# Patient Record
Sex: Male | Born: 1955 | Race: White | Hispanic: No | Marital: Married | Smoking: Never smoker
Health system: Southern US, Community
[De-identification: ages and names within clinical notes are randomized; demographics above are authoritative.]

## PROBLEM LIST (undated history)

## (undated) DIAGNOSIS — E119 Type 2 diabetes mellitus without complications: Secondary | ICD-10-CM

## (undated) DIAGNOSIS — Z973 Presence of spectacles and contact lenses: Secondary | ICD-10-CM

## (undated) DIAGNOSIS — T7840XA Allergy, unspecified, initial encounter: Secondary | ICD-10-CM

## (undated) DIAGNOSIS — D649 Anemia, unspecified: Secondary | ICD-10-CM

## (undated) DIAGNOSIS — Z87442 Personal history of urinary calculi: Secondary | ICD-10-CM

## (undated) DIAGNOSIS — M1990 Unspecified osteoarthritis, unspecified site: Secondary | ICD-10-CM

## (undated) DIAGNOSIS — C649 Malignant neoplasm of unspecified kidney, except renal pelvis: Secondary | ICD-10-CM

## (undated) DIAGNOSIS — E785 Hyperlipidemia, unspecified: Secondary | ICD-10-CM

## (undated) DIAGNOSIS — M5136 Other intervertebral disc degeneration, lumbar region: Secondary | ICD-10-CM

## (undated) DIAGNOSIS — K760 Fatty (change of) liver, not elsewhere classified: Secondary | ICD-10-CM

## (undated) HISTORY — DX: Unspecified osteoarthritis, unspecified site: M19.90

## (undated) HISTORY — DX: Hyperlipidemia, unspecified: E78.5

## (undated) HISTORY — DX: Other intervertebral disc degeneration, lumbar region: M51.36

## (undated) HISTORY — DX: Personal history of urinary calculi: Z87.442

## (undated) HISTORY — PX: COLONOSCOPY: SHX174

## (undated) HISTORY — DX: Allergy, unspecified, initial encounter: T78.40XA

## (undated) HISTORY — DX: Anemia, unspecified: D64.9

## (undated) HISTORY — PX: KNEE SURGERY: SHX244

## (undated) HISTORY — DX: Fatty (change of) liver, not elsewhere classified: K76.0

## (undated) HISTORY — DX: Type 2 diabetes mellitus without complications: E11.9

## (undated) HISTORY — DX: Malignant neoplasm of unspecified kidney, except renal pelvis: C64.9

---

## 2007-07-09 DIAGNOSIS — C649 Malignant neoplasm of unspecified kidney, except renal pelvis: Secondary | ICD-10-CM

## 2007-07-09 HISTORY — PX: PARTIAL NEPHRECTOMY: SHX414

## 2007-07-09 HISTORY — DX: Malignant neoplasm of unspecified kidney, except renal pelvis: C64.9

## 2008-07-08 DIAGNOSIS — E119 Type 2 diabetes mellitus without complications: Secondary | ICD-10-CM

## 2008-07-08 HISTORY — DX: Type 2 diabetes mellitus without complications: E11.9

## 2010-07-08 LAB — HM COLONOSCOPY

## 2013-06-02 ENCOUNTER — Telehealth: Payer: Self-pay | Admitting: Family Medicine

## 2013-06-02 NOTE — Telephone Encounter (Signed)
Pt is new to the area and wants to est w/you as his PCP.  He needs an appointment after 5:00 if at all possible.  Can you accommodate him a new patient appointment after 5:00 p.m.? Thank you.

## 2013-06-06 NOTE — Telephone Encounter (Signed)
Ok to place in any open slot Thursday after 5pm.  Thanks.

## 2013-06-07 NOTE — Telephone Encounter (Signed)
Called and left mssg for pt to return my call

## 2013-06-09 NOTE — Telephone Encounter (Signed)
Scheduled for 06/24/2013

## 2013-06-09 NOTE — Telephone Encounter (Signed)
Called and left another mssg for pt to return call

## 2013-06-24 ENCOUNTER — Encounter: Payer: Self-pay | Admitting: Family Medicine

## 2013-06-24 ENCOUNTER — Ambulatory Visit (INDEPENDENT_AMBULATORY_CARE_PROVIDER_SITE_OTHER): Payer: BC Managed Care – PPO | Admitting: Family Medicine

## 2013-06-24 VITALS — BP 136/88 | HR 68 | Temp 98.1°F | Ht 67.0 in | Wt 204.5 lb

## 2013-06-24 DIAGNOSIS — E1140 Type 2 diabetes mellitus with diabetic neuropathy, unspecified: Secondary | ICD-10-CM | POA: Insufficient documentation

## 2013-06-24 DIAGNOSIS — C641 Malignant neoplasm of right kidney, except renal pelvis: Secondary | ICD-10-CM

## 2013-06-24 DIAGNOSIS — E1169 Type 2 diabetes mellitus with other specified complication: Secondary | ICD-10-CM | POA: Insufficient documentation

## 2013-06-24 DIAGNOSIS — C649 Malignant neoplasm of unspecified kidney, except renal pelvis: Secondary | ICD-10-CM

## 2013-06-24 DIAGNOSIS — E785 Hyperlipidemia, unspecified: Secondary | ICD-10-CM

## 2013-06-24 DIAGNOSIS — E119 Type 2 diabetes mellitus without complications: Secondary | ICD-10-CM

## 2013-06-24 DIAGNOSIS — IMO0002 Reserved for concepts with insufficient information to code with codable children: Secondary | ICD-10-CM | POA: Insufficient documentation

## 2013-06-24 DIAGNOSIS — Z87442 Personal history of urinary calculi: Secondary | ICD-10-CM | POA: Insufficient documentation

## 2013-06-24 DIAGNOSIS — Z85528 Personal history of other malignant neoplasm of kidney: Secondary | ICD-10-CM | POA: Insufficient documentation

## 2013-06-24 NOTE — Assessment & Plan Note (Signed)
Chronic, seems stable.  Foot exam today. May be due for eye exam. Pt interested in DMSE - will refer. Reviewed healthy diet choices. Return in 4 mo for f/u.

## 2013-06-24 NOTE — Patient Instructions (Signed)
Blood work today. We will call you to set up diabetes education classes. Good to see you today, call us with questions. Return as needed or in 4 months for follow up diabetes.

## 2013-06-24 NOTE — Assessment & Plan Note (Signed)
Did not tolerate lipitor - will check FLP today and discuss other treatment options.

## 2013-06-24 NOTE — Assessment & Plan Note (Signed)
On allopurinol 

## 2013-06-24 NOTE — Assessment & Plan Note (Signed)
Continue to follow with urology.  Pt has appt 07/06/2013 back in [place].  I have asked him to send me fax of last urology office note.

## 2013-06-24 NOTE — Progress Notes (Signed)
Subjective:    Patient ID: Blake Manning, male    DOB: 13-Jul-1955, 57 y.o.   MRN: [id number]  HPI CC: new pt to establish  Recently relocated to area from [facility]. Prior PCP Dr. Pennie Rushing in [place].  Renal cancer - sees urologist Dr. Luellen Pucker in [place], [place].  Dx 2009 s/p partial right nephrectomy without residual issues.  5 yr appt is later this year.    T2DM - unsure what last A1c was.  Last check was 1 month ago.  Last foot exam several months ago.  Last eye exam 2013.  Compliant with januvmet bid.  Checks sugars intermittently (once a week - last check was fasting 120-130).  + parsethesias in feet.  lyrica caused body soreness.  HLD - lipitor caused bone aches.  H/o kidney stone - last stone 2009.  Has residual kidney stones in right side.  On allopurinol to help decrease risk of recurrence  Preventative: Late 2014. Colonoscopy 2012 Flu shot - today. Tetanus shot - 2011.  Lives with wife, 1 dog Grown children Occupation: Retail banker Edu: HS, going to join local gym Diet: good water, fruits/vegetables daily  Medications and allergies reviewed and updated in chart.  Past histories reviewed and updated if relevant as below. There are no active problems to display for this patient.  Past Medical History  Diagnosis Date  . Renal cancer 2009    part of R kidney removed  . Diabetes type 2, controlled 2010  . Hyperlipidemia   . History of kidney stones    Past Surgical History  Procedure Laterality Date  . Partial nephrectomy Right 2009   History  Substance Use Topics  . Smoking status: Never Smoker   . Smokeless tobacco: Never Used  . Alcohol Use: No   Family History  Problem Relation Age of Onset  . Cancer Mother     lung (smoker)  . Diabetes Mother   . Cancer Maternal Aunt     lung (smoker)  . Cancer Maternal Aunt     lung with met to brain (smoker)  . Valvular heart disease Son 30    extra heart valves s/p recent procedure  .  Stroke Neg Hx   . Hypertension Son   . Hyperlipidemia Son    Allergies  Allergen Reactions  . Lipitor [Atorvastatin] Other (See Comments)    Bone aches  . Codeine Palpitations   No current outpatient prescriptions on file prior to visit.   No current facility-administered medications on file prior to visit.    Review of Systems Per HPI    Objective:   Physical Exam  Nursing note and vitals reviewed. Constitutional: He is oriented to person, place, and time. He appears well-developed and well-nourished. No distress.  HENT:  Head: Normocephalic and atraumatic.  Nose: Nose normal.  Mouth/Throat: Oropharynx is clear and moist. No oropharyngeal exudate.  Eyes: Conjunctivae and EOM are normal. Pupils are equal, round, and reactive to light. No scleral icterus.  Neck: Normal range of motion. Neck supple.  Cardiovascular: Normal rate, regular rhythm, normal heart sounds and intact distal pulses.   No murmur heard. Pulses:      Radial pulses are 2+ on the right side, and 2+ on the left side.  Pulmonary/Chest: Effort normal and breath sounds normal. No respiratory distress. He has no wheezes. He has no rales.  Abdominal: Soft. Bowel sounds are normal. He exhibits no distension and no mass. There is no tenderness. There is no rebound and no guarding.  Musculoskeletal: Normal range of motion. He exhibits no edema.  Diabetic foot exam: Normal inspection No skin breakdown Early calluses on right  Normal DP/PT pulses Normal sensation to monofilament slightly decreased to light touch on right great toe Nails normal  Lymphadenopathy:    He has no cervical adenopathy.  Neurological: He is alert and oriented to person, place, and time.  CN grossly intact, station and gait intact  Skin: Skin is warm and dry. No rash noted.  Psychiatric: He has a normal mood and affect. His behavior is normal. Judgment and thought content normal.        Assessment & Plan:

## 2013-06-24 NOTE — Progress Notes (Signed)
Pre-visit discussion using our clinic review tool. No additional management support is needed unless otherwise documented below in the visit note.  

## 2013-06-25 LAB — BASIC METABOLIC PANEL
BUN: 13 mg/dL (ref 6–23)
CO2: 24 mEq/L (ref 19–32)
Calcium: 9.5 mg/dL (ref 8.4–10.5)
Chloride: 105 mEq/L (ref 96–112)
Creatinine, Ser: 0.8 mg/dL (ref 0.4–1.5)
GFR: 102.16 mL/min (ref >60.00)
Glucose, Bld: 124 mg/dL — ABNORMAL HIGH (ref 70–99)
Potassium: 3.7 mEq/L (ref 3.5–5.1)
Sodium: 138 mEq/L (ref 135–145)

## 2013-06-25 LAB — LIPID PANEL
Cholesterol: 209 mg/dL — ABNORMAL HIGH (ref 0–200)
HDL: 44 mg/dL (ref >39.00)
Total CHOL/HDL Ratio: 5
Triglycerides: 295 mg/dL — ABNORMAL HIGH (ref 0.0–149.0)
VLDL: 59 mg/dL — ABNORMAL HIGH (ref 0.0–40.0)

## 2013-06-25 LAB — LDL CHOLESTEROL, DIRECT: Direct LDL: 131.5 mg/dL

## 2013-06-25 LAB — HEMOGLOBIN A1C: Hgb A1c MFr Bld: 8.1 % — ABNORMAL HIGH (ref 4.6–6.5)

## 2013-06-30 ENCOUNTER — Other Ambulatory Visit: Payer: Self-pay | Admitting: Family Medicine

## 2013-06-30 MED ORDER — PRAVASTATIN SODIUM 40 MG PO TABS: 40.0000 mg | ORAL_TABLET | Freq: Every day | ORAL | Status: DC

## 2013-08-26 ENCOUNTER — Telehealth: Payer: Self-pay | Admitting: *Deleted

## 2013-08-26 NOTE — Telephone Encounter (Signed)
PA for test strips on your IN box for completion.

## 2013-08-27 ENCOUNTER — Telehealth: Payer: Self-pay

## 2013-08-27 MED ORDER — GLUCOSE BLOOD VI STRP: ORAL_STRIP | Status: DC

## 2013-08-27 NOTE — Telephone Encounter (Signed)
signed and placed in my out box 

## 2013-08-27 NOTE — Telephone Encounter (Signed)
Pt request new rx for contour test strips to [facility]. Dr Darnell Level gave verbal order to add to med list and refill. Mrs [facility] notified done.

## 2013-08-28 ENCOUNTER — Encounter: Payer: Self-pay | Admitting: Family Medicine

## 2013-09-03 NOTE — Telephone Encounter (Signed)
I called ins to check the status on PA. Was advised that test strips were approved . Case ID [id number]. PA forms sent to be scanned, and parmacy notified via fax.

## 2013-09-07 ENCOUNTER — Telehealth: Payer: Self-pay

## 2013-09-07 NOTE — Telephone Encounter (Signed)
Ok to send in. Thanks

## 2013-09-07 NOTE — Telephone Encounter (Signed)
Pt left note; insurance will no longer cover contour diabetic supplies; pt request  accu-chek comfort curv a long with test strips to [facility].Please advise.

## 2013-09-08 ENCOUNTER — Other Ambulatory Visit: Payer: Self-pay | Admitting: *Deleted

## 2013-09-08 MED ORDER — GLUCOSE BLOOD VI STRP: ORAL_STRIP | Status: DC

## 2013-09-08 MED ORDER — BLOOD GLUCOSE METER KIT: PACK | Status: DC

## 2013-09-08 NOTE — Telephone Encounter (Signed)
Sent in as directed.

## 2013-09-14 ENCOUNTER — Encounter: Payer: BC Managed Care – PPO | Admitting: Family Medicine

## 2013-09-21 ENCOUNTER — Ambulatory Visit (INDEPENDENT_AMBULATORY_CARE_PROVIDER_SITE_OTHER): Payer: BC Managed Care – PPO | Admitting: Family Medicine

## 2013-09-21 VITALS — BP 142/88 | HR 76 | Temp 97.7°F | Ht 67.0 in | Wt 202.4 lb

## 2013-09-21 DIAGNOSIS — E119 Type 2 diabetes mellitus without complications: Secondary | ICD-10-CM

## 2013-09-21 DIAGNOSIS — C649 Malignant neoplasm of unspecified kidney, except renal pelvis: Secondary | ICD-10-CM

## 2013-09-21 DIAGNOSIS — E785 Hyperlipidemia, unspecified: Secondary | ICD-10-CM

## 2013-09-21 DIAGNOSIS — Z0000 Encounter for general adult medical examination without abnormal findings: Secondary | ICD-10-CM | POA: Insufficient documentation

## 2013-09-21 NOTE — Assessment & Plan Note (Signed)
Preventative protocols reviewed and updated unless pt declined. Discussed healthy diet and lifestyle.  

## 2013-09-21 NOTE — Patient Instructions (Addendum)
Look for red yeast rice over the counter for cholesterol levels.  Start at 600mg  once daily.  If tolerating well may increase to 600mg  twice daily. Return Friday morning fasting for blood work - black coffee ok or water. Good to see you today, call us with questions. We'll give you a call with A1c and diabetes plan.

## 2013-09-21 NOTE — Progress Notes (Signed)
Pre visit review using our clinic review tool, if applicable. No additional management support is needed unless otherwise documented below in the visit note. 

## 2013-09-21 NOTE — Assessment & Plan Note (Signed)
Did not tolerate lipitor or pravastatin. Check FLP again ,recommend trial of RYR.

## 2013-09-21 NOTE — Assessment & Plan Note (Signed)
Chronic, recheck A1c when returns fasting for blood work. Did not keep appt with DSME.

## 2013-09-21 NOTE — Assessment & Plan Note (Signed)
Stable, followed by urology.  

## 2013-09-21 NOTE — Progress Notes (Signed)
BP 142/88  Pulse 76  Temp(Src) 97.7 F (36.5 C) (Oral)  Ht _0  (1.702 m)  Wt 202 lb 6.4 oz (91.808 kg)  BMI 31.69 kg/m2   CC: annual exam  Subjective:    Patient ID: Blake Manning, male    DOB: [birth year], 58 y.o.   MRN: [id number]  HPI: Blake Manning is a 58 y.o. male presenting on 09/21/2013 for Annual Exam   HLD - did not tolerate lipitor or pravastatin - body aches.  DM - A1c again today.  compliant with janumet 50/500 bid.  Checks sugars fasting daily.  Sugars averaging 160-180. Lab Results  Component Value Date   HGBA1C 8.1* 06/24/2013    Seat belt use discussed Sunscreen use discussed, no sunburns in past year.  Preventative: Colonoscopy 2012  Prostate cancer screening - done by urologist who he sees for h/o kidney cancer. Flu shot - did not receive Tetanus shot - 2011.  Pneumovax - today.  Lives with wife, 1 dog  Grown children  Occupation: Armed forces technical officer  Edu: HS Activity: going to join local gym Diet: good water, fruits/vegetables daily   Relevant past medical, surgical, family and social history reviewed and updated as indicated.  Allergies and medications reviewed and updated. Current Outpatient Prescriptions on File Prior to Visit  Medication Sig  . allopurinol (ZYLOPRIM) 300 MG tablet Take 300 mg by mouth daily.  . Blood Glucose Monitoring Suppl (BLOOD GLUCOSE METER) kit Accu-Chek Comfort Curve Dx: 250.00  Use to check sugar once daily  . glucose blood (ACCU-CHEK COMFORT CURVE) test strip Use to check blood sugar once daily. Dx: 250.00  . sitaGLIPtin-metformin (JANUMET) 50-500 MG per tablet Take 1 tablet by mouth 2 (two) times daily with a meal.   No current facility-administered medications on file prior to visit.    Review of Systems  Constitutional: Negative for fever, chills, activity change, appetite change, fatigue and unexpected weight change.  HENT: Negative for hearing loss.   Eyes: Negative for visual disturbance.    Respiratory: Negative for cough, chest tightness, shortness of breath and wheezing.   Cardiovascular: Negative for chest pain, palpitations and leg swelling.  Gastrointestinal: Negative for nausea, vomiting, abdominal pain, diarrhea, constipation, blood in stool and abdominal distention.  Genitourinary: Negative for hematuria and difficulty urinating.  Musculoskeletal: Negative for arthralgias, myalgias and neck pain.  Skin: Negative for rash.  Neurological: Negative for dizziness, seizures, syncope and headaches.  Hematological: Negative for adenopathy. Does not bruise/bleed easily.  Psychiatric/Behavioral: Negative for dysphoric mood. The patient is not nervous/anxious.    Per HPI unless specifically indicated above    Objective:    BP 142/88  Pulse 76  Temp(Src) 97.7 F (36.5 C) (Oral)  Ht _1  (1.702 m)  Wt 202 lb 6.4 oz (91.808 kg)  BMI 31.69 kg/m2  Physical Exam  Nursing note and vitals reviewed. Constitutional: He is oriented to person, place, and time. He appears well-developed and well-nourished. No distress.  HENT:  Head: Normocephalic and atraumatic.  Mouth/Throat: Uvula is midline, oropharynx is clear and moist and mucous membranes are normal. No oropharyngeal exudate, posterior oropharyngeal edema or posterior oropharyngeal erythema.  Eyes: Conjunctivae and EOM are normal. Pupils are equal, round, and reactive to light. No scleral icterus.  Neck: Normal range of motion. Neck supple. No thyromegaly present.  Cardiovascular: Normal rate, regular rhythm, normal heart sounds and intact distal pulses.   No murmur heard. Pulses:      Radial pulses are 2+ on the right side, and  2+ on the left side.  Pulmonary/Chest: Effort normal and breath sounds normal. No respiratory distress. He has no wheezes. He has no rales.  Abdominal: Soft. Bowel sounds are normal. He exhibits no distension and no mass. There is no tenderness. There is no rebound and no guarding.  Genitourinary:   Per urology  Musculoskeletal: Normal range of motion. He exhibits no edema.  Lymphadenopathy:    He has no cervical adenopathy.  Neurological: He is alert and oriented to person, place, and time.  CN grossly intact, station and gait intact  Skin: Skin is warm and dry. No rash noted.  Psychiatric: He has a normal mood and affect. His behavior is normal. Judgment and thought content normal.   Results for orders placed in visit on 06/24/13  LIPID PANEL      Result Value Ref Range   Cholesterol 209 (*) 0 - 200 mg/dL   Triglycerides 295.0 (*) 0.0 - 149.0 mg/dL   HDL 44.00  >39.00 mg/dL   VLDL 59.0 (*) 0.0 - 40.0 mg/dL   Total CHOL/HDL Ratio 5    BASIC METABOLIC PANEL      Result Value Ref Range   Sodium 138  135 - 145 mEq/L   Potassium 3.7  3.5 - 5.1 mEq/L   Chloride 105  96 - 112 mEq/L   CO2 24  19 - 32 mEq/L   Glucose, Bld 124 (*) 70 - 99 mg/dL   BUN 13  6 - 23 mg/dL   Creatinine, Ser 0.8  0.4 - 1.5 mg/dL   Calcium 9.5  8.4 - 10.5 mg/dL   GFR 102.16  >60.00 mL/min  HEMOGLOBIN A1C      Result Value Ref Range   Hemoglobin A1C 8.1 (*) 4.6 - 6.5 %  LDL CHOLESTEROL, DIRECT      Result Value Ref Range   Direct LDL 131.5        Assessment & Plan:   Problem List Items Addressed This Visit   Diabetes type 2, controlled     Chronic, recheck A1c when returns fasting for blood work. Did not keep appt with DSME.    Relevant Orders      Hemoglobin A1c      Basic metabolic panel   Health maintenance examination - Primary     Preventative protocols reviewed and updated unless pt declined. Discussed healthy diet and lifestyle.     Relevant Orders      Nicotine/cotinine metabolites   Hyperlipidemia     Did not tolerate lipitor or pravastatin. Check FLP again ,recommend trial of RYR.    Relevant Orders      Lipid panel   Renal cancer     Stable, followed by urology.        Follow up plan: Return in about 4 months (around 01/21/2014), or as needed, for follow up, follow up  visit.

## 2013-09-23 ENCOUNTER — Encounter: Payer: Self-pay | Admitting: Family Medicine

## 2013-09-24 ENCOUNTER — Other Ambulatory Visit (INDEPENDENT_AMBULATORY_CARE_PROVIDER_SITE_OTHER): Payer: BC Managed Care – PPO

## 2013-09-24 DIAGNOSIS — E785 Hyperlipidemia, unspecified: Secondary | ICD-10-CM

## 2013-09-24 DIAGNOSIS — E119 Type 2 diabetes mellitus without complications: Secondary | ICD-10-CM

## 2013-09-24 DIAGNOSIS — Z0000 Encounter for general adult medical examination without abnormal findings: Secondary | ICD-10-CM

## 2013-09-24 LAB — BASIC METABOLIC PANEL
BUN: 12 mg/dL (ref 6–23)
CO2: 28 mEq/L (ref 19–32)
Calcium: 9.6 mg/dL (ref 8.4–10.5)
Chloride: 102 mEq/L (ref 96–112)
Creatinine, Ser: 0.9 mg/dL (ref 0.4–1.5)
GFR: 92.96 mL/min (ref >60.00)
Glucose, Bld: 170 mg/dL — ABNORMAL HIGH (ref 70–99)
Potassium: 4.1 mEq/L (ref 3.5–5.1)
Sodium: 137 mEq/L (ref 135–145)

## 2013-09-24 LAB — LIPID PANEL
Cholesterol: 169 mg/dL (ref 0–200)
HDL: 47.1 mg/dL (ref >39.00)
LDL Cholesterol: 97 mg/dL (ref 0–99)
Total CHOL/HDL Ratio: 4
Triglycerides: 126 mg/dL (ref 0.0–149.0)
VLDL: 25.2 mg/dL (ref 0.0–40.0)

## 2013-09-24 LAB — HEMOGLOBIN A1C: Hgb A1c MFr Bld: 8.8 % — ABNORMAL HIGH (ref 4.6–6.5)

## 2013-09-27 LAB — NICOTINE/COTININE METABOLITES: Cotinine: <10 ng/mL

## 2013-09-30 ENCOUNTER — Telehealth: Payer: Self-pay

## 2013-09-30 NOTE — Telephone Encounter (Signed)
Relevant patient education assigned to patient using Emmi. ° °

## 2013-10-04 ENCOUNTER — Other Ambulatory Visit: Payer: Self-pay | Admitting: Family Medicine

## 2013-10-04 MED ORDER — GLIMEPIRIDE 1 MG PO TABS: 1.0000 mg | ORAL_TABLET | Freq: Every day | ORAL | Status: DC

## 2013-10-11 ENCOUNTER — Encounter: Payer: Self-pay | Admitting: Family Medicine

## 2013-12-08 ENCOUNTER — Encounter: Payer: Self-pay | Admitting: Family Medicine

## 2014-01-05 DIAGNOSIS — K760 Fatty (change of) liver, not elsewhere classified: Secondary | ICD-10-CM

## 2014-01-05 DIAGNOSIS — M51369 Other intervertebral disc degeneration, lumbar region without mention of lumbar back pain or lower extremity pain: Secondary | ICD-10-CM

## 2014-01-05 DIAGNOSIS — M5136 Other intervertebral disc degeneration, lumbar region: Secondary | ICD-10-CM

## 2014-01-05 HISTORY — DX: Other intervertebral disc degeneration, lumbar region: M51.36

## 2014-01-05 HISTORY — DX: Fatty (change of) liver, not elsewhere classified: K76.0

## 2014-01-05 HISTORY — DX: Other intervertebral disc degeneration, lumbar region without mention of lumbar back pain or lower extremity pain: M51.369

## 2014-01-14 ENCOUNTER — Telehealth: Payer: Self-pay | Admitting: *Deleted

## 2014-01-14 NOTE — Telephone Encounter (Signed)
Lm on pts vm requesting a call back to schedule DIABETIC BUNDLE OV-needing BP check and A1C

## 2014-01-20 ENCOUNTER — Emergency Department: Payer: Self-pay | Admitting: Emergency Medicine

## 2014-01-20 LAB — COMPREHENSIVE METABOLIC PANEL
Albumin: 3.9 g/dL (ref 3.4–5.0)
Alkaline Phosphatase: 66 U/L
Anion Gap: 6 — ABNORMAL LOW (ref 7–16)
BUN: 16 mg/dL (ref 7–18)
Bilirubin,Total: 0.4 mg/dL (ref 0.2–1.0)
Calcium, Total: 9 mg/dL (ref 8.5–10.1)
Chloride: 106 mmol/L (ref 98–107)
Co2: 28 mmol/L (ref 21–32)
Creatinine: 1.1 mg/dL (ref 0.60–1.30)
EGFR (African American): >60
EGFR (Non-African Amer.): >60
Glucose: 139 mg/dL — ABNORMAL HIGH (ref 65–99)
Osmolality: 283 (ref 275–301)
Potassium: 3.5 mmol/L (ref 3.5–5.1)
SGOT(AST): 55 U/L — ABNORMAL HIGH (ref 15–37)
SGPT (ALT): 81 U/L — ABNORMAL HIGH (ref 12–78)
Sodium: 140 mmol/L (ref 136–145)
Total Protein: 8 g/dL (ref 6.4–8.2)

## 2014-01-20 LAB — CBC WITH DIFFERENTIAL/PLATELET
Basophil #: 0.1 10*3/uL (ref 0.0–0.1)
Basophil %: 1 %
Eosinophil #: 0.2 10*3/uL (ref 0.0–0.7)
Eosinophil %: 2.1 %
HCT: 39.6 % — ABNORMAL LOW (ref 40.0–52.0)
HGB: 12.4 g/dL — ABNORMAL LOW (ref 13.0–18.0)
Lymphocyte #: 3.9 10*3/uL — ABNORMAL HIGH (ref 1.0–3.6)
Lymphocyte %: 39.4 %
MCH: 22.5 pg — ABNORMAL LOW (ref 26.0–34.0)
MCHC: 31.4 g/dL — ABNORMAL LOW (ref 32.0–36.0)
MCV: 72 fL — ABNORMAL LOW (ref 80–100)
Monocyte #: 1 x10 3/mm (ref 0.2–1.0)
Monocyte %: 10.4 %
Neutrophil #: 4.7 10*3/uL (ref 1.4–6.5)
Neutrophil %: 47.1 %
Platelet: 210 10*3/uL (ref 150–440)
RBC: 5.53 10*6/uL (ref 4.40–5.90)
RDW: 15.1 % — ABNORMAL HIGH (ref 11.5–14.5)
WBC: 10 10*3/uL (ref 3.8–10.6)

## 2014-01-21 LAB — URINALYSIS, COMPLETE
Bilirubin,UR: NEGATIVE
Glucose,UR: >=500 mg/dL (ref 0–75)
Leukocyte Esterase: NEGATIVE
Nitrite: NEGATIVE
Ph: 5 (ref 4.5–8.0)
Protein: 30
RBC,UR: 479 /HPF (ref 0–5)
Specific Gravity: 1.028 (ref 1.003–1.030)
Squamous Epithelial: <1
WBC UR: 3 /HPF (ref 0–5)

## 2014-01-24 ENCOUNTER — Ambulatory Visit (INDEPENDENT_AMBULATORY_CARE_PROVIDER_SITE_OTHER): Payer: BC Managed Care – PPO | Admitting: Family Medicine

## 2014-01-24 ENCOUNTER — Encounter: Payer: Self-pay | Admitting: Family Medicine

## 2014-01-24 VITALS — BP 112/68 | HR 68 | Temp 98.1°F | Wt 205.8 lb

## 2014-01-24 DIAGNOSIS — N200 Calculus of kidney: Secondary | ICD-10-CM | POA: Insufficient documentation

## 2014-01-24 DIAGNOSIS — K760 Fatty (change of) liver, not elsewhere classified: Secondary | ICD-10-CM

## 2014-01-24 DIAGNOSIS — K7689 Other specified diseases of liver: Secondary | ICD-10-CM

## 2014-01-24 DIAGNOSIS — M5136 Other intervertebral disc degeneration, lumbar region: Secondary | ICD-10-CM

## 2014-01-24 DIAGNOSIS — E119 Type 2 diabetes mellitus without complications: Secondary | ICD-10-CM

## 2014-01-24 DIAGNOSIS — M5137 Other intervertebral disc degeneration, lumbosacral region: Secondary | ICD-10-CM

## 2014-01-24 LAB — POCT URINALYSIS DIPSTICK
Bilirubin, UA: NEGATIVE
Glucose, UA: NEGATIVE
Ketones, UA: NEGATIVE
Leukocytes, UA: NEGATIVE
Nitrite, UA: NEGATIVE
Spec Grav, UA: 1.02
Urobilinogen, UA: 0.2
pH, UA: 6

## 2014-01-24 MED ORDER — TAMSULOSIN HCL 0.4 MG PO CAPS
0.4000 mg | ORAL_CAPSULE | Freq: Every day | ORAL | Status: DC
Start: 2014-01-24 — End: 2014-06-01

## 2014-01-24 MED ORDER — OXYCODONE-ACETAMINOPHEN 10-325 MG PO TABS: 1.0000 | ORAL_TABLET | Freq: Four times a day (QID) | ORAL | Status: DC | PRN

## 2014-01-24 MED ORDER — GLIMEPIRIDE 2 MG PO TABS: 2.0000 mg | ORAL_TABLET | Freq: Every day | ORAL | Status: DC

## 2014-01-24 MED ORDER — NAPROXEN 500 MG PO TABS: ORAL_TABLET | ORAL | Status: DC

## 2014-01-24 NOTE — Assessment & Plan Note (Signed)
Improved but still not controlled based on wife's recall CBGs. Increase amaryl to 2mg  daily. Pt/wife agree with plan. rtc 2-3 mo for DM f/u.

## 2014-01-24 NOTE — Progress Notes (Signed)
Pre visit review using our clinic review tool, if applicable. No additional management support is needed unless otherwise documented below in the visit note. 

## 2014-01-24 NOTE — Assessment & Plan Note (Addendum)
[facility] records requested for review.  Pt reports 66mm L ureteral stone and has h/o uric acid stones. Continue percocets (prescribed 10/325mg  #30), continue flomax 0.4 daily, zofran prn and start naprosyn 500mg  bid x 5 days. Known h/o kidney cancer s/p partial nephrectomy, but I feel short course of NSAID benefits outweighs risks. Strainer provided today. Endorsed multiple kidney stones. Given this and his renal history, will refer to establish with local urologist.

## 2014-01-24 NOTE — Patient Instructions (Addendum)
For sugars - let's increase amaryl to 2 mg daily (new dose is at pharmacy). For kidney stones - let's continue pushing fluids. Let's continue percocets for breakthrough pain (10mg  dose printed today) and flomax 1 tablet daily (i've refilled both of these today). I'd like to start short course of anti inflammatory naprosyn 500mg  twice daily with food - sent to pharmacy. We will refer you to local urologist [facility]) over next few weeks. Watch for fever >101, worsening pain instead of improving each day, or worsening nausea/vomiting. If this happens, let me know. Schedule diabetes follow up appointment in next few months.

## 2014-01-24 NOTE — Progress Notes (Addendum)
BP 112/68  Pulse 68  Temp(Src) 98.1 F (36.7 C) (Oral)  Wt 205 lb 12 oz (93.328 kg)   CC: kidney stone  Subjective:    Patient ID: Blake Manning, male    DOB: [birth year], 58 y.o.   MRN: [id number]  HPI: Blake Manning is a 58 y.o. male presenting on 01/24/2014 for DX with kidney stone   Pleasant 58 yo with h/o uncontrolled T2DM, uric acid kidney stones, renal cancer s/p surgery presents with ~1wk h/o worsening pain. Seen at [facility] ER Thursday night and treated with percocets and zofran. Took 2 percocets at 8am this morning. Woke up Thursday night with chills, L flank pain with radiation to groin. Intermittent stabbing throbbing pain. [place] had CT scan - 48mm stone close to bladder.  Last kidney stone was 2009. H/o uric acid stones.  DM - sugars running 140-150 fasting. No low sugars. Lab Results  Component Value Date   HGBA1C 8.8* 09/24/2013   Past Medical History  Diagnosis Date  . Renal cancer 2009    part of R kidney removed (Dr. 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 Diabetes type 2, controlled 2010    referred for DSME (05/2013) but pt did not return phone call  . Hyperlipidemia   . History of kidney stones     uric acid   Relevant past medical, surgical, family and social history reviewed and updated as indicated.  Allergies and medications reviewed and updated. Current Outpatient Prescriptions on File Prior to Visit  Medication Sig  . allopurinol (ZYLOPRIM) 300 MG tablet Take 300 mg by mouth daily.  . Blood Glucose Monitoring Suppl (BLOOD GLUCOSE METER) kit Accu-Chek Comfort Curve Dx: 250.00  Use to check sugar once daily  . glucose blood (ACCU-CHEK COMFORT CURVE) test strip Use to check blood sugar once daily. Dx: 250.00  . Red Yeast Rice 600 MG CAPS Take 1 capsule by mouth 2 (two) times daily.  . sitaGLIPtin-metformin (JANUMET) 50-500 MG per tablet Take 1 tablet by mouth 2 (two) times daily with a meal.   No current  facility-administered medications on file prior to visit.   Past Medical History  Diagnosis Date  . Renal cancer 2009    part of R kidney removed (Dr. 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 Diabetes type 2, controlled 2010    referred for DSME (05/2013) but pt did not return phone call  . Hyperlipidemia   . History of kidney stones     uric acid   Past Surgical History  Procedure Laterality Date  . Partial nephrectomy Right 2009    RCC, negative margins    Review of Systems Per HPI unless specifically indicated above    Objective:    BP 112/68  Pulse 68  Temp(Src) 98.1 F (36.7 C) (Oral)  Wt 205 lb 12 oz (93.328 kg)  Physical Exam  Nursing note and vitals reviewed. Constitutional: He appears well-developed and well-nourished. No distress.  HENT:  Mouth/Throat: Oropharynx is clear and moist. No oropharyngeal exudate.  Abdominal: Soft. Normal appearance and bowel sounds are normal. He exhibits no distension. There is no hepatosplenomegaly. There is tenderness (mild) in the right lower quadrant and left lower quadrant. There is no rigidity, no rebound, no guarding, no CVA tenderness and negative Murphy's sign.  Psychiatric: He has a normal mood and affect.   Results for orders placed in visit on 01/24/14  POCT URINALYSIS DIPSTICK  Result Value Ref Range   Color, UA yellow     Clarity, UA clear     Glucose, UA negative     Bilirubin, UA negative     Ketones, UA negative     Spec Grav, UA 1.020     Blood, UA trace     pH, UA 6.0     Protein, UA trace     Urobilinogen, UA 0.2     Nitrite, UA negative     Leukocytes, UA Negative        Assessment & Plan:   Problem List Items Addressed This Visit   Kidney stone - Primary     [facility] records requested for review.  Pt reports 17mm L ureteral stone and has h/o uric acid stones. Continue percocets (prescribed 10/325mg  #30), continue flomax 0.4 daily, zofran prn and start naprosyn  500mg  bid x 5 days. Known h/o kidney cancer s/p partial nephrectomy, but I feel short course of NSAID benefits outweighs risks. Strainer provided today. Endorsed multiple kidney stones. Given this and his renal history, will refer to establish with local urologist.    Relevant Medications      OXYCODONE-ACETAMINOPHEN 10-325 MG PO TABS   Other Relevant Orders      Urinalysis Dipstick (Completed)      Ambulatory referral to Urology   Diabetes type 2, controlled     Improved but still not controlled based on wife's recall CBGs. Increase amaryl to 2mg  daily. Pt/wife agree with plan. rtc 2-3 mo for DM f/u.    Relevant Medications      glimepiride (AMARYL) tablet       Follow up plan: Return if symptoms worsen or fail to improve.   ADDENDUM ==> received, reviewed records from [facility] ER: UA TNTC RBC, WBC 10, Hgb 12.4, plt 210, glu 139, Cr 1.1, AST 55 (H), ALT 81 (H) noncontrast CT scan - 3 mm and 92mm L kidney stones, minimal L hydroureteronephrosis at site of 3 mm calculus. S/p partial R nephrectomy. R 16mm kidney stone. Fatty liver with scattered granulomata. Treated with IV Dilaudid and toradol. Lab Results  Component Value Date   CREATININE 0.9 09/24/2013

## 2014-01-25 ENCOUNTER — Telehealth: Payer: Self-pay

## 2014-01-25 ENCOUNTER — Encounter: Payer: Self-pay | Admitting: Family Medicine

## 2014-01-25 NOTE — Telephone Encounter (Signed)
Thank.  Routed to PCP as FYI.

## 2014-01-25 NOTE — Telephone Encounter (Signed)
Could place in 12:30 slot if needed.

## 2014-01-25 NOTE — Telephone Encounter (Signed)
Will also see if we can expedite urology appointment. Rosaria Ferries can we call for next available appt with [facility]? Would also offer recheck in office tomorrow esp if fever present to eval for infection (not present yesterday) Agree with stopping naprosyn

## 2014-01-25 NOTE — Telephone Encounter (Signed)
Spoke with patient's wife. She went and bought thermometer. Temp was 98.2. Extra half of percocet eased pain to 7. Advised to go to ER tonight if pain worsens. Advised to call me in the morning if no better for recheck. Let me know where to add him. Thanks!

## 2014-01-25 NOTE — Telephone Encounter (Signed)
Mrs Blasco said pt was seen 01/24/14 with kidney stone; has not passed stone, pt drinking fluids and urinating OK. Does not have thermometer but pt feels warm on and off and now pt feels clammy. Lt lower abdominal and back pain has worsened since 01/24/14 and pt is nauseated due to pain but no vomiting.Percocet is not improving pain level. Naprosyn caused pt to see spots before his eyes; pt stopped taking Naprosyn.pt taking flomax daily. Dr Damita Dunnings advised should get thermometer if fever > 100.4 pt should go to ED for eval. Pt can increase Percocet 10-325 mg to 1 1/2 tabs by mouth q6h prn for pain. If increase in pain med does not help pain level pt should go to ED for eval. If increasing Percocet to 1 1/2 tabs q6h prn for pain decreases pts level of pain and pts temp does not go > than 100.4 then call Dr Darnell Level on 01/26/14 to update condition. Mrs [facility] voiced understanding.

## 2014-01-26 NOTE — Telephone Encounter (Signed)
Spoke with patient's wife this AM- he has no fever and the pain is tolerable now. He is a little nauseated and hasn't had a BM in a few days. I advised it was probably due to the pain meds and advised to continue plenty of water and try docusate 100 mg once to twice daily PRN and call me if no improvement. She verbalized understanding. She declined appt this afternoon since things are tolerable and will await call from [facility].

## 2014-01-27 ENCOUNTER — Ambulatory Visit: Payer: BC Managed Care – PPO | Admitting: Family Medicine

## 2014-01-27 ENCOUNTER — Telehealth: Payer: Self-pay | Admitting: Family Medicine

## 2014-01-27 NOTE — Telephone Encounter (Signed)
Will see then. When is urology appt? Also, can we get copy of CT report again as it is currently out for scanning

## 2014-01-27 NOTE — Telephone Encounter (Signed)
Pt wife called and made appt for Blake Manning tonight at 79 pm. She wants you to look over the CT scan again and make sure the dx was correct bc the patient is in so much pain and symptoms are all over the place. Please advise.

## 2014-01-27 NOTE — Telephone Encounter (Signed)
Per [place]-seeing Dr. Yves Dill today at 1:30. She cancelled the appt with you. CT in your IN box.

## 2014-02-15 LAB — HM DIABETES EYE EXAM

## 2014-02-16 ENCOUNTER — Encounter: Payer: Self-pay | Admitting: Family Medicine

## 2014-02-23 ENCOUNTER — Other Ambulatory Visit: Payer: Self-pay | Admitting: *Deleted

## 2014-02-23 MED ORDER — SITAGLIPTIN PHOS-METFORMIN HCL 50-500 MG PO TABS: 1.0000 | ORAL_TABLET | Freq: Two times a day (BID) | ORAL | Status: DC

## 2014-04-13 ENCOUNTER — Ambulatory Visit: Payer: BC Managed Care – PPO | Admitting: Family Medicine

## 2014-04-13 DIAGNOSIS — Z0289 Encounter for other administrative examinations: Secondary | ICD-10-CM

## 2014-06-01 ENCOUNTER — Ambulatory Visit (INDEPENDENT_AMBULATORY_CARE_PROVIDER_SITE_OTHER): Payer: BC Managed Care – PPO | Admitting: Family Medicine

## 2014-06-01 ENCOUNTER — Encounter: Payer: Self-pay | Admitting: Family Medicine

## 2014-06-01 VITALS — BP 136/84 | HR 76 | Temp 98.4°F | Wt 211.0 lb

## 2014-06-01 DIAGNOSIS — E1140 Type 2 diabetes mellitus with diabetic neuropathy, unspecified: Secondary | ICD-10-CM

## 2014-06-01 DIAGNOSIS — E1165 Type 2 diabetes mellitus with hyperglycemia: Secondary | ICD-10-CM

## 2014-06-01 DIAGNOSIS — IMO0002 Reserved for concepts with insufficient information to code with codable children: Secondary | ICD-10-CM

## 2014-06-01 DIAGNOSIS — Z23 Encounter for immunization: Secondary | ICD-10-CM

## 2014-06-01 LAB — BASIC METABOLIC PANEL
BUN: 11 mg/dL (ref 6–23)
CO2: 26 mEq/L (ref 19–32)
Calcium: 9.2 mg/dL (ref 8.4–10.5)
Chloride: 101 mEq/L (ref 96–112)
Creatinine, Ser: 0.9 mg/dL (ref 0.4–1.5)
GFR: 88.19 mL/min (ref >60.00)
Glucose, Bld: 158 mg/dL — ABNORMAL HIGH (ref 70–99)
Potassium: 4.1 mEq/L (ref 3.5–5.1)
Sodium: 136 mEq/L (ref 135–145)

## 2014-06-01 LAB — MICROALBUMIN / CREATININE URINE RATIO
Creatinine,U: 184.2 mg/dL
Microalb Creat Ratio: 2 mg/g (ref 0.0–30.0)
Microalb, Ur: 3.7 mg/dL — ABNORMAL HIGH (ref 0.0–1.9)

## 2014-06-01 LAB — HEMOGLOBIN A1C: Hgb A1c MFr Bld: 7.8 % — ABNORMAL HIGH (ref 4.6–6.5)

## 2014-06-01 MED ORDER — GABAPENTIN 100 MG PO CAPS: 100.0000 mg | ORAL_CAPSULE | Freq: Every day | ORAL | Status: DC

## 2014-06-01 NOTE — Progress Notes (Signed)
Pre visit review using our clinic review tool, if applicable. No additional management support is needed unless otherwise documented below in the visit note. 

## 2014-06-01 NOTE — Assessment & Plan Note (Signed)
For neuropathy - trial of gabapentin. Start at 100mg  nightly, may increase to 300mg  nightly and add during day if needed. Discussed common side effects Discussed hypoglycemic episodes and importance of consistent meals and regular schedule for optimal diabetic control.  Discussed need to take amaryl with meals, encouraged regular breakfast intake. Update if this doesn't resolve sxs. Check A1c, microalb and Cr today.

## 2014-06-01 NOTE — Progress Notes (Signed)
BP 136/84 mmHg  Pulse 76  Temp(Src) 98.4 F (36.9 C) (Oral)  Wt 211 lb (95.709 kg)   CC: discuss sugars  Subjective:    Patient ID: Blake Manning, male    DOB: [birth year], 58 y.o.   MRN: [id number]  HPI: Venson Ferencz is a 57 y.o. male presenting on 06/01/2014 for Blood Sugar Problem   DM - regularly does check sugars a few times a week up to 250s. Checks randomly. Compliant with antihyperglycemic regimen which includes: janumet 50/500 bid and amaryl $RemoveBe'2mg'BUFaRYfdW$  daily.  Low sugars to 50-60s and dizzy symptoms. Not consistent with meals. Has skipped breakfast in past. Endorses foot paresthesias with burning pain. Last diabetic eye exam 02/2014. Foot exam 06/2013. Pneumovax: not done yet. Prevnar: not due. Requests gabapentin trial for neuropathy. Lab Results  Component Value Date   HGBA1C 8.8* 09/24/2013   Lab Results  Component Value Date   LDLCALC 97 09/24/2013   Relevant past medical, surgical, family and social history reviewed and updated as indicated.  Allergies and medications reviewed and updated. Current Outpatient Prescriptions on File Prior to Visit  Medication Sig  . allopurinol (ZYLOPRIM) 300 MG tablet Take 300 mg by mouth daily.  . Blood Glucose Monitoring Suppl (BLOOD GLUCOSE METER) kit Accu-Chek Comfort Curve Dx: 250.00  Use to check sugar once daily  . glimepiride (AMARYL) 2 MG tablet Take 1 tablet (2 mg total) by mouth daily with breakfast.  . glucose blood (ACCU-CHEK COMFORT CURVE) test strip Use to check blood sugar once daily. Dx: 250.00  . sitaGLIPtin-metformin (JANUMET) 50-500 MG per tablet Take 1 tablet by mouth 2 (two) times daily with a meal.  . Red Yeast Rice 600 MG CAPS Take 1 capsule by mouth 2 (two) times daily.   No current facility-administered medications on file prior to visit.    Review of Systems Per HPI unless specifically indicated above    Objective:    BP 136/84 mmHg  Pulse 76  Temp(Src) 98.4 F (36.9 C) (Oral)  Wt 211 lb (95.709 kg)    Physical Exam  Constitutional: He appears well-developed and well-nourished. No distress.  HENT:  Head: Normocephalic and atraumatic.  Right Ear: External ear normal.  Left Ear: External ear normal.  Nose: Nose normal.  Mouth/Throat: Oropharynx is clear and moist. No oropharyngeal exudate.  Eyes: Conjunctivae and EOM are normal. Pupils are equal, round, and reactive to light. No scleral icterus.  Neck: Normal range of motion. Neck supple.  Cardiovascular: Normal rate, regular rhythm, normal heart sounds and intact distal pulses.   No murmur heard. Pulmonary/Chest: Effort normal and breath sounds normal. No respiratory distress. He has no wheezes. He has no rales.  Musculoskeletal: He exhibits no edema.  Diabetic foot exam: Normal inspection No skin breakdown Early calluses  Normal DP/PT pulses Normal sensation to light touch and decreased to monofilament Nails normal  Lymphadenopathy:    He has no cervical adenopathy.  Skin: Skin is warm and dry. No rash noted.  Psychiatric: He has a normal mood and affect.  Nursing note and vitals reviewed.  Results for orders placed or performed in visit on 02/16/14  HM DIABETES EYE EXAM  Result Value Ref Range   HM Diabetic Eye Exam No Retinopathy No Retinopathy      Assessment & Plan:   Problem List Items Addressed This Visit    Type 2 diabetes, uncontrolled, with neuropathy - Primary    For neuropathy - trial of gabapentin. Start at $Remove'100mg'BcpNklz$  nightly, may increase to  $'300mg'S$  nightly and add during day if needed. Discussed common side effects Discussed hypoglycemic episodes and importance of consistent meals and regular schedule for optimal diabetic control.  Discussed need to take amaryl with meals, encouraged regular breakfast intake. Update if this doesn't resolve sxs. Check A1c, microalb and Cr today.    Relevant Orders      Hemoglobin A1c      Microalbumin / creatinine urine ratio      Basic metabolic panel       Follow up  plan: Return in about 4 months (around 09/30/2014), or as needed, for follow up visit.

## 2014-06-01 NOTE — Addendum Note (Signed)
Addended by: Royann Shivers A on: 06/01/2014 07:48 AM   Modules accepted: Orders

## 2014-06-01 NOTE — Patient Instructions (Addendum)
Flu and pneumonia (pneumovax) shot today. Try to be consistent with meals and mealtimes. Take amaryl (glimepiride) only with meals as this one tends to drop sugars. Try gabapentin - slowly taper up Return as needed or in 4 months for follow up.

## 2014-06-04 ENCOUNTER — Other Ambulatory Visit: Payer: Self-pay | Admitting: Family Medicine

## 2014-06-04 MED ORDER — SITAGLIPTIN PHOS-METFORMIN HCL 50-1000 MG PO TABS: 1.0000 | ORAL_TABLET | Freq: Two times a day (BID) | ORAL | Status: DC

## 2014-06-04 MED ORDER — GLIMEPIRIDE 1 MG PO TABS: 1.0000 mg | ORAL_TABLET | Freq: Every day | ORAL | Status: DC

## 2014-10-20 ENCOUNTER — Encounter: Payer: Self-pay | Admitting: *Deleted

## 2014-10-20 ENCOUNTER — Encounter: Payer: Self-pay | Admitting: Family Medicine

## 2014-10-20 ENCOUNTER — Ambulatory Visit (INDEPENDENT_AMBULATORY_CARE_PROVIDER_SITE_OTHER): Payer: BLUE CROSS/BLUE SHIELD | Admitting: Family Medicine

## 2014-10-20 VITALS — BP 120/80 | HR 82 | Temp 98.0°F | Wt 199.8 lb

## 2014-10-20 DIAGNOSIS — S83206A Unspecified tear of unspecified meniscus, current injury, right knee, initial encounter: Secondary | ICD-10-CM | POA: Diagnosis not present

## 2014-10-20 DIAGNOSIS — M25561 Pain in right knee: Secondary | ICD-10-CM | POA: Insufficient documentation

## 2014-10-20 MED ORDER — TRAMADOL HCL 50 MG PO TABS: 50.0000 mg | ORAL_TABLET | Freq: Three times a day (TID) | ORAL | Status: DC | PRN

## 2014-10-20 NOTE — Assessment & Plan Note (Signed)
Concern for R medial meniscal tear as well as MCL injury. Will refer to ortho. In interim, treat with tramadol for pain, exercises from [facility] pt advisor, and playmaker knee brace. 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 pt/wife agree with plan.

## 2014-10-20 NOTE — Progress Notes (Signed)
Pre visit review using our clinic review tool, if applicable. No additional management support is needed unless otherwise documented below in the visit note. 

## 2014-10-20 NOTE — Progress Notes (Signed)
BP 120/80 mmHg  Pulse 82  Temp(Src) 98 F (36.7 C) (Oral)  Wt 199 lb 12.8 oz (90.629 kg)   CC: R knee pain  Subjective:    Patient ID: Blake Manning, male    DOB: Aug 18, 1955, 59 y.o.   MRN: [id number]  HPI: Blake Manning is a 59 y.o. male presenting on 10/20/2014 for Knee Pain   Several month h/o R knee pain. Acutely worse over last 2 weeks and again Wednesday. Exacerbated after he was playing neighborhood baseball. Last night twisted knee and had severe pain. No swelling or redness or warmth of knee. Worse with prolonged standing. Worse pain with twisting motion.   So far has tried ibuprofen and elevation of knee and heating pad and salon pas.   + locking and popping and instability.  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  H/o L knee surgery (thinks torn meniscus).   Relevant past medical, surgical, family and social history reviewed and updated as indicated. Interim medical history since our last visit reviewed. Allergies and medications reviewed and updated. Current Outpatient Prescriptions on File Prior to Visit  Medication Sig  . Blood Glucose Monitoring Suppl (BLOOD GLUCOSE METER) kit Accu-Chek Comfort Curve Dx: 250.00  Use to check sugar once daily  . gabapentin (NEURONTIN) 100 MG capsule Take 1-3 capsules (100-300 mg total) by mouth at bedtime.  Marland Kitchen glimepiride (AMARYL) 1 MG tablet Take 1 tablet (1 mg total) by mouth daily with breakfast.  . glucose blood (ACCU-CHEK COMFORT CURVE) test strip Use to check blood sugar once daily. Dx: 250.00  . Red Yeast Rice 600 MG CAPS Take 1 capsule by mouth 2 (two) times daily.  . sitaGLIPtin-metformin (JANUMET) 50-1000 MG per tablet Take 1 tablet by mouth 2 (two) times daily with a meal.  . allopurinol (ZYLOPRIM) 300 MG tablet Take 300 mg by mouth daily.   No current facility-administered medications on file prior to visit.    Review of Systems Per HPI unless specifically indicated above     Objective:     BP 120/80 mmHg  Pulse 82  Temp(Src) 98 F (36.7 C) (Oral)  Wt 199 lb 12.8 oz (90.629 kg)  Wt Readings from Last 3 Encounters:  10/20/14 199 lb 12.8 oz (90.629 kg)  06/01/14 211 lb (95.709 kg)  01/24/14 205 lb 12 oz (93.328 kg)    Physical Exam  Constitutional: He appears well-developed and well-nourished. No distress.  Musculoskeletal: He exhibits no edema.  L knee WNL R knee exam: No deformity on inspection. Painful to palpation of medial and popliteal regions Mild swelling noted at knee.  Limited ROM in flexion/extension 2/2 pain with locking at full extension Neg drawer test. ++ mcmurray test. Pain with valgus stress. No PFgrind. No abnormal patellar mobility.  Skin: Skin is warm and dry. No rash noted.  L anterior knee abrasion  Nursing note and vitals reviewed.  Results for orders placed or performed in visit on 06/01/14  Hemoglobin A1c  Result Value Ref Range   Hgb A1c MFr Bld 7.8 (H) 4.6 - 6.5 %  Microalbumin / creatinine urine ratio  Result Value Ref Range   Microalb, Ur 3.7 (H) 0.0 - 1.9 mg/dL   Creatinine,U 755.9 mg/dL   Microalb Creat Ratio 2.0 0.0 - 30.0 mg/g  Basic metabolic panel  Result Value Ref Range   Sodium 136 135 - 145 mEq/L   Potassium 4.1 3.5 - 5.1 mEq/L   Chloride 101 96 - 112 mEq/L   CO2  26 19 - 32 mEq/L   Glucose, Bld 158 (H) 70 - 99 mg/dL   BUN 11 6 - 23 mg/dL   Creatinine, Ser 0.9 0.4 - 1.5 mg/dL   Calcium 9.2 8.4 - 10.5 mg/dL   GFR 88.19 >60.00 mL/min      Assessment & Plan:   Problem List Items Addressed This Visit    Right knee meniscal tear - Primary    Concern for R medial meniscal tear as well as MCL injury. Will refer to ortho. In interim, treat with tramadol for pain, exercises from [facility] pt advisor, and playmaker knee brace. 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 pt/wife agree with plan.      Relevant Orders   Ambulatory referral to Orthopedic Surgery       Follow up plan: Return  in about 4 weeks (around 11/17/2014) for follow up visit.

## 2014-10-20 NOTE — Patient Instructions (Addendum)
I'm suspicious for meniscal and medial collateral ligament injury. Tramadol for pain. Use knee brace for extra support. Exercises provided today for meniscal injury. I will also refer you to orthopedist.  Schedule follow up visit for diabetes in next 1-2 months.

## 2014-10-21 ENCOUNTER — Encounter: Payer: Self-pay | Admitting: Family Medicine

## 2014-10-21 ENCOUNTER — Ambulatory Visit: Payer: Self-pay | Admitting: Family Medicine

## 2014-10-21 NOTE — Telephone Encounter (Signed)
Please see Mychart message from patient.  

## 2014-10-24 ENCOUNTER — Telehealth: Payer: Self-pay | Admitting: Family Medicine

## 2014-10-24 NOTE — Telephone Encounter (Signed)
See above message. Work note in chart and routed through [employer], plz ensure pt received.

## 2014-10-24 NOTE — Telephone Encounter (Signed)
[facility] Dr. Mack Guise on Friday 10/28/14 @ 830am   Pt aware of appt and notes faxed

## 2014-10-24 NOTE — Telephone Encounter (Signed)
See my chart message

## 2014-10-24 NOTE — Telephone Encounter (Signed)
Pt saw dr g on the 14th for knee pain He needs a letter for work with work restriction  (going up and down stairs and lifting things) Let pt know when ready

## 2014-10-24 NOTE — Telephone Encounter (Signed)
Pt called checking on his ortho referral He would like to go to [place]

## 2014-10-25 NOTE — Telephone Encounter (Signed)
Letter sent to patient through Mychart.

## 2014-11-07 ENCOUNTER — Other Ambulatory Visit: Payer: Self-pay | Admitting: Orthopedic Surgery

## 2014-11-07 DIAGNOSIS — M238X1 Other internal derangements of right knee: Secondary | ICD-10-CM

## 2014-11-15 ENCOUNTER — Ambulatory Visit
Admission: RE | Admit: 2014-11-15 | Discharge: 2014-11-15 | Disposition: A | Discharge status: Home / Self Care | Payer: BLUE CROSS/BLUE SHIELD | Source: Ambulatory Visit | Attending: Orthopedic Surgery | Admitting: Orthopedic Surgery

## 2014-11-15 DIAGNOSIS — M25461 Effusion, right knee: Secondary | ICD-10-CM | POA: Diagnosis not present

## 2014-11-15 DIAGNOSIS — M25561 Pain in right knee: Secondary | ICD-10-CM | POA: Diagnosis not present

## 2014-11-15 DIAGNOSIS — M238X1 Other internal derangements of right knee: Secondary | ICD-10-CM

## 2014-11-15 DIAGNOSIS — M7121 Synovial cyst of popliteal space [Baker], right knee: Secondary | ICD-10-CM | POA: Diagnosis not present

## 2014-12-09 ENCOUNTER — Encounter: Payer: Self-pay | Admitting: Family Medicine

## 2015-06-03 ENCOUNTER — Other Ambulatory Visit: Payer: Self-pay | Admitting: Family Medicine

## 2015-06-03 DIAGNOSIS — IMO0002 Reserved for concepts with insufficient information to code with codable children: Secondary | ICD-10-CM

## 2015-06-03 DIAGNOSIS — E785 Hyperlipidemia, unspecified: Secondary | ICD-10-CM

## 2015-06-03 DIAGNOSIS — K760 Fatty (change of) liver, not elsewhere classified: Secondary | ICD-10-CM

## 2015-06-03 DIAGNOSIS — Z125 Encounter for screening for malignant neoplasm of prostate: Secondary | ICD-10-CM

## 2015-06-03 DIAGNOSIS — Z85528 Personal history of other malignant neoplasm of kidney: Secondary | ICD-10-CM

## 2015-06-03 DIAGNOSIS — E1165 Type 2 diabetes mellitus with hyperglycemia: Principal | ICD-10-CM

## 2015-06-03 DIAGNOSIS — D649 Anemia, unspecified: Secondary | ICD-10-CM

## 2015-06-03 DIAGNOSIS — E1140 Type 2 diabetes mellitus with diabetic neuropathy, unspecified: Secondary | ICD-10-CM

## 2015-06-03 DIAGNOSIS — Z87442 Personal history of urinary calculi: Secondary | ICD-10-CM

## 2015-06-06 ENCOUNTER — Other Ambulatory Visit (INDEPENDENT_AMBULATORY_CARE_PROVIDER_SITE_OTHER): Payer: BLUE CROSS/BLUE SHIELD

## 2015-06-06 DIAGNOSIS — E785 Hyperlipidemia, unspecified: Secondary | ICD-10-CM

## 2015-06-06 DIAGNOSIS — E1140 Type 2 diabetes mellitus with diabetic neuropathy, unspecified: Secondary | ICD-10-CM

## 2015-06-06 DIAGNOSIS — Z125 Encounter for screening for malignant neoplasm of prostate: Secondary | ICD-10-CM | POA: Diagnosis not present

## 2015-06-06 DIAGNOSIS — D649 Anemia, unspecified: Secondary | ICD-10-CM | POA: Diagnosis not present

## 2015-06-06 DIAGNOSIS — IMO0002 Reserved for concepts with insufficient information to code with codable children: Secondary | ICD-10-CM

## 2015-06-06 DIAGNOSIS — Z87442 Personal history of urinary calculi: Secondary | ICD-10-CM | POA: Diagnosis not present

## 2015-06-06 DIAGNOSIS — E1165 Type 2 diabetes mellitus with hyperglycemia: Secondary | ICD-10-CM | POA: Diagnosis not present

## 2015-06-06 DIAGNOSIS — K760 Fatty (change of) liver, not elsewhere classified: Secondary | ICD-10-CM

## 2015-06-06 LAB — COMPREHENSIVE METABOLIC PANEL
ALT: 37 U/L (ref 0–53)
AST: 24 U/L (ref 0–37)
Albumin: 4.4 g/dL (ref 3.5–5.2)
Alkaline Phosphatase: 56 U/L (ref 39–117)
BUN: 12 mg/dL (ref 6–23)
CO2: 28 mEq/L (ref 19–32)
Calcium: 9.4 mg/dL (ref 8.4–10.5)
Chloride: 103 mEq/L (ref 96–112)
Creatinine, Ser: 0.92 mg/dL (ref 0.40–1.50)
GFR: 90.08 mL/min (ref >60.00)
Glucose, Bld: 186 mg/dL — ABNORMAL HIGH (ref 70–99)
Potassium: 4.3 mEq/L (ref 3.5–5.1)
Sodium: 138 mEq/L (ref 135–145)
Total Bilirubin: 0.6 mg/dL (ref 0.2–1.2)
Total Protein: 7 g/dL (ref 6.0–8.3)

## 2015-06-06 LAB — CBC WITH DIFFERENTIAL/PLATELET
Basophils Absolute: 0.1 10*3/uL (ref 0.0–0.1)
Basophils Relative: 1 % (ref 0.0–3.0)
Eosinophils Absolute: 0.1 10*3/uL (ref 0.0–0.7)
Eosinophils Relative: 1.4 % (ref 0.0–5.0)
HCT: 38.8 % — ABNORMAL LOW (ref 39.0–52.0)
Hemoglobin: 12.4 g/dL — ABNORMAL LOW (ref 13.0–17.0)
Lymphocytes Relative: 36.6 % (ref 12.0–46.0)
Lymphs Abs: 2.3 10*3/uL (ref 0.7–4.0)
MCHC: 32.1 g/dL (ref 30.0–36.0)
MCV: 70.8 fl — ABNORMAL LOW (ref 78.0–100.0)
Monocytes Absolute: 0.5 10*3/uL (ref 0.1–1.0)
Monocytes Relative: 8.1 % (ref 3.0–12.0)
Neutro Abs: 3.4 10*3/uL (ref 1.4–7.7)
Neutrophils Relative %: 52.9 % (ref 43.0–77.0)
Platelets: 217 10*3/uL (ref 150.0–400.0)
RBC: 5.48 Mil/uL (ref 4.22–5.81)
RDW: 14.8 % (ref 11.5–15.5)
WBC: 6.4 10*3/uL (ref 4.0–10.5)

## 2015-06-06 LAB — MICROALBUMIN / CREATININE URINE RATIO
Creatinine,U: 111.3 mg/dL
Microalb Creat Ratio: 1.4 mg/g (ref 0.0–30.0)
Microalb, Ur: 1.6 mg/dL (ref 0.0–1.9)

## 2015-06-06 LAB — URIC ACID: Uric Acid, Serum: 4.7 mg/dL (ref 4.0–7.8)

## 2015-06-06 LAB — LIPID PANEL
Cholesterol: 181 mg/dL (ref 0–200)
HDL: 46.9 mg/dL (ref >39.00)
LDL Cholesterol: 110 mg/dL — ABNORMAL HIGH (ref 0–99)
NonHDL: 134.59
Total CHOL/HDL Ratio: 4
Triglycerides: 123 mg/dL (ref 0.0–149.0)
VLDL: 24.6 mg/dL (ref 0.0–40.0)

## 2015-06-06 LAB — PSA: PSA: 0.4 ng/mL (ref 0.10–4.00)

## 2015-06-06 LAB — HEMOGLOBIN A1C: Hgb A1c MFr Bld: 7.7 % — ABNORMAL HIGH (ref 4.6–6.5)

## 2015-06-12 ENCOUNTER — Encounter: Payer: Self-pay | Admitting: Family Medicine

## 2015-06-12 ENCOUNTER — Ambulatory Visit (INDEPENDENT_AMBULATORY_CARE_PROVIDER_SITE_OTHER): Payer: BLUE CROSS/BLUE SHIELD | Admitting: Family Medicine

## 2015-06-12 VITALS — BP 130/80 | HR 76 | Temp 97.9°F | Ht 67.0 in | Wt 199.2 lb

## 2015-06-12 DIAGNOSIS — IMO0002 Reserved for concepts with insufficient information to code with codable children: Secondary | ICD-10-CM

## 2015-06-12 DIAGNOSIS — Z85528 Personal history of other malignant neoplasm of kidney: Secondary | ICD-10-CM

## 2015-06-12 DIAGNOSIS — E1140 Type 2 diabetes mellitus with diabetic neuropathy, unspecified: Secondary | ICD-10-CM

## 2015-06-12 DIAGNOSIS — Z0000 Encounter for general adult medical examination without abnormal findings: Secondary | ICD-10-CM

## 2015-06-12 DIAGNOSIS — E1165 Type 2 diabetes mellitus with hyperglycemia: Secondary | ICD-10-CM

## 2015-06-12 DIAGNOSIS — E669 Obesity, unspecified: Secondary | ICD-10-CM | POA: Diagnosis not present

## 2015-06-12 DIAGNOSIS — E663 Overweight: Secondary | ICD-10-CM | POA: Insufficient documentation

## 2015-06-12 DIAGNOSIS — K760 Fatty (change of) liver, not elsewhere classified: Secondary | ICD-10-CM

## 2015-06-12 DIAGNOSIS — E785 Hyperlipidemia, unspecified: Secondary | ICD-10-CM

## 2015-06-12 DIAGNOSIS — E66811 Obesity, class 1: Secondary | ICD-10-CM

## 2015-06-12 DIAGNOSIS — Z23 Encounter for immunization: Secondary | ICD-10-CM

## 2015-06-12 MED ORDER — GABAPENTIN 100 MG PO CAPS: 200.0000 mg | ORAL_CAPSULE | Freq: Every day | ORAL | Status: DC

## 2015-06-12 MED ORDER — RED YEAST RICE 600 MG PO CAPS: 1.0000 | ORAL_CAPSULE | Freq: Every day | ORAL | Status: DC

## 2015-06-12 NOTE — Patient Instructions (Addendum)
Flu shot today Schedule eye exam as you're due.  Start aspirin enteric coated 81mg  MWF. I've refilled gabapentin - take 2 at night. Restart red yeast rice for cholesterol (over the counter supplement). If any muscle pain, start co enzyme Q10 OTC supplement.  Sign release of information for colonoscopy from 2012 [place] - avoid added sugars and sweetened beverages.  Health Maintenance, Male A healthy lifestyle and preventative care can promote health and wellness.  Maintain regular health, dental, and eye exams.  Eat a healthy diet. Foods like vegetables, fruits, whole grains, low-fat dairy products, and lean protein foods contain the nutrients you need and are low in calories. Decrease your intake of foods high in solid fats, added sugars, and salt. Get information about a proper diet from your health care provider, if necessary.  Regular physical exercise is one of the most important things you can do for your health. Most adults should get at least 150 minutes of moderate-intensity exercise (any activity that increases your heart rate and causes you to sweat) each week. In addition, most adults need muscle-strengthening exercises on 2 or more days a week.   Maintain a healthy weight. The body mass index (BMI) is a screening tool to identify possible weight problems. It provides an estimate of body fat based on height and weight. Your health care provider can find your BMI and can help you achieve or maintain a healthy weight. For males 20 years and older:  A BMI below 18.5 is considered underweight.  A BMI of 18.5 to 24.9 is normal.  A BMI of 25 to 29.9 is considered overweight.  A BMI of 30 and above is considered obese.  Maintain normal blood lipids and cholesterol by exercising and minimizing your intake of saturated fat. Eat a balanced diet with plenty of fruits and vegetables. Blood tests for lipids and cholesterol should begin at age 31 and be repeated every 5 years.  If your lipid or cholesterol levels are high, you are over age 70, or you are at high risk for heart disease, you may need your cholesterol levels checked more frequently.Ongoing high lipid and cholesterol levels should be treated with medicines if diet and exercise are not working.  If you smoke, find out from your health care provider how to quit. If you do not use tobacco, do not start.  Lung cancer screening is recommended for adults aged 67-80 years who are at high risk for developing lung cancer because of a history of smoking. A yearly low-dose CT scan of the lungs is recommended for people who have at least a 30-pack-year history of smoking and are current smokers or have quit within the past 15 years. A pack year of smoking is smoking an average of 1 pack of cigarettes a day for 1 year (for example, a 30-pack-year history of smoking could mean smoking 1 pack a day for 30 years or 2 packs a day for 15 years). Yearly screening should continue until the smoker has stopped smoking for at least 15 years. Yearly screening should be stopped for people who develop a health problem that would prevent them from having lung cancer treatment.  If you choose to drink alcohol, do not have more than 2 drinks per day. One drink is considered to be 12 oz (360 mL) of beer, 5 oz (150 mL) of wine, or 1.5 oz (45 mL) of liquor.  Avoid the use of street drugs. Do not share needles with anyone. Ask for help  if you need support or instructions about stopping the use of drugs.  High blood pressure causes heart disease and increases the risk of stroke. High blood pressure is more likely to develop in:  People who have blood pressure in the end of the normal range (100-139/85-89 mm Hg).  People who are overweight or obese.  People who are African American.  If you are 40-7 years of age, have your blood pressure checked every 3-5 years. If you are 13 years of age or older, have your blood pressure checked every  year. You should have your blood pressure measured twice--once when you are at a hospital or clinic, and once when you are not at a hospital or clinic. Record the average of the two measurements. To check your blood pressure when you are not at a hospital or clinic, you can use:  An automated blood pressure machine at a pharmacy.  A home blood pressure monitor.  If you are 62-40 years old, ask your health care provider if you should take aspirin to prevent heart disease.  Diabetes screening involves taking a blood sample to check your fasting blood sugar level. This should be done once every 3 years after age 85 if you are at a normal weight and without risk factors for diabetes. Testing should be considered at a younger age or be carried out more frequently if you are overweight and have at least 1 risk factor for diabetes.  Colorectal cancer can be detected and often prevented. Most routine colorectal cancer screening begins at the age of 65 and continues through age 72. However, your health care provider may recommend screening at an earlier age if you have risk factors for colon cancer. On a yearly basis, your health care provider may provide home test kits to check for hidden blood in the stool. A small camera at the end of a tube may be used to directly examine the colon (sigmoidoscopy or colonoscopy) to detect the earliest forms of colorectal cancer. Talk to your health care provider about this at age 35 when routine screening begins. A direct exam of the colon should be repeated every 5-10 years through age 80, unless early forms of precancerous polyps or small growths are found.  People who are at an increased risk for hepatitis B should be screened for this virus. You are considered at high risk for hepatitis B if:  You were born in a country where hepatitis B occurs often. Talk with your health care provider about which countries are considered high risk.  Your parents were born in a  high-risk country and you have not received a shot to protect against hepatitis B (hepatitis B vaccine).  You have HIV or AIDS.  You use needles to inject street drugs.  You live with, or have sex with, someone who has hepatitis B.  You are a man who has sex with other men (MSM).  You get hemodialysis treatment.  You take certain medicines for conditions like cancer, organ transplantation, and autoimmune conditions.  Hepatitis C blood testing is recommended for all people born from [birth year] through 1963 and any individual with known risk factors for hepatitis C.  Healthy men should no longer receive prostate-specific antigen (PSA) blood tests as part of routine cancer screening. Talk to your health care provider about prostate cancer screening.  Testicular cancer screening is not recommended for adolescents or adult males who have no symptoms. Screening includes self-exam, a health care provider exam, and other screening tests.  Consult with your health care provider about any symptoms you have or any concerns you have about testicular cancer.  Practice safe sex. Use condoms and avoid high-risk sexual practices to reduce the spread of sexually transmitted infections (STIs).  You should be screened for STIs, including gonorrhea and chlamydia if:  You are sexually active and are younger than 24 years.  You are older than 24 years, and your health care provider tells you that you are at risk for this type of infection.  Your sexual activity has changed since you were last screened, and you are at an increased risk for chlamydia or gonorrhea. Ask your health care provider if you are at risk.  If you are at risk of being infected with HIV, it is recommended that you take a prescription medicine daily to prevent HIV infection. This is called pre-exposure prophylaxis (PrEP). You are considered at risk if:  You are a man who has sex with other men (MSM).  You are a heterosexual man who is  sexually active with multiple partners.  You take drugs by injection.  You are sexually active with a partner who has HIV.  Talk with your health care provider about whether you are at high risk of being infected with HIV. If you choose to begin PrEP, you should first be tested for HIV. You should then be tested every 3 months for as long as you are taking PrEP.  Use sunscreen. Apply sunscreen liberally and repeatedly throughout the day. You should seek shade when your shadow is shorter than you. Protect yourself by wearing long sleeves, pants, a wide-brimmed hat, and sunglasses year round whenever you are outdoors.  Tell your health care provider of new moles or changes in moles, especially if there is a change in shape or color. Also, tell your health care provider if a mole is larger than the size of a pencil eraser.  A one-time screening for abdominal aortic aneurysm (AAA) and surgical repair of large AAAs by ultrasound is recommended for men aged 84-75 years who are current or former smokers.  Stay current with your vaccines (immunizations).   This information is not intended to replace advice given to you by your health care provider. Make sure you discuss any questions you have with your health care provider.   Document Released: 12/21/2007 Document Revised: 07/15/2014 Document Reviewed: 11/19/2010 Elsevier Interactive Patient Education [employer].

## 2015-06-12 NOTE — Assessment & Plan Note (Signed)
Discussed with patient, LFTs normal. rec better glycemic and cholesterol control.

## 2015-06-12 NOTE — Assessment & Plan Note (Signed)
Chronic, uncontrolled. Refilled gabapentin 200mg  nightly per pt working well. Discussed importance of better glycemic control. Continue current regimen, try to adhere more closely to diabetic diet. amaryl caused low sugars so he self stopped.

## 2015-06-12 NOTE — Assessment & Plan Note (Signed)
Followed yearly by Dr Yves Dill urologist.

## 2015-06-12 NOTE — Assessment & Plan Note (Signed)
Discussed healthy diet and lifestyle changes to affect sustainable weight loss  

## 2015-06-12 NOTE — Assessment & Plan Note (Signed)
Preventative protocols reviewed and updated unless pt declined. Discussed healthy diet and lifestyle.  

## 2015-06-12 NOTE — Progress Notes (Signed)
BP 130/80 mmHg  Pulse 76  Temp(Src) 97.9 F (36.6 C) (Oral)  Ht $R'5\' 7"'Nl$  (1.702 m)  Wt 199 lb 4 oz (90.379 kg)  BMI 31.20 kg/m2   CC: CPE  Subjective:    Patient ID: Blake Manning, male    DOB: [birth year], 59 y.o.   MRN: [id number]  HPI: Blake Manning is a 59 y.o. male presenting on 06/12/2015 for Annual Exam   Never needed R knee surgery - baker's cyst ruptured on its own.  HLD - did not tolerate lipitor or pravastatin - body aches.   DM - fasting sugars several times a week 120-200. With janumet 50/500 bid and amaryl $RemoveBe'2mg'[id number]$  daily.hypoglycemia with amaryl. Ran out of gabapentin but requests refilled because it was helpful. + paresthesias. DM eye exam: DUE. 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 Saw 1 wk ago, good report. Sees yearly. Last CT was 3 yrs ago.   Preventative: Colonoscopy 2012@ [place] - records requested today Prostate cancer screening - followed by urologist who he sees for h/o kidney cancer. Flu shot - today Tetanus shot - 2011.  Pneumovax - today. 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 Interim medical history since our last visit reviewed. Allergies and medications reviewed and updated. Current Outpatient Prescriptions on File Prior to Visit  Medication Sig  . allopurinol (ZYLOPRIM) 300 MG tablet Take 300 mg by mouth daily.  . Blood Glucose Monitoring Suppl (BLOOD GLUCOSE METER) kit Accu-Chek Comfort Curve Dx: 250.00  Use to check sugar once daily  . glucose blood (ACCU-CHEK COMFORT CURVE) test strip Use to check blood sugar once daily. Dx: 250.00  . sitaGLIPtin-metformin (JANUMET) 50-1000 MG per tablet Take 1 tablet by mouth 2 (two) times daily with a  meal.  . traMADol (ULTRAM) 50 MG tablet Take 1 tablet (50 mg total) by mouth every 8 (eight) hours as needed for moderate pain (sedation precautions).   No current facility-administered medications on file prior to visit.    Review of Systems  Constitutional: Negative for fever, chills, activity change, appetite change, fatigue and unexpected weight change.  HENT: Negative for hearing loss.   Eyes: Negative for visual disturbance.  Respiratory: Negative for cough, chest tightness, shortness of breath and wheezing.   Cardiovascular: Negative for chest pain, palpitations and leg swelling.  Gastrointestinal: Negative for nausea, vomiting, abdominal pain, diarrhea, constipation, blood in stool and abdominal distention.  Genitourinary: Negative for hematuria and difficulty urinating.  Musculoskeletal: Negative for myalgias, arthralgias and neck pain.  Skin: Negative for rash.  Neurological: Negative for dizziness, seizures, syncope and headaches.  Hematological: Negative for adenopathy. Does not bruise/bleed easily.  Psychiatric/Behavioral: Negative for dysphoric mood. The patient is not nervous/anxious.    Per HPI unless specifically indicated in ROS section     Objective:    BP 130/80 mmHg  Pulse 76  Temp(Src) 97.9 F (36.6 C) (Oral)  Ht $R'5\' 7"'LI$  (1.702 m)  Wt 199 lb 4 oz (90.379 kg)  BMI 31.20 kg/m2  Wt Readings from Last 3 Encounters:  06/12/15 199 lb 4 oz (90.379 kg)  11/15/14 198 lb (89.812 kg)  10/20/14 199 lb 12.8 oz (90.629 kg)    Physical Exam  Constitutional: He is oriented to person,  place, and time. He appears well-developed and well-nourished. No distress.  HENT:  Head: Normocephalic and atraumatic.  Right Ear: Hearing, tympanic membrane, external ear and ear canal normal.  Left Ear: Hearing, tympanic membrane, external ear and ear canal normal.  Nose: Nose normal.  Mouth/Throat: Uvula is midline, oropharynx is clear and moist and mucous membranes are normal. No  oropharyngeal exudate, posterior oropharyngeal edema or posterior oropharyngeal erythema.  Eyes: Conjunctivae and EOM are normal. Pupils are equal, round, and reactive to light. No scleral icterus.  Neck: Normal range of motion. Neck supple. No thyromegaly present.  Cardiovascular: Normal rate, regular rhythm, normal heart sounds and intact distal pulses.   No murmur heard. Pulses:      Radial pulses are 2+ on the right side, and 2+ on the left side.  Pulmonary/Chest: Effort normal and breath sounds normal. No respiratory distress. He has no wheezes. He has no rales.  Abdominal: Soft. Bowel sounds are normal. He exhibits no distension and no mass. There is no tenderness. There is no rebound and no guarding.  Musculoskeletal: Normal range of motion. He exhibits no edema.  Lymphadenopathy:    He has no cervical adenopathy.  Neurological: He is alert and oriented to person, place, and time.  CN grossly intact, station and gait intact  Skin: Skin is warm and dry. No rash noted.  Psychiatric: He has a normal mood and affect. His behavior is normal. Judgment and thought content normal.  Nursing note and vitals reviewed.  Results for orders placed or performed in visit on 06/12/15  HM COLONOSCOPY  Result Value Ref Range   HM Colonoscopy per patient done        Assessment & Plan:  Hep C screen next lab visit. Problem List Items Addressed This Visit    Type 2 diabetes, uncontrolled, with neuropathy (HCC)    Chronic, uncontrolled. Refilled gabapentin 200mg  nightly per pt working well. Discussed importance of better glycemic control. Continue current regimen, try to adhere more closely to diabetic diet. amaryl caused low sugars so he self stopped.      Relevant Medications   aspirin (ASPIRIN EC) 81 MG EC tablet   Obesity, Class I, BMI 30-34.9    Discussed healthy diet and lifestyle changes to affect sustainable weight loss.      Hyperlipidemia    Intolerant of lipitor or pravastatin.  Stopped RYR. rec restart once daily, discussed coQ10 use.      Relevant Medications   aspirin (ASPIRIN EC) 81 MG EC tablet   History of renal cell cancer    Followed yearly by Dr Yves Dill urologist.      Health maintenance examination - Primary    Preventative protocols reviewed and updated unless pt declined. Discussed healthy diet and lifestyle.       Fatty liver    Discussed with patient, LFTs normal. rec better glycemic and cholesterol control.       Other Visit Diagnoses    Need for influenza vaccination        Relevant Orders    Flu Vaccine QUAD 36+ mos PF IM (Fluarix & Fluzone Quad PF)        Follow up plan: Return in about 4 months (around 10/11/2015), or as needed, for follow up visit.

## 2015-06-12 NOTE — Progress Notes (Signed)
Pre visit review using our clinic review tool, if applicable. No additional management support is needed unless otherwise documented below in the visit note. 

## 2015-06-12 NOTE — Assessment & Plan Note (Signed)
Intolerant of lipitor or pravastatin. Stopped RYR. rec restart once daily, discussed coQ10 use.

## 2015-06-27 ENCOUNTER — Other Ambulatory Visit: Payer: Self-pay | Admitting: Family Medicine

## 2015-10-02 ENCOUNTER — Other Ambulatory Visit (INDEPENDENT_AMBULATORY_CARE_PROVIDER_SITE_OTHER): Payer: BLUE CROSS/BLUE SHIELD

## 2015-10-02 ENCOUNTER — Other Ambulatory Visit: Payer: Self-pay | Admitting: Family Medicine

## 2015-10-02 DIAGNOSIS — IMO0002 Reserved for concepts with insufficient information to code with codable children: Secondary | ICD-10-CM

## 2015-10-02 DIAGNOSIS — E1140 Type 2 diabetes mellitus with diabetic neuropathy, unspecified: Secondary | ICD-10-CM

## 2015-10-02 DIAGNOSIS — E1165 Type 2 diabetes mellitus with hyperglycemia: Principal | ICD-10-CM

## 2015-10-02 DIAGNOSIS — Z1159 Encounter for screening for other viral diseases: Secondary | ICD-10-CM

## 2015-10-02 LAB — BASIC METABOLIC PANEL
BUN: 16 mg/dL (ref 6–23)
CO2: 27 mEq/L (ref 19–32)
Calcium: 9.7 mg/dL (ref 8.4–10.5)
Chloride: 103 mEq/L (ref 96–112)
Creatinine, Ser: 0.98 mg/dL (ref 0.40–1.50)
GFR: 83.65 mL/min (ref >60.00)
Glucose, Bld: 163 mg/dL — ABNORMAL HIGH (ref 70–99)
Potassium: 4.4 mEq/L (ref 3.5–5.1)
Sodium: 139 mEq/L (ref 135–145)

## 2015-10-02 LAB — HEMOGLOBIN A1C: Hgb A1c MFr Bld: 8.2 % — ABNORMAL HIGH (ref 4.6–6.5)

## 2015-10-02 LAB — HEPATITIS C ANTIBODY: HCV Ab: NEGATIVE

## 2015-10-09 ENCOUNTER — Ambulatory Visit: Payer: BLUE CROSS/BLUE SHIELD | Admitting: Family Medicine

## 2015-10-16 ENCOUNTER — Encounter: Payer: Self-pay | Admitting: Family Medicine

## 2015-10-16 ENCOUNTER — Ambulatory Visit (INDEPENDENT_AMBULATORY_CARE_PROVIDER_SITE_OTHER): Payer: BLUE CROSS/BLUE SHIELD | Admitting: Family Medicine

## 2015-10-16 VITALS — BP 122/80 | HR 60 | Temp 97.8°F | Ht 67.0 in | Wt 198.5 lb

## 2015-10-16 DIAGNOSIS — E669 Obesity, unspecified: Secondary | ICD-10-CM

## 2015-10-16 DIAGNOSIS — E785 Hyperlipidemia, unspecified: Secondary | ICD-10-CM

## 2015-10-16 DIAGNOSIS — IMO0002 Reserved for concepts with insufficient information to code with codable children: Secondary | ICD-10-CM

## 2015-10-16 DIAGNOSIS — E1165 Type 2 diabetes mellitus with hyperglycemia: Secondary | ICD-10-CM

## 2015-10-16 DIAGNOSIS — E1140 Type 2 diabetes mellitus with diabetic neuropathy, unspecified: Secondary | ICD-10-CM

## 2015-10-16 DIAGNOSIS — Z85528 Personal history of other malignant neoplasm of kidney: Secondary | ICD-10-CM | POA: Diagnosis not present

## 2015-10-16 MED ORDER — GLIPIZIDE 5 MG PO TABS: 5.0000 mg | ORAL_TABLET | Freq: Every day | ORAL | Status: DC

## 2015-10-16 NOTE — Progress Notes (Signed)
Pre visit review using our clinic review tool, if applicable. No additional management support is needed unless otherwise documented below in the visit note. 

## 2015-10-16 NOTE — Assessment & Plan Note (Signed)
Body mass index is 31.08 kg/(m^2).

## 2015-10-16 NOTE — Patient Instructions (Addendum)
Pass by Blake Manning's office to schedule diabetes education at [facility].  Try glipizide 5mg  with breakfast.  Continue to watch sugars in diet, check glucose once daily (alternate fasting with 2 hours after dinner).  Return in 4 months for follow up labs and office visit.

## 2015-10-16 NOTE — Progress Notes (Signed)
BP 122/80 mmHg  Pulse 60  Temp(Src) 97.8 F (36.6 C) (Oral)  Ht '5\' 7"'$  (1.702 m)  Wt 198 lb 8 oz (90.039 kg)  BMI 31.08 kg/m2   CC: f/u visit  Subjective:    Patient ID: Blake Manning, male    DOB: [birth year], 60 y.o.   MRN: [id number]  HPI: Blake Manning is a 60 y.o. male presenting on 10/16/2015 for Follow-up   DM - fasting sugars several times a week 130s. Post meal 150-180. compliant with janumet 50/500 bid. Ran out of gabapentin but requests refilled because it was helpful. + paresthesias controlled with gabapentin. No low sugars. Hypoglycemia with amaryl. DM eye exam: DUE, scheduled. Pneumovax: 05/2014. 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  Lab Results  Component Value Date   HGBA1C 8.2* 10/02/2015    Diabetic Foot Exam - Simple   Simple Foot Form  Diabetic Foot exam was performed with the following findings:  Yes 10/16/2015  8:28 AM  Visual Inspection  No deformities, no ulcerations, no other skin breakdown bilaterally:  Yes  Sensation Testing  See comments:  Yes  Pulse Check  Posterior Tibialis and Dorsalis pulse intact bilaterally:  Yes  Comments  Diminished sensation to light touch and monofilament R>L foot       HLD - tolerating RYR better than other statins. Lab Results  Component Value Date   LDLCALC 110* 06/06/2015    Relevant past medical, surgical, family and social history reviewed and updated as indicated. Interim medical history since our last visit reviewed. Allergies and medications reviewed and updated. Current Outpatient Prescriptions on File Prior to Visit  Medication Sig  . allopurinol (ZYLOPRIM) 300 MG tablet Take 300 mg by mouth daily.  Marland Kitchen aspirin (ASPIRIN EC) 81 MG EC tablet Take 81 mg by mouth every Monday, Wednesday, and Friday. Swallow whole.  . Blood Glucose Monitoring Suppl (BLOOD GLUCOSE METER) kit Accu-Chek Comfort Curve Dx: 250.00  Use to check sugar once daily  . gabapentin (NEURONTIN) 100 MG capsule Take 2 capsules (200  mg total) by mouth at bedtime.  Marland Kitchen glucose blood (ACCU-CHEK COMFORT CURVE) test strip Use to check blood sugar once daily. Dx: 250.00  . JANUMET 50-1000 MG tablet take 1 tablet by mouth twice a day WITH A MEAL  . Red Yeast Rice 600 MG CAPS Take 1 capsule (600 mg total) by mouth daily.  . traMADol (ULTRAM) 50 MG tablet Take 1 tablet (50 mg total) by mouth every 8 (eight) hours as needed for moderate pain (sedation precautions).   No current facility-administered medications on file prior to visit.    Review of Systems Per HPI unless specifically indicated in ROS section     Objective:    BP 122/80 mmHg  Pulse 60  Temp(Src) 97.8 F (36.6 C) (Oral)  Ht '5\' 7"'$  (1.702 m)  Wt 198 lb 8 oz (90.039 kg)  BMI 31.08 kg/m2  Wt Readings from Last 3 Encounters:  10/16/15 198 lb 8 oz (90.039 kg)  06/12/15 199 lb 4 oz (90.379 kg)  11/15/14 198 lb (89.812 kg)    Physical Exam  Constitutional: He appears well-developed and well-nourished. No distress.  HENT:  Head: Normocephalic and atraumatic.  Right Ear: External ear normal.  Left Ear: External ear normal.  Nose: Nose normal.  Mouth/Throat: Oropharynx is clear and moist. No oropharyngeal exudate.  Eyes: Conjunctivae and EOM are normal. Pupils are equal, round, and reactive to light. No scleral icterus.  Neck: Normal range of motion. Neck supple.  Cardiovascular: Normal rate, regular rhythm, normal heart sounds and intact distal pulses.   No murmur heard. Pulmonary/Chest: Effort normal and breath sounds normal. No respiratory distress. He has no wheezes. He has no rales.  Musculoskeletal: He exhibits no edema.  See HPI for foot exam if done  Lymphadenopathy:    He has no cervical adenopathy.  Skin: Skin is warm and dry. No rash noted.  Psychiatric: He has a normal mood and affect.  Nursing note and vitals reviewed.  Results for orders placed or performed in visit on 10/02/15  Hepatitis C antibody  Result Value Ref Range   HCV Ab  NEGATIVE NEGATIVE      Assessment & Plan:   Problem List Items Addressed This Visit    History of renal cell cancer    Sees uro yearly      Type 2 diabetes, uncontrolled, with neuropathy ([place]) - Primary    Chronic, uncontrolled. Add on glipizide '5mg'$  once daily with breakfast. Continue janumet. Refer to DSME. RTC 4 mo f/u visit.      Relevant Medications   glipiZIDE (GLUCOTROL) 5 MG tablet   Other Relevant Orders   Ambulatory referral to diabetic education   Hyperlipidemia    Chronic. Tolerating RYR '600mg'$  BID. Check LDL next lab visit.      Obesity, Class I, BMI 30-34.9    Body mass index is 31.08 kg/(m^2).       Relevant Medications   glipiZIDE (GLUCOTROL) 5 MG tablet       Follow up plan: Return in about 4 months (around 02/15/2016), or as needed, for follow up visit.  Ria Bush, MD

## 2015-10-16 NOTE — Assessment & Plan Note (Addendum)
Chronic, uncontrolled. Add on glipizide 5mg  once daily with breakfast. Continue janumet. Refer to DSME. RTC 4 mo f/u visit.

## 2015-10-16 NOTE — Assessment & Plan Note (Signed)
Chronic. Tolerating RYR 600mg  BID. Check LDL next lab visit.

## 2015-10-16 NOTE — Assessment & Plan Note (Signed)
Sees uro yearly. 

## 2015-10-24 LAB — HM DIABETES EYE EXAM

## 2015-10-25 ENCOUNTER — Encounter: Payer: Self-pay | Admitting: Family Medicine

## 2015-11-09 ENCOUNTER — Other Ambulatory Visit: Payer: Self-pay | Admitting: Family Medicine

## 2015-11-14 ENCOUNTER — Encounter: Payer: BLUE CROSS/BLUE SHIELD | Attending: Family Medicine | Admitting: Dietician

## 2015-11-14 ENCOUNTER — Encounter: Payer: Self-pay | Admitting: Dietician

## 2015-11-14 VITALS — BP 128/74 | Ht 67.0 in | Wt 197.0 lb

## 2015-11-14 DIAGNOSIS — E119 Type 2 diabetes mellitus without complications: Secondary | ICD-10-CM | POA: Insufficient documentation

## 2015-11-14 NOTE — Patient Instructions (Signed)
  Check blood sugars 2 x day before breakfast and varying other times (ac and  2 hr pp) every day  Exercise:  Try walking 10-15 min. 3x/wk.   Avoid sugar sweetened drinks (soda, tea, coffee, sports drinks, juices)  Eat 3 meals day,   1 snack a day at bedtime  Space meals 4-6 hours apart  Get a sharps container  Make  dentist doctor appointment  Bring blood sugar records to the next appointment  Return for appointment/classes on:  12-11-15

## 2015-11-14 NOTE — Progress Notes (Signed)
Diabetes Self-Management Education  Visit Type: First/Initial  Appt. Start Time: 1530 Appt. End Time:1630  11/14/2015  Blake Manning [facility], identified by name and date of birth, is a 60 y.o. male with a diagnosis of Diabetes: Type 2.   ASSESSMENT  Blood pressure 128/74, height 5\' 7"  (1.702 m), weight 197 lb (89.359 kg). Body mass index is 30.85 kg/(m^2).  Lacks knowledge of diabetes care Pt reports having frequent low BG's with Glipizide and  stopped Glipizide 1 wk. ago-no low BG's since then      Diabetes Self-Management Education - 11/14/15 1635    Visit Information   Visit Type First/Initial   Initial Visit   Diabetes Type Type 2   Health Coping   How would you rate your overall health? Good   Psychosocial Assessment   Patient Belief/Attitude about Diabetes Motivated to manage diabetes   Self-care barriers None   Patient Concerns Glycemic Control;Healthy Lifestyle;Medication  become more fit   Special Needs None   Preferred Learning Style Auditory;Visual;Hands on   Learning Readiness Ready   What is the last grade level you completed in school? 12   Pre-Education Assessment   Patient understands the diabetes disease and treatment process. Needs Review   Patient understands incorporating nutritional management into lifestyle. Needs Review   Patient undertands incorporating physical activity into lifestyle. Needs Instruction   Patient understands using medications safely. Needs Review   Patient understands monitoring blood glucose, interpreting and using results Needs Review   Patient understands prevention, detection, and treatment of acute complications. Needs Review   Patient understands prevention, detection, and treatment of chronic complications. Needs Review   Patient understands how to develop strategies to address psychosocial issues. Needs Instruction   Patient understands how to develop strategies to promote health/change behavior. Needs Review   Complications   Last HgB A1C per patient/outside source 8.1 %  10-2015   How often do you check your blood sugar? --  1x/day at varying times- recent results 120-140's since stopped Glipizide   Have you had a dilated eye exam in the past 12 months? Yes   Have you had a dental exam in the past 12 months? No  2014   Are you checking your feet? Yes   How many days per week are you checking your feet? 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   Medications Reviewed patients medication for diabetes, action, purpose, timing of dose and side effects.   Monitoring Purpose and frequency of SMBG.;Identified appropriate SMBG and/or A1C goals.;Yearly dilated eye exam;Taught/discussed recording of test results and interpretation of SMBG.   Chronic complications Relationship  between chronic complications and blood glucose control;Retinopathy and reason for yearly dilated eye exams;Dental care   Personal strategies to promote health Lifestyle issues that need to be addressed for better diabetes care      Individualized Plan for Diabetes  Self-Management Training:   Learning Objective:  Patient will have a greater understanding of diabetes self-management. Patient education plan is to attend individual and/or group sessions per assessed needs and concerns.   Plan:   Patient Instructions   Check blood sugars 2 x day before breakfast and varying other times (ac or  2 hr pp) every day  Exercise:  Try walking 10-15 min. 3x/wk.   Avoid sugar sweetened drinks (soda, tea, coffee, sports drinks, juices)  Eat 3 meals day,   1 snack a day at bedtime  Space meals 4-6 hours apart  Get a sharps container  Make  dentist doctor appointment  Bring blood sugar records to the next appointment  Return for appointment/classes on:  12-11-15   Expected Outcomes:   positive  Education material provided: General Meal Planning Guidelines  If problems or questions, patient to contact team via: [phone]  Future DSME appointment:  12-11-15

## 2015-12-11 ENCOUNTER — Ambulatory Visit: Payer: BLUE CROSS/BLUE SHIELD

## 2015-12-12 ENCOUNTER — Telehealth: Payer: Self-pay | Admitting: Dietician

## 2015-12-12 NOTE — Telephone Encounter (Signed)
Pt did not come to class 1 on 12-11-15-called pt-no answer-left message for pt to call us to reschedule classes

## 2015-12-18 ENCOUNTER — Ambulatory Visit: Payer: BLUE CROSS/BLUE SHIELD

## 2015-12-25 ENCOUNTER — Telehealth: Payer: Self-pay | Admitting: Dietician

## 2015-12-25 ENCOUNTER — Ambulatory Visit: Payer: BLUE CROSS/BLUE SHIELD

## 2015-12-25 NOTE — Telephone Encounter (Signed)
Have not heard from pt-called pt-no answer-left message on voice mail for pt to call us to reschedule diabetes classes

## 2016-01-05 ENCOUNTER — Ambulatory Visit (INDEPENDENT_AMBULATORY_CARE_PROVIDER_SITE_OTHER)
Admission: RE | Admit: 2016-01-05 | Discharge: 2016-01-05 | Disposition: A | Discharge status: Home / Self Care | Payer: BLUE CROSS/BLUE SHIELD | Source: Ambulatory Visit | Attending: Family Medicine | Admitting: Family Medicine

## 2016-01-05 ENCOUNTER — Ambulatory Visit (INDEPENDENT_AMBULATORY_CARE_PROVIDER_SITE_OTHER): Payer: BLUE CROSS/BLUE SHIELD | Admitting: Family Medicine

## 2016-01-05 ENCOUNTER — Encounter: Payer: Self-pay | Admitting: Family Medicine

## 2016-01-05 VITALS — BP 122/84 | HR 60 | Temp 97.8°F | Wt 196.0 lb

## 2016-01-05 DIAGNOSIS — M5090 Cervical disc disorder, unspecified, unspecified cervical region: Secondary | ICD-10-CM

## 2016-01-05 MED ORDER — CYCLOBENZAPRINE HCL 10 MG PO TABS: 5.0000 mg | ORAL_TABLET | Freq: Two times a day (BID) | ORAL | Status: DC | PRN

## 2016-01-05 NOTE — Progress Notes (Signed)
BP 122/84 mmHg  Pulse 60  Temp(Src) 97.8 F (36.6 C) (Oral)  Wt 196 lb (88.905 kg)  SpO2 98%   CC: right neck pain with headache Subjective:    Patient ID: Blake Manning, male    DOB: [birth year], 60 y.o.   MRN: [id number]  HPI: Blake Manning is a 60 y.o. male presenting on 01/05/2016 for Neck Pain; Headache; and Nausea   2 mo h/o mid cervical neck pain with radiation up occipital skull and down spine. Also noticing L 4th/5th digit numbness. Worse at night time. 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 No weakness noted. Treating with aleve.   Some nausea and dizziness from pain.   Known DDD (degenerative disc disease), lumbar Date: 01/2014 severe by CT scan at L4/5 and mod at L3/4 and L5/S1, severe R L4/5 and L L5/S1 neural foraminal narrowing.   H/o colonoscopy 2012 in [place] per patient, no records received yet.  DM - unable to tolerate glipizide due to hypoglycemia despite taking with meal. Took glucose tablets for this.  Relevant past medical, surgical, family and social history reviewed and updated as indicated. Interim medical history since our last visit reviewed. Allergies and medications reviewed and updated. Current Outpatient Prescriptions on File Prior to Visit  Medication Sig  . allopurinol (ZYLOPRIM) 300 MG tablet Take 300 mg by mouth daily.  Marland Kitchen aspirin (ASPIRIN EC) 81 MG EC tablet Take 81 mg by mouth every Monday, Wednesday, and Friday. Swallow whole.  . Blood Glucose Monitoring Suppl (BLOOD GLUCOSE METER) kit Accu-Chek Comfort Curve Dx: 250.00  Use to check sugar once daily  . gabapentin (NEURONTIN) 100 MG capsule Take 2 capsules (200 mg total) by mouth at bedtime.  Marland Kitchen glucose blood (ACCU-CHEK AVIVA PLUS) test strip Use to check sugar once daily. Dx: E11.40; E11.65  . JANUMET 50-1000 MG tablet take 1 tablet by mouth twice a day WITH A MEAL  . Red Yeast Rice 600 MG CAPS Take 1 capsule (600 mg total) by mouth  daily.   No current facility-administered medications on file prior to visit.    Review of Systems Per HPI unless specifically indicated in ROS section     Objective:    BP 122/84 mmHg  Pulse 60  Temp(Src) 97.8 F (36.6 C) (Oral)  Wt 196 lb (88.905 kg)  SpO2 98%  Wt Readings from Last 3 Encounters:  01/05/16 196 lb (88.905 kg)  11/14/15 197 lb (89.359 kg)  10/16/15 198 lb 8 oz (90.039 kg)    Physical Exam  Constitutional: He is oriented to person, place, and time. He appears well-developed and well-nourished. No distress.  Musculoskeletal: He exhibits no edema.  FROM at neck and shoulders No midline cervical spine or paracervical pain to palpation  Neurological: He is alert and oriented to person, place, and time. He has normal strength. A sensory deficit (mild to light touch 4th/5th L digits) is present. Coordination and gait normal.  Reflex Scores:      Bicep reflexes are 1+ on the right side and 1+ on the left side. 5/5 strength BUE Neg spurling  Skin: Skin is warm and dry. No rash noted.  Psychiatric: He has a normal mood and affect.  Nursing note and vitals reviewed.  Results for orders placed or performed in visit on 10/25/15  HM DIABETES EYE EXAM  Result Value Ref Range   HM Diabetic Eye Exam No Retinopathy No Retinopathy      Assessment & Plan:  Problem List Items Addressed This Visit    Cervical neck pain with evidence of disc disease - Primary    Known lumbar DDD. ?cervical DDD vs discopathic pain.  Given duration and risk factors (h/o renal cancer, diabetic), will start with xrays of cervical spine. Treat conservatively with stretching exercises, tylenol/flexeril. Update after 1-2 wks with effect - if no better, low threshold to obtain cervical MRI. Pt agrees with plan.      Relevant Medications   cyclobenzaprine (FLEXERIL) 10 MG tablet   Other Relevant Orders   DG Cervical Spine Complete       Follow up plan: Return if symptoms worsen or fail  to improve.  Ria Bush, MD

## 2016-01-05 NOTE — Patient Instructions (Addendum)
Xray of cervical spine today. Treat with tylenol and flexeril (muscle relaxant - caution it can make you sleepy). Stretching exercises provided today. If not improving over next 1-2 wks, let me know for cervical MRI.  Good to see you today, call us with questions.

## 2016-01-05 NOTE — Progress Notes (Signed)
Pre visit review using our clinic review tool, if applicable. No additional management support is needed unless otherwise documented below in the visit note. 

## 2016-01-05 NOTE — Assessment & Plan Note (Signed)
Known lumbar DDD. ?cervical DDD vs discopathic pain.  Given duration and risk factors (h/o renal cancer, diabetic), will start with xrays of cervical spine. Treat conservatively with stretching exercises, tylenol/flexeril. Update after 1-2 wks with effect - if no better, low threshold to obtain cervical MRI. Pt agrees with plan.

## 2016-01-08 ENCOUNTER — Encounter: Payer: Self-pay | Admitting: Dietician

## 2016-01-17 ENCOUNTER — Telehealth: Payer: Self-pay | Admitting: Family Medicine

## 2016-01-17 NOTE — Telephone Encounter (Signed)
LMOM that cd was ready for pick up

## 2016-01-17 NOTE — Telephone Encounter (Signed)
Pt called - he is going to a chiropractor and needs his xrays neck area on a cd. His wife will be here tomorrow to pick them up  cb number is [phone] Thank you

## 2016-02-16 ENCOUNTER — Other Ambulatory Visit: Payer: BLUE CROSS/BLUE SHIELD

## 2016-02-16 ENCOUNTER — Other Ambulatory Visit: Payer: Self-pay | Admitting: Family Medicine

## 2016-02-16 DIAGNOSIS — IMO0002 Reserved for concepts with insufficient information to code with codable children: Secondary | ICD-10-CM

## 2016-02-16 DIAGNOSIS — E1165 Type 2 diabetes mellitus with hyperglycemia: Principal | ICD-10-CM

## 2016-02-16 DIAGNOSIS — D649 Anemia, unspecified: Secondary | ICD-10-CM

## 2016-02-16 DIAGNOSIS — E1140 Type 2 diabetes mellitus with diabetic neuropathy, unspecified: Secondary | ICD-10-CM

## 2016-02-16 DIAGNOSIS — E785 Hyperlipidemia, unspecified: Secondary | ICD-10-CM

## 2016-02-21 ENCOUNTER — Ambulatory Visit: Payer: BLUE CROSS/BLUE SHIELD | Admitting: Family Medicine

## 2016-06-03 DIAGNOSIS — N500 Atrophy of testis: Secondary | ICD-10-CM | POA: Diagnosis not present

## 2016-06-03 DIAGNOSIS — N401 Enlarged prostate with lower urinary tract symptoms: Secondary | ICD-10-CM | POA: Diagnosis not present

## 2016-06-03 DIAGNOSIS — N400 Enlarged prostate without lower urinary tract symptoms: Secondary | ICD-10-CM | POA: Diagnosis not present

## 2016-06-03 DIAGNOSIS — N5201 Erectile dysfunction due to arterial insufficiency: Secondary | ICD-10-CM | POA: Diagnosis not present

## 2016-06-03 DIAGNOSIS — Z85520 Personal history of malignant carcinoid tumor of kidney: Secondary | ICD-10-CM | POA: Diagnosis not present

## 2016-06-03 DIAGNOSIS — R351 Nocturia: Secondary | ICD-10-CM | POA: Diagnosis not present

## 2016-06-03 DIAGNOSIS — N200 Calculus of kidney: Secondary | ICD-10-CM | POA: Diagnosis not present

## 2016-06-12 ENCOUNTER — Other Ambulatory Visit: Payer: Self-pay | Admitting: Family Medicine

## 2016-06-13 ENCOUNTER — Encounter: Payer: Self-pay | Admitting: Family Medicine

## 2016-06-13 ENCOUNTER — Ambulatory Visit (INDEPENDENT_AMBULATORY_CARE_PROVIDER_SITE_OTHER): Payer: BLUE CROSS/BLUE SHIELD | Admitting: Family Medicine

## 2016-06-13 DIAGNOSIS — Z23 Encounter for immunization: Secondary | ICD-10-CM | POA: Diagnosis not present

## 2016-06-13 DIAGNOSIS — E1140 Type 2 diabetes mellitus with diabetic neuropathy, unspecified: Secondary | ICD-10-CM

## 2016-06-13 DIAGNOSIS — E1165 Type 2 diabetes mellitus with hyperglycemia: Secondary | ICD-10-CM

## 2016-06-13 DIAGNOSIS — Z0000 Encounter for general adult medical examination without abnormal findings: Secondary | ICD-10-CM

## 2016-06-13 DIAGNOSIS — E785 Hyperlipidemia, unspecified: Secondary | ICD-10-CM | POA: Diagnosis not present

## 2016-06-13 DIAGNOSIS — IMO0002 Reserved for concepts with insufficient information to code with codable children: Secondary | ICD-10-CM

## 2016-06-13 LAB — COMPREHENSIVE METABOLIC PANEL
ALT: 38 U/L (ref 0–53)
AST: 25 U/L (ref 0–37)
Albumin: 4.5 g/dL (ref 3.5–5.2)
Alkaline Phosphatase: 52 U/L (ref 39–117)
BUN: 17 mg/dL (ref 6–23)
CO2: 28 mEq/L (ref 19–32)
Calcium: 9.4 mg/dL (ref 8.4–10.5)
Chloride: 101 mEq/L (ref 96–112)
Creatinine, Ser: 0.97 mg/dL (ref 0.40–1.50)
GFR: 84.44 mL/min (ref >60.00)
Glucose, Bld: 179 mg/dL — ABNORMAL HIGH (ref 70–99)
Potassium: 4.1 mEq/L (ref 3.5–5.1)
Sodium: 137 mEq/L (ref 135–145)
Total Bilirubin: 0.4 mg/dL (ref 0.2–1.2)
Total Protein: 7.6 g/dL (ref 6.0–8.3)

## 2016-06-13 LAB — CBC WITH DIFFERENTIAL/PLATELET
Basophils Absolute: 0 10*3/uL (ref 0.0–0.1)
Basophils Relative: 0.6 % (ref 0.0–3.0)
Eosinophils Absolute: 0.1 10*3/uL (ref 0.0–0.7)
Eosinophils Relative: 1.8 % (ref 0.0–5.0)
HCT: 36.6 % — ABNORMAL LOW (ref 39.0–52.0)
Hemoglobin: 11.8 g/dL — ABNORMAL LOW (ref 13.0–17.0)
Lymphocytes Relative: 35.8 % (ref 12.0–46.0)
Lymphs Abs: 2.8 10*3/uL (ref 0.7–4.0)
MCHC: 32.2 g/dL (ref 30.0–36.0)
MCV: 70.3 fl — ABNORMAL LOW (ref 78.0–100.0)
Monocytes Absolute: 0.6 10*3/uL (ref 0.1–1.0)
Monocytes Relative: 7.9 % (ref 3.0–12.0)
Neutro Abs: 4.1 10*3/uL (ref 1.4–7.7)
Neutrophils Relative %: 53.9 % (ref 43.0–77.0)
Platelets: 228 10*3/uL (ref 150.0–400.0)
RBC: 5.21 Mil/uL (ref 4.22–5.81)
RDW: 14.4 % (ref 11.5–15.5)
WBC: 7.7 10*3/uL (ref 4.0–10.5)

## 2016-06-13 LAB — LIPID PANEL
Cholesterol: 188 mg/dL (ref 0–200)
HDL: 50.4 mg/dL (ref >39.00)
NonHDL: 137.87
Total CHOL/HDL Ratio: 4
Triglycerides: 205 mg/dL — ABNORMAL HIGH (ref 0.0–149.0)
VLDL: 41 mg/dL — ABNORMAL HIGH (ref 0.0–40.0)

## 2016-06-13 LAB — HEMOGLOBIN A1C: Hgb A1c MFr Bld: 8.1 % — ABNORMAL HIGH (ref 4.6–6.5)

## 2016-06-13 NOTE — Progress Notes (Signed)
Subjective:    Patient ID: Blake Manning, male    DOB: [birth year], 60 y.o.   MRN: [id number]  HPI This is a 60 yo male who presents today for CPE.  Blood sugars- fasting 130, can go up to 200s if dietary indiscretion 1-2 times a week. Did not finish diabetes education. [employer] a lot for his job. Eats fast food when traveling. Doesn't drink soda/sweet tea or juice, avoids concentrated sweets.   Last CPE- 06/12/15 PSA- 06/06/15 Colonoscopy- 2012 Tdap- 2011 Flu- today Dental- not regular, last 2014 Eye- with in last year, goes annually Exercise- has yard work/horses that keep him busy  Past Medical History:  Diagnosis Date  . DDD (degenerative disc disease), lumbar 01/2014   severe by CT scan at L4/5 and mod at L3/4 and L5/S1, severe R L4/5 and L L5/S1 neural foraminal narrowing  . Diabetes type 2, controlled ([place]) 2010   referred for DSME (05/2013) but pt did not return phone call  . Fatty liver 01/2014   by CT scan  . History of kidney stones    uric acid  . Hyperlipidemia   . Renal cancer ([place]) 2009   part of R kidney removed (Dr. 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 KNEE SURGERY Left 1980s   ?meniscus  . PARTIAL NEPHRECTOMY Right 2009   RCC, negative margins   Social History  Substance Use Topics  . Smoking status: Never Smoker  . Smokeless tobacco: Never Used  . Alcohol use No   Family History  Problem Relation Age of Onset  . Cancer Mother     lung (smoker)  . Diabetes Mother   . Cancer Maternal Aunt     lung (smoker)  . Cancer Maternal Aunt     lung with met to brain (smoker)  . Valvular heart disease Son 59    extra heart valves s/p recent procedure  . Stroke Neg Hx   . Hypertension Son   . Hyperlipidemia Son      Review of Systems  Constitutional: Negative for activity change, appetite change,  fatigue and fever.  HENT: Negative for congestion and dental problem.   Eyes: Negative.   Respiratory: Negative.   Cardiovascular: Negative.   Gastrointestinal: Negative.   Endocrine: Negative.   Genitourinary: Negative.   Musculoskeletal: Positive for arthralgias (daily, takes occasional ibuprofen).  Neurological: Negative for light-headedness and headaches.       Objective:   Physical Exam Physical Exam  Constitutional: He is oriented to person, place, and time. He appears well-developed and well-nourished.  HENT:  Head: Normocephalic and atraumatic.  Right Ear: External ear normal.  Left Ear: External ear normal.  Nose: Nose normal.  Mouth/Throat: Oropharynx is clear and moist.  Eyes: Conjunctivae are normal. Pupils are equal, round, and reactive to light.  Neck: Normal range of motion. Neck supple.  Cardiovascular: Normal rate, regular rhythm, normal heart sounds and intact distal pulses.   Pulmonary/Chest: Effort normal and breath sounds normal.  Abdominal: Soft. Bowel sounds are normal. Hernia confirmed negative in the right inguinal area and confirmed negative in the left inguinal area.  Musculoskeletal: Normal range of motion. He exhibits no edema or tenderness.       Cervical back: Normal.       Thoracic back: Normal.       Lumbar back: Normal.  Lymphadenopathy:  He has no cervical adenopathy.       Right: No inguinal adenopathy present.       Left: No inguinal adenopathy present.  Neurological: He is alert and oriented to person, place, and time. He has normal reflexes.  Skin: Skin is warm and dry.  Psychiatric: He has a normal mood and affect. His behavior is normal. Judgment normal.  Vitals reviewed.     BP 120/72 (BP Location: Left Arm, Patient Position: Sitting, Cuff Size: Normal)   Pulse 70   Temp 98.2 F (36.8 C) (Oral)   Ht '5\' 7"'$  (1.702 m)   Wt 196 lb (88.9 kg)   SpO2 98%   BMI 30.70 kg/m  Wt Readings from Last 3 Encounters:  06/13/16 196 lb  (88.9 kg)  01/05/16 196 lb (88.9 kg)  11/14/15 197 lb (89.4 kg)       Assessment & Plan:  1. Annual physical exam - Discussed and encouraged healthy lifestyle choices- adequate sleep, regular exercise, stress management and healthy food choices.    2. Need for influenza vaccination - Flu Vaccine QUAD 36+ mos PF IM (Fluarix & Fluzone Quad PF)  3. Type 2 diabetes, uncontrolled, with neuropathy ([place]) - encouraged him to increase vegetables and fruits, avoid processed food, fast food - CBC with Differential/Platelet - Lipid panel - Hemoglobin A1c - Comprehensive metabolic panel  4. Hyperlipidemia, unspecified hyperlipidemia type - Lipid panel - Comprehensive metabolic panel  - follow up with Dr. Danise Mina in 6 months Clarene Reamer, FNP-BC  [facility] [place]  06/13/2016 1:51 PM

## 2016-06-13 NOTE — Patient Instructions (Signed)
Please schedule a visit with your dentist  Please find your colonoscopy records and see when you are due, we will be happy to place a referral  Keeping you healthy  Get these tests  Blood pressure- Have your blood pressure checked once a year by your healthcare provider.  Normal blood pressure is 120/80  Weight- Have your body mass index (BMI) calculated to screen for obesity.  BMI is a measure of body fat based on height and weight. You can also calculate your own BMI at ViewBanking.si.  Cholesterol- Have your cholesterol checked every year.  Diabetes- Have your blood sugar checked regularly if you have high blood pressure, high cholesterol, have a family history of diabetes or if you are overweight.  Screening for Colon Cancer- Colonoscopy starting at age 60.  Screening may begin sooner depending on your family history and other health conditions. Follow up colonoscopy as directed by your Gastroenterologist.  Screening for Prostate Cancer- Both blood work (PSA) and a rectal exam help screen for Prostate Cancer.  Screening begins at age 43 with African-American men and at age 90 with Caucasian men.  Screening may begin sooner depending on your family history.  Take these medicines  Aspirin- One aspirin daily can help prevent Heart disease and Stroke.  Flu shot- Every fall.  Tetanus- Every 10 years.  Zostavax- Once after the age of 97 to prevent Shingles.  Pneumonia shot- Once after the age of 23; if you are younger than 61, ask your healthcare provider if you need a Pneumonia shot.  Take these steps  Don't smoke- If you do smoke, talk to your doctor about quitting.  For tips on how to quit, go to www.smokefree.gov or call 1-800-QUIT-NOW.  Be physically active- Exercise 5 days a week for at least 30 minutes.  If you are not already physically active start slow and gradually work up to 30 minutes of moderate physical activity.  Examples of moderate activity include  walking briskly, mowing the yard, dancing, swimming, bicycling, etc.  Eat a healthy diet- Eat a variety of healthy food such as fruits, vegetables, low fat milk, low fat cheese, yogurt, lean meant, poultry, fish, beans, tofu, etc. For more information go to www.thenutritionsource.org  Drink alcohol in moderation- Limit alcohol intake to less than two drinks a day. Never drink and drive.  Dentist- Brush and floss twice daily; visit your dentist twice a year.  Depression- Your emotional health is as important as your physical health. If you're feeling down, or losing interest in things you would normally enjoy please talk to your healthcare provider.  Eye exam- Visit your eye doctor every year.  Safe sex- If you may be exposed to a sexually transmitted infection, use a condom.  Seat belts- Seat belts can save your life; always wear one.  Smoke/Carbon Monoxide detectors- These detectors need to be installed on the appropriate level of your home.  Replace batteries at least once a year.  Skin cancer- When out in the sun, cover up and use sunscreen 15 SPF or higher.  Violence- If anyone is threatening you, please tell your healthcare provider.  Living Will/ Health care power of attorney- Speak with your healthcare provider and family.

## 2016-06-13 NOTE — Progress Notes (Signed)
Pre visit review using our clinic review tool, if applicable. No additional management support is needed unless otherwise documented below in the visit note. 

## 2016-06-14 LAB — LDL CHOLESTEROL, DIRECT: Direct LDL: 113 mg/dL

## 2016-07-17 ENCOUNTER — Other Ambulatory Visit: Payer: Self-pay | Admitting: Family Medicine

## 2016-10-30 ENCOUNTER — Emergency Department
Admission: EM | Admit: 2016-10-30 | Discharge: 2016-10-30 | Disposition: A | Discharge status: Home / Self Care | Payer: BC Managed Care – PPO | Attending: Internal Medicine | Admitting: Internal Medicine

## 2016-10-30 ENCOUNTER — Emergency Department (HOSPITAL_COMMUNITY): Payer: BC Managed Care – PPO

## 2016-10-30 ENCOUNTER — Encounter (HOSPITAL_COMMUNITY): Payer: Self-pay

## 2016-10-30 DIAGNOSIS — E119 Type 2 diabetes mellitus without complications: Secondary | ICD-10-CM | POA: Insufficient documentation

## 2016-10-30 DIAGNOSIS — R55 Syncope and collapse: Principal | ICD-10-CM | POA: Insufficient documentation

## 2016-10-30 DIAGNOSIS — J8410 Pulmonary fibrosis, unspecified: Secondary | ICD-10-CM | POA: Insufficient documentation

## 2016-10-30 DIAGNOSIS — R42 Dizziness and giddiness: Secondary | ICD-10-CM

## 2016-10-30 DIAGNOSIS — Z87891 Personal history of nicotine dependence: Secondary | ICD-10-CM | POA: Insufficient documentation

## 2016-10-30 HISTORY — DX: Type 2 diabetes mellitus without complications (CMS HCC): E11.9

## 2016-10-30 HISTORY — DX: Presence of spectacles and contact lenses: Z97.3

## 2016-10-30 LAB — PTT (PARTIAL THROMBOPLASTIN TIME): APTT: 29 s (ref 26.0–39.0)

## 2016-10-30 LAB — CBC WITH DIFF
BASOPHIL #: 0.1 x10ˆ3/uL (ref 0.00–1.00)
BASOPHIL %: 1 % (ref 0–1)
EOSINOPHIL #: 0.1 x10ˆ3/uL (ref 0.00–0.40)
EOSINOPHIL %: 1 % (ref 1–5)
HCT: 38.6 % — ABNORMAL LOW (ref 42.0–52.0)
HGB: 12.6 g/dL — ABNORMAL LOW (ref 14.0–18.0)
LYMPHOCYTE #: 2 x10ˆ3/uL (ref 1.00–4.00)
LYMPHOCYTE %: 18 % — ABNORMAL LOW (ref 25–40)
MCH: 22.7 pg — ABNORMAL LOW (ref 27.0–31.0)
MCHC: 32.6 g/dL — ABNORMAL LOW (ref 33.0–37.0)
MCV: 69.5 fL — ABNORMAL LOW (ref 80.0–94.0)
MONOCYTE #: 0.7 x10ˆ3/uL (ref 0.10–0.80)
MONOCYTE %: 6 % (ref 4–10)
NEUTROPHIL #: 8.2 x10ˆ3/uL — ABNORMAL HIGH (ref 1.80–7.00)
NEUTROPHIL %: 74 % — ABNORMAL HIGH (ref 50–73)
PLATELETS: 220 x10ˆ3/uL (ref 130–400)
RBC: 5.55 x10ˆ6/uL (ref 4.70–6.10)
RDW: 14.8 % — ABNORMAL HIGH (ref 11.5–14.5)
WBC: 11.1 x10ˆ3/uL — ABNORMAL HIGH (ref 4.8–10.8)

## 2016-10-30 LAB — ECG 12 LEAD
Atrial Rate: 65 {beats}/min
Atrial Rate: 69 {beats}/min
Calculated P Axis: 25 degrees
Calculated P Axis: 24 degrees
Calculated R Axis: 10 degrees
Calculated R Axis: -5 degrees
Calculated T Axis: 39 degrees
Calculated T Axis: 32 degrees
PR Interval: 158 ms
PR Interval: 154 ms
QRS Duration: 96 ms
QRS Duration: 96 ms
QRS Duration: 92 ms
QT Interval: 426 ms
QT Interval: 384 ms
QTC Calculation: 443 ms
QTC Calculation: 411 ms
Ventricular rate: 65 {beats}/min
Ventricular rate: 69 {beats}/min

## 2016-10-30 LAB — COMPREHENSIVE METABOLIC PANEL, NON-FASTING
ALBUMIN: 5 g/dL — ABNORMAL HIGH (ref 3.6–4.8)
ALKALINE PHOSPHATASE: 70 U/L (ref 38–126)
ALT (SGPT): 60 U/L — ABNORMAL HIGH (ref 3–45)
ANION GAP: 16 mmol/L
AST (SGOT): 39 U/L (ref 7–56)
BILIRUBIN TOTAL: 0.4 mg/dL (ref 0.2–1.3)
BUN/CREA RATIO: 15
BUN: 12 mg/dL (ref 9–21)
CALCIUM: 10.2 mg/dL (ref 8.5–10.3)
CALCIUM: 10.2 mg/dL (ref 8.5–10.3)
CHLORIDE: 102 mmol/L (ref 101–111)
CO2 TOTAL: 27 mmol/L (ref 22–31)
CREATININE: 0.8 mg/dL (ref 0.70–1.40)
ESTIMATED GFR: 99 mL/min/1.73mˆ2 (ref >=60)
GLUCOSE: 200 mg/dL — ABNORMAL HIGH (ref 68–99)
POTASSIUM: 4.1 mmol/L (ref 3.6–5.0)
PROTEIN TOTAL: 8.3 g/dL — ABNORMAL HIGH (ref 6.2–8.0)
SODIUM: 145 mmol/L (ref 137–145)

## 2016-10-30 LAB — CK-MB
CK-MB: 0.4 ng/mL (ref 0.0–2.4)
CREATINE KINASE TOTAL: 64 IU/L (ref 0–200)

## 2016-10-30 LAB — POC BLOOD GLUCOSE (RESULTS): GLUCOSE, POC: 166 mg/dL (ref 80–130)

## 2016-10-30 LAB — PT/INR
INR: 0.98 (ref <=4.00)
PROTHROMBIN TIME: 11.3 s (ref 9.5–12.9)

## 2016-10-30 LAB — LDL CHOLESTEROL, DIRECT
LDL DIRECT: 117 mg/dL (ref 60–129)
LDL DIRECT: 117 mg/dL (ref 60–129)

## 2016-10-30 LAB — TROPONIN-I
TROPONIN I: <0.01 ng/mL (ref 0.00–0.03)
TROPONIN I: <0.01 ng/mL (ref 0.00–0.03)

## 2016-10-30 MED ORDER — SODIUM CHLORIDE 0.9 % (FLUSH) INJECTION SYRINGE
3.0000 mL | INJECTION | INTRAMUSCULAR | Status: DC | PRN
Start: 2016-10-30 — End: 2016-10-30

## 2016-10-30 MED ORDER — ASPIRIN 81 MG CHEWABLE TABLET
CHEWABLE_TABLET | ORAL | Status: AC
Start: 2016-10-30 — End: 2016-10-30
  Filled 2016-10-30: qty 4

## 2016-10-30 MED ORDER — ONDANSETRON HCL (PF) 4 MG/2 ML INJECTION SOLUTION
4.00 mg | INTRAMUSCULAR | Status: AC
Start: 2016-10-30 — End: 2016-10-30
  Administered 2016-10-30: 4 mg via INTRAVENOUS

## 2016-10-30 MED ORDER — ONDANSETRON HCL (PF) 4 MG/2 ML INJECTION SOLUTION
INTRAMUSCULAR | Status: AC
Start: 2016-10-30 — End: 2016-10-30
  Filled 2016-10-30: qty 2

## 2016-10-30 MED ORDER — ASPIRIN 81 MG CHEWABLE TABLET
324.0000 mg | CHEWABLE_TABLET | ORAL | Status: AC
Start: 2016-10-30 — End: 2016-10-30
  Administered 2016-10-30 (×2): 324 mg via ORAL

## 2016-10-30 MED ORDER — SODIUM CHLORIDE 0.9 % (FLUSH) INJECTION SYRINGE
3.0000 mL | INJECTION | Freq: Three times a day (TID) | INTRAMUSCULAR | Status: DC
Start: 2016-10-30 — End: 2016-10-30

## 2016-10-30 MED ORDER — SODIUM CHLORIDE 0.9 % IV BOLUS
1000.00 mL | INJECTION | Status: AC
Start: 2016-10-30 — End: 2016-10-30
  Administered 2016-10-30: 1000 mL via INTRAVENOUS

## 2016-10-30 NOTE — ED Triage Notes (Signed)
Today 12:30 PM  ( 4) HOURS AGO

## 2016-10-30 NOTE — ED Provider Notes (Signed)
Emergency Department  Provider Note  HPI - 10/30/2016      Name: George Blair  Age and Gender: 61 y.o. male  Attending: Dr. Yong Channel  APP: Leo Grosser PA-C      History provided by: Patient    HPI:  George Blair is a 61 y.o. male  who presents to the ED today for a dizziness that began just after lunch at approximately 1230.Marland Kitchen Pt reports that he was feeling fine before lunch. He ate taco salad for lunch, and afterwards, he began to feel disoriented, generally weak, and dizzy. He also reports feeling nauseous and diaphoretic. He states that he felt like he had a fluttering in his chest with mild chest pressure, and he c/o headache that he rates as 2/10. He also reports that he was mildly sensitive to light for a while, but that has resolved. Nothing specifically exacerbates or relieves his symptoms. Pt denies personal hx of heart problems other than having a heart murmur as a child. Denies family hx of heart problems as well. Denies taking daily blood thinner. Reports hx of DM II that is medication controlled. Allergy to Codeine. Smokes 1/2 ppd.      PCP: In [place] - Dr. Sharen Hones    Location: Neuro  Quality: Dizzy  Onset: 1230  Severity: 2/10  Timing: Still present  Context: See HPI  Modifying factors: None noted  Associated symptoms: +Disoriented. +Generally weak. + Nausea. +Diaphoresis. +Palpitations and chest pressure. +Headache. +Sensitivity to light - resolved.        Review of Systems:  Review of Systems   Constitutional: Positive for diaphoresis. Negative for chills and fever.   HENT: Negative for ear pain and sore throat.         No head injury.    Eyes: Negative for blurred vision, double vision, discharge and redness.        Mild sensitivity to light has resolved.    Respiratory: Negative for cough, shortness of breath and wheezing.    Cardiovascular: Positive for chest pain and palpitations.   Gastrointestinal: Positive for nausea. Negative for abdominal pain, constipation, diarrhea and  vomiting.   Genitourinary: Negative for dysuria, frequency and hematuria.   Musculoskeletal: Negative for back pain, joint pain and neck pain.   Skin: Negative for rash.   Neurological: Positive for dizziness, weakness and headaches. Negative for sensory change, speech change, focal weakness and loss of consciousness.        +Feels disoriented.    All other systems reviewed and are negative.        Below information reviewed with patient:   No current outpatient prescriptions on file.       Allergies   Allergen Reactions   . Codeine  Other Adverse Reaction (Add comment)     INCREASE HEART RATE, NAUSEA          Past Medical History:  Past Medical History:   Diagnosis Date   . Diabetes mellitus ([facility])    . Wears glasses        Past Surgical History:  History reviewed. No pertinent surgical history.    Social History:  Social History   Substance Use Topics   . Smoking status: Former Smoker     Packs/day: 0.50     Years: 1.00   . Smokeless tobacco: Never Used   . Alcohol use No     History   Drug Use No       Family History:  No family history on file.  Old records reviewed.      Objective:  Nursing notes reviewed    Filed Vitals:    10/30/16 1642   BP: (!) 150/80   Pulse: 65   Resp: 18   Temp: 36.5 C (97.7 F)   SpO2: 98%         Physical Exam  Physical Exam   Constitutional: He is oriented to person, place, and time and well-developed, well-nourished, and in no distress.   HENT:   Head: Normocephalic and atraumatic.   Eyes: Conjunctivae and EOM are normal. Pupils are equal, round, and reactive to light.   Neck: Normal range of motion. Neck supple.   Cardiovascular: Normal rate, regular rhythm and intact distal pulses.  Exam reveals no gallop and no friction rub.    No murmur heard.  Pulmonary/Chest: Effort normal and breath sounds normal. No respiratory distress. He has no wheezes.   Abdominal: Soft. He exhibits no distension and no mass. There is no tenderness. There is no rebound and no guarding.    Musculoskeletal: Normal range of motion. He exhibits no edema, tenderness or deformity.   Neurological: He is alert and oriented to person, place, and time. No cranial nerve deficit.   Skin: Skin is warm and dry. No rash noted.   Psychiatric: Mood and affect normal.   Nursing note and vitals reviewed.      Work-up:  Orders Placed This Encounter   . XR AP MOBILE CHEST   . CT BRAIN WO IV CONTRAST   . TROPONIN-I (SERIAL TROPONINS)   . CBC/DIFF   . COMPREHENSIVE METABOLIC PANEL, NON-FASTING   . LDL CHOLESTEROL, DIRECT   . PT/INR   . PTT (PARTIAL THROMBOPLASTIN TIME)   . CK-MB   . CBC WITH DIFF   . OXYGEN PROTOCOL   . ECG 12 LEAD- TIME WITH TROPONINS   . PERFORM POC WHOLE BLOOD GLUCOSE   . INSERT & MAINTAIN PERIPHERAL IV ACCESS   . NS flush syringe   . NS flush syringe   . aspirin chewable tablet 324 mg   . NS bolus infusion 1,000 mL   . ondansetron (ZOFRAN) 2 mg/mL injection        Labs:  Results for orders placed or performed during the hospital encounter of 10/30/16 (from the past 24 hour(s))   TROPONIN-I (SERIAL TROPONINS)   Result Value Ref Range    TROPONIN I <0.01 0.00 - 0.03 ng/mL   TROPONIN-I (SERIAL TROPONINS)   Result Value Ref Range    TROPONIN I <0.01 0.00 - 0.03 ng/mL   CBC/DIFF    Narrative    The following orders were created for panel order CBC/DIFF.  Procedure                               Abnormality         Status                     ---------                               -----------         ------                     CBC WITH XLKG[[id number]]                Abnormal  Final result                 Please view results for these tests on the individual orders.   COMPREHENSIVE METABOLIC PANEL, NON-FASTING   Result Value Ref Range    SODIUM 145 137 - 145 mmol/L    POTASSIUM 4.1 3.6 - 5.0 mmol/L    CHLORIDE 102 101 - 111 mmol/L    CO2 TOTAL 27 22 - 31 mmol/L    ANION GAP 16 mmol/L    BUN 12 9 - 21 mg/dL    CREATININE 1.61 0.96 - 1.40 mg/dL    BUN/CREA RATIO 15     ESTIMATED GFR 99 >=60  mL/min/1.16m^2    ALBUMIN 5.0 (H) 3.6 - 4.8 g/dL    CALCIUM 04.5 8.5 - 40.9 mg/dL    GLUCOSE 811 (H) 68 - 99 mg/dL    ALKALINE PHOSPHATASE 70 38 - 126 U/L    ALT (SGPT) 60 (H) 3 - 45 U/L    AST (SGOT) 39 7 - 56 U/L    BILIRUBIN TOTAL 0.4 0.2 - 1.3 mg/dL    PROTEIN TOTAL 8.3 (H) 6.2 - 8.0 g/dL   LDL CHOLESTEROL, DIRECT   Result Value Ref Range    LDL DIRECT 117 60 - 129 mg/dL   PT/INR   Result Value Ref Range    PROTHROMBIN TIME 11.3 9.5 - 12.9 seconds    INR 0.98 <=4.00   PTT (PARTIAL THROMBOPLASTIN TIME)   Result Value Ref Range    APTT 29.0 26.0 - 39.0 seconds   CK-MB   Result Value Ref Range    CREATINE KINASE TOTAL 64 0 - 200 IU/L      CK-MB 0.4 0.0 - 2.4 ng/mL   CBC WITH DIFF   Result Value Ref Range    WBC 11.1 (H) 4.8 - 10.8 x10^3/uL    RBC 5.55 4.70 - 6.10 x10^6/uL    HGB 12.6 (L) 14.0 - 18.0 g/dL    HCT 91.4 (L) 78.2 - 52.0 %    MCV 69.5 (L) 80.0 - 94.0 fL    MCH 22.7 (L) 27.0 - 31.0 pg    MCHC 32.6 (L) 33.0 - 37.0 g/dL    RDW 95.6 (H) 21.3 - 14.5 %    PLATELETS 220 130 - 400 x10^3/uL    NEUTROPHIL % 74 (H) 50 - 73 %    LYMPHOCYTE % 18 (L) 25 - 40 %    MONOCYTE % 6 4 - 10 %    EOSINOPHIL % 1 1 - 5 %    BASOPHIL % 1 0 - 1 %    NEUTROPHIL # 8.20 (H) 1.80 - 7.00 x10^3/uL    LYMPHOCYTE # 2.00 1.00 - 4.00 x10^3/uL    MONOCYTE # 0.70 0.10 - 0.80 x10^3/uL    EOSINOPHIL # 0.10 0.00 - 0.40 x10^3/uL    BASOPHIL # 0.10 0.00 - 1.00 x10^3/uL   POC BLOOD GLUCOSE (RESULTS)   Result Value Ref Range    GLUCOSE, POC 166 Fasting: 80-130 mg/dL; 2 HR PC: <086 mg/dL mg/dL       Abnormal Lab results:  Labs Reviewed   COMPREHENSIVE METABOLIC PANEL, NON-FASTING - Abnormal; Notable for the following:        Result Value    ALBUMIN 5.0 (*)     GLUCOSE 200 (*)     ALT (SGPT) 60 (*)     PROTEIN TOTAL 8.3 (*)     All other components within normal limits  CBC WITH DIFF - Abnormal; Notable for the following:     WBC 11.1 (*)     HGB 12.6 (*)     HCT 38.6 (*)     MCV 69.5 (*)     MCH 22.7 (*)     MCHC 32.6 (*)     RDW 14.8 (*)      NEUTROPHIL % 74 (*)     LYMPHOCYTE % 18 (*)     NEUTROPHIL # 8.20 (*)     All other components within normal limits   TROPONIN-I - Normal   TROPONIN-I - Normal   LDL CHOLESTEROL, DIRECT - Normal   PT/INR - Normal   PTT (PARTIAL THROMBOPLASTIN TIME) - Normal   CK-MB - Normal   POC BLOOD GLUCOSE (RESULTS) - Normal   CBC/DIFF    Narrative:     The following orders were created for panel order CBC/DIFF.  Procedure                               Abnormality         Status                     ---------                               -----------         ------                     CBC WITH WUXL[[id number]]                Abnormal            Final result                 Please view results for these tests on the individual orders.   TROPONIN-I   PERFORM POC WHOLE BLOOD GLUCOSE       Imaging:   Results for orders placed or performed during the hospital encounter of 10/30/16 (from the past 72 hour(s))   XR AP MOBILE CHEST     Status: None    Narrative    Portable chest x-ray:    CLINICAL HISTORY: Dizziness with nausea and lightheadedness.    A single frontal portable view of the chest was obtained. The heart size is  normal. The lungs are grossly clear. There is a calcified granuloma at the  left lung base. There is no definite acute infiltrate, pneumothorax, or  pleural effusion.      Impression    1. Evidence of prior granulomatous disease but no acute cardiopulmonary  disease.     CT BRAIN WO IV CONTRAST     Status: None    Narrative    CT head without IV contrast.  HISTORY: New onset this afternoon of blurred vision, dizziness and  disorientation. Slight headache.  This CT scanner is equipped with dose reducing technology, including  automated exposure control.   CT dose 1126 DLP     The ventricles and sulci are normal size for age. No acute intra or  extra-axial hemorrhage, edema or abnormal extra-axial fluid collection is  seen.    The gray-white matter contrast, basal ganglia, thalami, brainstem  and posterior fossa appear  normal.  No gross mass is apparent.    The paranasal sinuses and mastoid air cells are clear.  Impression    1. No hemorrhage, infarct or gross mass is currently apparent. If you  suspect occult infarct or tumor, further evaluation with MRI is  recommended.           ECG:  ECG performed on 10/30/2016 at 14:26:02.   ECG was reviewed at 1628 by Dr. Paulla Dolly, and the interpretation is as follows - Rate: 65 NSR, Normal ECG, NO STEMI.     Repeat ECG:  ECG performed on 10/30/2016 at 20:01:19.   ECG was reviewed at Dr. Clement Sayres, and the interpretation is as follows - Rate: 69 NSR, Normal ECG, NO STEMI.     Plan: Appropriate labs and/or imaging ordered. Medical Records reviewed.    MDM:    During the patient's stay in the emergency department, the above listed imaging and/or labs were performed to assist with medical decision making and were reviewed by myself as available for review.    1638: Blood glucose finger stick is 166.    1755: Offered pt admission, but he refuses admission.  He reports he would rather go home and f/u with his provider in [place] next week. Because of this, offered pt delta troponin's. If these are negative, pt can be d/c as long as he has close f/u with his provider. While waiting on repeat troponin's, he will consider possibility of admission. If pt changes his mind, he will let someone know.    1913: Rechecked pt. He reports that he is feeling better at this time.    2013: Rechecked pt. He reports continuing to feel better, and he thinks he could eat now. He would still like to be d/c.    Pt rechecked and remained stable throughout the emergency department course.     Advised that pt return to ED for any new or worsening symptoms; otherwise, f/u with PCP ASAP.    ED Course     HEART Score:    History: Moderately suspicious  EKG: Normal  Age: 14 to 51  Risk Factors: 3 or more risk factors or history of atherosclerotic disease  Troponin: Normal limit  Total Score: 4    Impression:    Encounter Diagnosis   Name Primary?   . Near syncope Yes     Disposition:  Discharged     Discussed with patient all lab and/or imaging results, diagnosis, treatment, and need for follow up.    It was advised that the patient return to the ED with any new, concerning, or worsening symptoms and follow up as directed.    The patient verbalized understanding of all instructions and had no further questions or concerns.     Follow up:   Eustaquio Boyden, MD  [address] [place] [address]  [phone]    Schedule an appointment as soon as possible for a visit        I am scribing for, and in the presence of, Leo Grosser PA-C for services provided on 10/30/2016.  Britney Lipps, SCRIBE     [place], SCRIBE  10/30/2016, 16:30    I personally performed the services described in this documentation, as scribed  in my presence, and it is both accurate  and complete.    Leo Grosser, PA-C    I personally performed the services described in this documentation, as scribed in my presence, and it is both accurate and complete.   Leo Grosser, PA-C  The co-signing faculty was physically present in the emergency department and available  for consultation and did not particpate in the care of this patient.  Leo Grosser, PA-C  10/30/2016, 20:41

## 2016-10-30 NOTE — ED Nurses Note (Addendum)
Pt reports feeling "much better." d/c instructions given to pt. Pt encouraged to follow up with pcp as soon as he gets back home. Also encouraged to come back to ED if same symptoms occur again while he is in town. Pt verbalized understanding.

## 2016-10-30 NOTE — Discharge Instructions (Signed)
RETURN TO EMERGENCY DEPT IMMEDIATELY FOR ANY RECURRENT OR WORSENING SYMPTOMS.  FOLLOW UP WITH YOUR FAMILY DOCTOR ASAP WHEN YOU GET BACK TO [place] TO BE RECHECKED.

## 2016-11-01 ENCOUNTER — Ambulatory Visit (INDEPENDENT_AMBULATORY_CARE_PROVIDER_SITE_OTHER): Payer: BLUE CROSS/BLUE SHIELD | Admitting: Family Medicine

## 2016-11-01 ENCOUNTER — Encounter: Payer: Self-pay | Admitting: Family Medicine

## 2016-11-01 VITALS — BP 130/82 | HR 88 | Temp 98.0°F | Wt 193.2 lb

## 2016-11-01 DIAGNOSIS — IMO0002 Reserved for concepts with insufficient information to code with codable children: Secondary | ICD-10-CM

## 2016-11-01 DIAGNOSIS — E1165 Type 2 diabetes mellitus with hyperglycemia: Secondary | ICD-10-CM | POA: Diagnosis not present

## 2016-11-01 DIAGNOSIS — E1140 Type 2 diabetes mellitus with diabetic neuropathy, unspecified: Secondary | ICD-10-CM | POA: Diagnosis not present

## 2016-11-01 DIAGNOSIS — E785 Hyperlipidemia, unspecified: Secondary | ICD-10-CM | POA: Diagnosis not present

## 2016-11-01 DIAGNOSIS — Z85528 Personal history of other malignant neoplasm of kidney: Secondary | ICD-10-CM

## 2016-11-01 DIAGNOSIS — R42 Dizziness and giddiness: Secondary | ICD-10-CM | POA: Diagnosis not present

## 2016-11-01 DIAGNOSIS — R0789 Other chest pain: Secondary | ICD-10-CM

## 2016-11-01 LAB — LDL CHOLESTEROL, DIRECT: Direct LDL: 109 mg/dL (ref <130)

## 2016-11-01 NOTE — Progress Notes (Signed)
BP 130/82   Pulse 88   Temp 98 F (36.7 C) (Oral)   Wt 193 lb 4 oz (87.7 kg)   BMI 30.27 kg/m    CC: ER f/u visit Subjective:    Patient ID: Blake Manning, male    DOB: [birth year], 61 y.o.   MRN: [id number]  HPI: Blake Manning is a 61 y.o. male presenting on 11/01/2016 for Follow-up (ER; still not feeling 100%)   Recent ER evaluation Wednesday of this week at [facility] ER department for dizziness and malaise. Diagnosed with near syncope. Records reviewed. Labs showed Cr 0.8, lytes normal, glu 200, ALP 70, ALT 60, AST 39, TBili 0.4. LDL 117, CK WNL, WBC 11.1, Hgb 12.6, plt 220. CT brain w/o contrast without acute process.   Describes episode of dizziness, clamminess, nauseated, chest "flutter" sensation, started after lunch. Presyncope with blurry vision. Doesn't remember getting to the hospital - coworker drove him. Symptoms worse with neck extension. Felt better after he slept all night. Did drive back from [facility] last night.   Today feeling substernal pulsatile ache and headache at vertex of head. Some intermittent palpitations.   No fevers/chills, dyspnea, coughing, vertigo.   Had similar episode 2 wks ago.   Diabetic controlled on janumet - fasting sugars 130s, postprandial 180-200. He does feel weak when sugars drop below 130.  h/o renal cancer.   Relevant past medical, surgical, family and social history reviewed and updated as indicated. Interim medical history since our last visit reviewed. Allergies and medications reviewed and updated. Outpatient Medications Prior to Visit  Medication Sig Dispense Refill  . Blood Glucose Monitoring Suppl (BLOOD GLUCOSE METER) kit Accu-Chek Comfort Curve Dx: 250.00  Use to check sugar once daily 1 each 0  . gabapentin (NEURONTIN) 100 MG capsule take 2 capsules by mouth every evening at bedtime 180 capsule 1  . glucose blood (ACCU-CHEK AVIVA PLUS) test strip Use to check sugar once daily. Dx: E11.40; E11.65 50 each 3  . JANUMET  50-1000 MG tablet take 1 tablet by mouth twice a day with meals 60 tablet 11  . Potassium Citrate 15 MEQ (1620 MG) TBCR   1  . Red Yeast Rice 600 MG CAPS Take 1 capsule (600 mg total) by mouth daily.    Marland Kitchen aspirin (ASPIRIN EC) 81 MG EC tablet Take 81 mg by mouth every Monday, Wednesday, and Friday. Swallow whole.    . allopurinol (ZYLOPRIM) 300 MG tablet Take 300 mg by mouth daily.    . cyclobenzaprine (FLEXERIL) 10 MG tablet Take 0.5-1 tablets (5-10 mg total) by mouth 2 (two) times daily as needed for muscle spasms (sedation precautions). (Patient not taking: Reported on 06/13/2016) 30 tablet 0   No facility-administered medications prior to visit.      Per HPI unless specifically indicated in ROS section below Review of Systems     Objective:    BP 130/82   Pulse 88   Temp 98 F (36.7 C) (Oral)   Wt 193 lb 4 oz (87.7 kg)   BMI 30.27 kg/m   Wt Readings from Last 3 Encounters:  11/01/16 193 lb 4 oz (87.7 kg)  06/13/16 196 lb (88.9 kg)  01/05/16 196 lb (88.9 kg)    Physical Exam  Constitutional: He is oriented to person, place, and time. He appears well-developed and well-nourished. No distress.  HENT:  Head: Normocephalic and atraumatic.  Mouth/Throat: Oropharynx is clear and moist. No oropharyngeal exudate.  Eyes: Conjunctivae and EOM are normal. Pupils  are equal, round, and reactive to light. No scleral icterus.  Neck: Normal range of motion. Neck supple. Carotid bruit is not present. No thyromegaly present.  Cardiovascular: Normal rate, regular rhythm, normal heart sounds and intact distal pulses.   No murmur heard. Pulmonary/Chest: Effort normal and breath sounds normal. No respiratory distress. He has no wheezes. He has no rales.  Musculoskeletal: He exhibits no edema.  Lymphadenopathy:    He has no cervical adenopathy.  Neurological: He is alert and oriented to person, place, and time. He has normal strength. No cranial nerve deficit or sensory deficit. He displays a  negative Romberg sign. Coordination and gait normal.  CN 2-12 intact Station and gait intact FTN intact EOMI No pronator drift  Skin: Skin is warm and dry. No rash noted.  Psychiatric: He has a normal mood and affect.  Nursing note and vitals reviewed.  Results for orders placed or performed in visit on 06/13/16  CBC with Differential/Platelet  Result Value Ref Range   WBC 7.7 4.0 - 10.5 K/uL   RBC 5.21 4.22 - 5.81 Mil/uL   Hemoglobin 11.8 (L) 13.0 - 17.0 g/dL   HCT 36.6 (L) 39.0 - 52.0 %   MCV 70.3 (L) 78.0 - 100.0 fl   MCHC 32.2 30.0 - 36.0 g/dL   RDW 14.4 11.5 - 15.5 %   Platelets 228.0 150.0 - 400.0 K/uL   Neutrophils Relative % 53.9 43.0 - 77.0 %   Lymphocytes Relative 35.8 12.0 - 46.0 %   Monocytes Relative 7.9 3.0 - 12.0 %   Eosinophils Relative 1.8 0.0 - 5.0 %   Basophils Relative 0.6 0.0 - 3.0 %   Neutro Abs 4.1 1.4 - 7.7 K/uL   Lymphs Abs 2.8 0.7 - 4.0 K/uL   Monocytes Absolute 0.6 0.1 - 1.0 K/uL   Eosinophils Absolute 0.1 0.0 - 0.7 K/uL   Basophils Absolute 0.0 0.0 - 0.1 K/uL  Lipid panel  Result Value Ref Range   Cholesterol 188 0 - 200 mg/dL   Triglycerides 205.0 (H) 0.0 - 149.0 mg/dL   HDL 50.40 >39.00 mg/dL   VLDL 41.0 (H) 0.0 - 40.0 mg/dL   Total CHOL/HDL Ratio 4    NonHDL 137.87   Hemoglobin A1c  Result Value Ref Range   Hgb A1c MFr Bld 8.1 (H) 4.6 - 6.5 %  Comprehensive metabolic panel  Result Value Ref Range   Sodium 137 135 - 145 mEq/L   Potassium 4.1 3.5 - 5.1 mEq/L   Chloride 101 96 - 112 mEq/L   CO2 28 19 - 32 mEq/L   Glucose, Bld 179 (H) 70 - 99 mg/dL   BUN 17 6 - 23 mg/dL   Creatinine, Ser 0.97 0.40 - 1.50 mg/dL   Total Bilirubin 0.4 0.2 - 1.2 mg/dL   Alkaline Phosphatase 52 39 - 117 U/L   AST 25 0 - 37 U/L   ALT 38 0 - 53 U/L   Total Protein 7.6 6.0 - 8.3 g/dL   Albumin 4.5 3.5 - 5.2 g/dL   Calcium 9.4 8.4 - 10.5 mg/dL   GFR 84.44 >60.00 mL/min  LDL cholesterol, direct  Result Value Ref Range   Direct LDL 113.0 mg/dL        Assessment & Plan:   Problem List Items Addressed This Visit    Dizziness - Primary    2 isolated episodes of dizziness, nausea, diaphoresis, and chest "flutter" and discomfort overall atypical for angina. No syncope, not consistent with vertigo.  Given  risk factors (diabetic, HLD), I did recommend further eval by cardiology. Referral placed. I also will order MRI to eval for signs of CVA, bleed, new mass (in h/o kidney cancer), and r/o cerebrovascular ischemia as cause (vertebrobasilar insufficiency).  Check labs today including TnI. rec start aspirin '81mg'$  daily.  Pt/wife agree with plan.       Relevant Orders   Troponin I   TSH   Ambulatory referral to Cardiology   MR Brain W Wo Contrast   MR Angiogram Neck W Wo Contrast   History of renal cell cancer   Relevant Orders   MR Brain W Wo Contrast   Hyperlipidemia    Update dLDL. Pt intolerant to atorvastatin and pravastatin in the past. Continues RYR 1 cap daily. May need to start zetia.       Relevant Medications   aspirin (ASPIRIN EC) 81 MG EC tablet   Other Relevant Orders   LDL Cholesterol, Direct   Ambulatory referral to Cardiology   MR Angiogram Neck W Wo Contrast   Type 2 diabetes, uncontrolled, with neuropathy (HCC)    Update A1c. Pt compliant with janumet      Relevant Medications   aspirin (ASPIRIN EC) 81 MG EC tablet   Other Relevant Orders   Hemoglobin A1c   Ambulatory referral to Cardiology   MR Angiogram Neck W Wo Contrast    Other Visit Diagnoses    Chest discomfort       Relevant Orders   Ambulatory referral to Cardiology       Follow up plan: Return if symptoms worsen or fail to improve.  Ria Bush, MD

## 2016-11-01 NOTE — Assessment & Plan Note (Signed)
Update A1c. Pt compliant with janumet

## 2016-11-01 NOTE — Progress Notes (Signed)
Pre visit review using our clinic review tool, if applicable. No additional management support is needed unless otherwise documented below in the visit note. 

## 2016-11-01 NOTE — Patient Instructions (Signed)
Labs today. We will set up MRI for next week We will refer you to cardiology for further evaluation. Increase aspirin to 81mg  daily.  Continue other medicines as up to now.

## 2016-11-01 NOTE — Assessment & Plan Note (Signed)
Update dLDL. Pt intolerant to atorvastatin and pravastatin in the past. Continues RYR 1 cap daily. May need to start zetia.

## 2016-11-01 NOTE — Assessment & Plan Note (Addendum)
2 isolated episodes of dizziness, nausea, diaphoresis, and chest "flutter" and discomfort overall atypical for angina. No syncope, not consistent with vertigo.  Given risk factors (diabetic, HLD), I did recommend further eval by cardiology. Referral placed. I also will order MRI to eval for signs of CVA, bleed, new mass (in h/o kidney cancer), and r/o cerebrovascular ischemia as cause (vertebrobasilar insufficiency).  Check labs today including TnI. rec start aspirin 81mg  daily.  Pt/wife agree with plan.

## 2016-11-02 ENCOUNTER — Other Ambulatory Visit: Payer: Self-pay | Admitting: Family Medicine

## 2016-11-02 LAB — HEMOGLOBIN A1C
Hgb A1c MFr Bld: 7.6 % — ABNORMAL HIGH (ref <5.7)
Mean Plasma Glucose: 171 mg/dL

## 2016-11-02 LAB — TSH: TSH: 1.13 mIU/L (ref 0.40–4.50)

## 2016-11-02 LAB — TROPONIN I: Troponin I: <0.01 ng/mL (ref <=0.05)

## 2016-11-02 MED ORDER — RED YEAST RICE 600 MG PO CAPS: 1.0000 | ORAL_CAPSULE | Freq: Two times a day (BID) | ORAL | Status: DC

## 2016-11-04 ENCOUNTER — Telehealth: Payer: Self-pay | Admitting: Family Medicine

## 2016-11-04 ENCOUNTER — Encounter: Payer: Self-pay | Admitting: Family Medicine

## 2016-11-04 MED ORDER — DIAZEPAM 5 MG PO TABS: 5.0000 mg | ORAL_TABLET | Freq: Once | ORAL | 0 refills | Status: DC | PRN

## 2016-11-04 NOTE — Telephone Encounter (Deleted)
Did we have a chance to contact pt to schedule appts? thanks

## 2016-11-04 NOTE — Telephone Encounter (Signed)
Rx called in to requested pharmacy. Lm on pts vm and advised per Dr Darnell Level

## 2016-11-04 NOTE — Telephone Encounter (Signed)
Patient is claustrophobic and is requesting some medicine be called in to his pharmacy for the day of his MRI. Please call in meds for him .

## 2016-11-04 NOTE — Telephone Encounter (Signed)
plz phone in valium 5mg  - someone should drive him there and back.

## 2016-11-05 ENCOUNTER — Ambulatory Visit (INDEPENDENT_AMBULATORY_CARE_PROVIDER_SITE_OTHER): Payer: BLUE CROSS/BLUE SHIELD | Admitting: Internal Medicine

## 2016-11-05 ENCOUNTER — Ambulatory Visit: Payer: BLUE CROSS/BLUE SHIELD | Admitting: Family Medicine

## 2016-11-05 ENCOUNTER — Encounter: Payer: Self-pay | Admitting: Internal Medicine

## 2016-11-05 ENCOUNTER — Ambulatory Visit (INDEPENDENT_AMBULATORY_CARE_PROVIDER_SITE_OTHER): Payer: BLUE CROSS/BLUE SHIELD

## 2016-11-05 VITALS — BP 126/72 | HR 76 | Ht 67.0 in | Wt 193.5 lb

## 2016-11-05 DIAGNOSIS — R0789 Other chest pain: Secondary | ICD-10-CM

## 2016-11-05 DIAGNOSIS — R42 Dizziness and giddiness: Secondary | ICD-10-CM

## 2016-11-05 DIAGNOSIS — R002 Palpitations: Secondary | ICD-10-CM | POA: Diagnosis not present

## 2016-11-05 MED ORDER — FAMOTIDINE 20 MG PO TABS: 20.0000 mg | ORAL_TABLET | Freq: Two times a day (BID) | ORAL | Status: DC

## 2016-11-05 NOTE — Patient Instructions (Addendum)
Medication Instructions:  Your physician has recommended you make the following change in your medication:  1- START Famotidine 20 mg by mouth two times a day. You can get this over-the-counter.   Labwork: none  Testing/Procedures: Your physician has recommended that you wear an 14 DAY ZIO event monitor. Event monitors are medical devices that record the heart's electrical activity. Doctors most often [place] these monitors to diagnose arrhythmias. Arrhythmias are problems with the speed or rhythm of the heartbeat. The monitor is a small, portable device. You can wear one while you do your normal daily activities. This is usually used to diagnose what is causing palpitations/syncope (passing out). A Zio Patch Event Heart monitor will be applied to your chest today.  You will wear the patch for 14 days. After 24 hours, you may shower with the heart monitor on. If you feel any SYMPTOMS, you may press and release the button in the middle of the monitor. - REMOVE ON 11/19/16.    Your physician has requested that you have en exercise stress myoview. For further information please visit [email or web address]. Please follow instruction sheet, as given.    Blake Manning  Your caregiver has ordered a Stress Test with nuclear imaging. The purpose of this test is to evaluate the blood supply to your heart muscle. This procedure is referred to as a "Non-Invasive Stress Test." This is because other than having an IV started in your vein, nothing is inserted or "invades" your body. Cardiac stress tests are done to find areas of poor blood flow to the heart by determining the extent of coronary artery disease (CAD). Some patients exercise on a treadmill, which naturally increases the blood flow to your heart, while others who are  unable to walk on a treadmill due to physical limitations have a pharmacologic/chemical stress agent called Lexiscan . This medicine will mimic walking on a treadmill by  temporarily increasing your coronary blood flow.   Please note: these test may take anywhere between 2-4 hours to complete  PLEASE REPORT TO [facility] AT THE FIRST DESK WILL DIRECT YOU WHERE TO GO  Date of Procedure:_____05/16/18_________  Arrival Time for Procedure:______07:15 AM____________  Instructions regarding medication:   _X_ : Hold diabetes medication (JANUMET ) THE morning of procedure    PLEASE NOTIFY THE OFFICE AT LEAST 24 HOURS IN ADVANCE IF YOU ARE UNABLE TO KEEP YOUR APPOINTMENT.  [phone] AND  PLEASE NOTIFY NUCLEAR MEDICINE AT [facility] AT LEAST 24 HOURS IN ADVANCE IF YOU ARE UNABLE TO KEEP YOUR APPOINTMENT. [phone]  How to prepare for your Myoview test:  1. Do not eat or drink after midnight 2. No caffeine for 24 hours prior to test 3. No smoking 24 hours prior to test. 4. Your medication may be taken with water.  If your doctor stopped a medication because of this test, do not take that medication. 5. Ladies, please do not wear dresses.  Skirts or pants are appropriate. Please wear a short sleeve shirt. 6. No perfume, cologne or lotion. 7. Wear comfortable walking shoes. No heels!    Follow-Up: Your physician recommends that you schedule a follow-up appointment in: [place].   Cardiac Nuclear Scan A cardiac nuclear scan is a test that measures blood flow to the heart when a person is resting and when he or she is exercising. The test looks for problems such as:  Not enough blood reaching a portion of the heart.  The heart  muscle not working normally. You may need this test if:  You have heart disease.  You have had abnormal lab results.  You have had heart surgery or angioplasty.  You have chest pain.  You have shortness of breath. In this test, a radioactive dye (tracer) is injected into your bloodstream. After the tracer has traveled to your heart, an imaging device is used to measure how much of  the tracer is absorbed by or distributed to various areas of your heart. This procedure is usually done at a hospital and takes 2-4 hours. Tell a health care provider about:  Any allergies you have.  All medicines you are taking, including vitamins, herbs, eye drops, creams, and over-the-counter medicines.  Any problems you or family members have had with the use of anesthetic medicines.  Any blood disorders you have.  Any surgeries you have had.  Any medical conditions you have.  Whether you are pregnant or may be pregnant. What are the risks? Generally, this is a safe procedure. However, problems may occur, including:  Serious chest pain and heart attack. This is only a risk if the stress portion of the test is done.  Rapid heartbeat.  Sensation of warmth in your chest. This usually passes quickly. What happens before the procedure?  Ask your health care provider about changing or stopping your regular medicines. This is especially important if you are taking diabetes medicines or blood thinners.  Remove your jewelry on the day of the procedure. What happens during the procedure?  An IV tube will be inserted into one of your veins.  Your health care provider will inject a small amount of radioactive tracer through the tube.  You will wait for 20-40 minutes while the tracer travels through your bloodstream.  Your heart activity will be monitored with an electrocardiogram (ECG).  You will lie down on an exam table.  Images of your heart will be taken for about 15-20 minutes.  You may be asked to exercise on a treadmill or stationary bike. While you exercise, your heart's activity will be monitored with an ECG, and your blood pressure will be checked. If you are unable to exercise, you may be given a medicine to increase blood flow to parts of your heart.  When blood flow to your heart has peaked, a tracer will again be injected through the IV tube.  After 20-40 minutes,  you will get back on the exam table and have more images taken of your heart.  When the procedure is over, your IV tube will be removed. The procedure may vary among health care providers and hospitals. Depending on the type of tracer used, scans may need to be repeated 3-4 hours later. What happens after the procedure?  Unless your health care provider tells you otherwise, you may return to your normal schedule, including diet, activities, and medicines.  Unless your health care provider tells you otherwise, you may increase your fluid intake. This will help flush the contrast dye from your body. Drink enough fluid to keep your urine clear or pale yellow.  It is up to you to get your test results. Ask your health care provider, or the department that is doing the test, when your results will be ready. Summary  A cardiac nuclear scan measures the blood flow to the heart when a person is resting and when he or she is exercising.  You may need this test if you are at risk for heart disease.  Tell your  health care provider if you are pregnant.  Unless your health care provider tells you otherwise, increase your fluid intake. This will help flush the contrast dye from your body. Drink enough fluid to keep your urine clear or pale yellow. This information is not intended to replace advice given to you by your health care provider. Make sure you discuss any questions you have with your health care provider. Document Released: 07/19/2004 Document Revised: 06/26/2016 Document Reviewed: 06/02/2013 Elsevier Interactive Patient Education  2017 [employer].

## 2016-11-05 NOTE — Progress Notes (Signed)
New Outpatient Visit Date: 11/05/2016  Referring Provider: Ria Bush, MD [place], [place] [address]  Chief Complaint: Palpitations and dizziness  HPI:  Blake Manning is a 61 y.o. male who is being seen today for the evaluation of palpitations and dizziness at the request of Dr. Danise Mina. He has a history of hyperlipidemia, type 2 diabetes mellitus, renal cancer status post right partial nephrectomy, nephrolithiasis, hepatic steatosis, and degenerative disk disease. 6 days ago, he had an episode of nausea with accompanying diaphoresis, lightheadedness, and headache radiating down to the neck that occurred while in [place] for work. He placed his head on the desk in front of him and remained there for several hours before ultimately being able to get up. He also felt intermittent palpitations, with sensation that his heart was racing, during the episode. He was ultimately convinced by a coworker to seek medical attention at [facility] Evaluation there was unrevealing. He notes a similar but less severe episode that had occurred a week prior. He did not check his blood sugar during either episode, though he notes that his glucose was normal in the emergency department. He has continued to experience intermittent palpitations albeit without the other accompanying symptoms. He denies chest pain other then the uncomfortable feeling associated with the palpitations. He also reports occasional vague discomfort radiating from the upper abdomen to the base of his neck. He has not had any shortness of breath, edema, orthopnea, PND, or claudication. The patient's wife reports that Blake Manning snores at night, though she has never witnessed any apneic episodes. Blake Manning has never passed out.  Blake Manning denies a history of previous cardiovascular disease. He has never undergone cardiac evaluation, including echocardiography and stress testing. He drinks 3-4 cups of  caffeinated coffee per day. He is quite active, exercising regularly without any exertional symptoms.  --------------------------------------------------------------------------------------------------  Cardiovascular History & Procedures: Cardiovascular Problems:  Palpitations and lightheadedness  Atypical chest pain  Risk Factors:  Hyperlipidemia, diabetes mellitus, and male gender  Cath/PCI:  None  CV Surgery:  None  EP Procedures and Devices:  None  Non-Invasive Evaluation(s):  None  Recent CV Pertinent Labs: Lab Results  Component Value Date   CHOL 188 06/13/2016   HDL 50.40 06/13/2016   LDLCALC 110 (H) 06/06/2015   LDLDIRECT 109 11/01/2016   TRIG 205.0 (H) 06/13/2016   CHOLHDL 4 06/13/2016   K 4.1 06/13/2016   K 3.5 01/20/2014   BUN 17 06/13/2016   BUN 16 01/20/2014   CREATININE 0.97 06/13/2016   CREATININE 1.10 01/20/2014    --------------------------------------------------------------------------------------------------  Past Medical History:  Diagnosis Date  . DDD (degenerative disc disease), lumbar 01/2014   severe by CT scan at L4/5 and mod at L3/4 and L5/S1, severe R L4/5 and L L5/S1 neural foraminal narrowing  . Diabetes type 2, controlled ([place]) 2010   referred for DSME (05/2013) but pt did not return phone call  . Fatty liver 01/2014   by CT scan  . History of kidney stones    uric acid  . Hyperlipidemia   . Renal cancer ([place]) 2009   part of R kidney removed (Dr. 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 KNEE SURGERY Left 1980s   ?meniscus  . PARTIAL NEPHRECTOMY Right 2009   RCC, negative margins    Outpatient Encounter Prescriptions as  of 11/05/2016  Medication Sig  . aspirin (ASPIRIN EC) 81 MG EC tablet Take 1 tablet (81 mg total) by mouth daily. Swallow whole.  . Blood Glucose  Monitoring Suppl (BLOOD GLUCOSE METER) kit Accu-Chek Comfort Curve Dx: 250.00  Use to check sugar once daily  . diazepam (VALIUM) 5 MG tablet Take 1-2 tablets (5-10 mg total) by mouth once as needed for anxiety.  . gabapentin (NEURONTIN) 100 MG capsule take 2 capsules by mouth every evening at bedtime  . glucose blood (ACCU-CHEK AVIVA PLUS) test strip Use to check sugar once daily. Dx: E11.40; E11.65  . JANUMET 50-1000 MG tablet take 1 tablet by mouth twice a day with meals  . Potassium Citrate 15 MEQ (1620 MG) TBCR   . Red Yeast Rice 600 MG CAPS Take 1 capsule (600 mg total) by mouth 2 (two) times daily.   No facility-administered encounter medications on file as of 11/05/2016.     Allergies: Glipizide; Lipitor [atorvastatin]; Lyrica [pregabalin]; Naprosyn [naproxen]; Pravastatin; and Codeine  Social History   Social History  . Marital status: Married    Spouse name: N/A  . Number of children: N/A  . Years of education: N/A   Occupational History  . Not on file.   Social History Main Topics  . Smoking status: Never Smoker  . Smokeless tobacco: Never Used  . Alcohol use No  . Drug use: No  . Sexual activity: Not on file   Other Topics Concern  . Not on file   Social History Narrative   Lives with wife, 1 dog   Grown children   Occupation: Armed forces technical officer   Edu: HS, going to join local gym   Diet: good water, fruits/vegetables daily    Family History  Problem Relation Age of Onset  . Cancer Mother     lung (smoker)  . Diabetes Mother   . Cancer Maternal Aunt     lung (smoker)  . Cancer Maternal Aunt     lung with met to brain (smoker)  . Valvular heart disease Son 11    extra heart valves s/p recent procedure  . Stroke Neg Hx   . Hypertension Son   . Hyperlipidemia Son     Review of Systems: A 12-system review of systems was performed and was negative except as noted in the  HPI.  --------------------------------------------------------------------------------------------------  Physical Exam: BP 126/72 (BP Location: Right Arm, Patient Position: Sitting, Cuff Size: Normal)   Pulse 76   Ht '5\' 7"'$  (1.702 m)   Wt 193 lb 8 oz (87.8 kg)   BMI 30.31 kg/m  Position Blood pressure (mmHg) Heart rate (bpm)  Lying 128/72  73   Sitting 130/72  73   Standing 126/72  79   Standing (3 minutes) 124/72  80    General:  Overweight man, seated comfortably in the exam room. He is accompanied by his wife. HEENT: No conjunctival pallor or scleral icterus.  Moist mucous membranes.  OP clear. Neck: Supple without lymphadenopathy, thyromegaly, JVD, or HJR.  No carotid bruit. Lungs: Normal work of breathing.  Clear to auscultation bilaterally without wheezes or crackles. Heart: Regular rate and rhythm without murmurs, rubs, or gallops.  Non-displaced PMI. Abd: Bowel sounds present.  Soft, NT/ND without hepatosplenomegaly Ext: No lower extremity edema.  Radial, PT, and DP pulses are 2+ bilaterally Skin: warm and dry without rash Neuro: CNIII-XII intact.  Strength and fine-touch sensation intact in upper and lower extremities bilaterally. Psych: Normal mood and affect.  EKG:  Normal sinus rhythm without abnormalities.  Lab Results  Component Value Date   WBC 7.7 06/13/2016   HGB 11.8 (L) 06/13/2016   HCT 36.6 (L) 06/13/2016   MCV 70.3 (L) 06/13/2016   PLT 228.0 06/13/2016    Lab Results  Component Value Date   NA 137 06/13/2016   K 4.1 06/13/2016   CL 101 06/13/2016   CO2 28 06/13/2016   BUN 17 06/13/2016   CREATININE 0.97 06/13/2016   GLUCOSE 179 (H) 06/13/2016   ALT 38 06/13/2016    Lab Results  Component Value Date   CHOL 188 06/13/2016   HDL 50.40 06/13/2016   LDLCALC 110 (H) 06/06/2015   LDLDIRECT 109 11/01/2016   TRIG 205.0 (H) 06/13/2016   CHOLHDL 4 06/13/2016     --------------------------------------------------------------------------------------------------  ASSESSMENT AND PLAN: Palpitations and lightheadedness Blake Manning has experienced 2 episodes of significant nausea, diaphoresis, and lightheadedness in the setting of palpitations over the last 2 weeks. There are no clear precipitant. Primary GI pathology is a consideration, given the nausea was the initial symptom. Palpitations may reflect a stress response to significant GI distress. Nonetheless, given that he has had isolated palpitations at other times over the last week, we have agreed to obtain a 14-day event monitor for further characterization potential arrhythmias. His exam is unremarkable today, without murmurs or other findings to suggest heart failure or significant valvular disease. We will therefore defer transthoracic echo, pending myocardial perfusion stress test as detailed below  Atypical chest pain The patient describes an uncomfortable sensation in the chest associated with his heart flutters as well as occasional discomfort radiating up into the neck. Given his recent nausea, it is possible he may have underlying GERD. I recommended a trial of famotidine 20 mg twice a day. He has several cardiovascular risk factors, including diabetes mellitus, male gender, hyperlipidemia, and age greater than 31. We have agreed to obtain an exercise myocardial perfusion stress test for further evaluation. We will defer transthoracic echocardiogram, as the MPI will allow [place] to assess his LV function.  Follow-up: Return to clinic in 1 month.  Nelva Bush, MD 11/05/2016 8:22 PM

## 2016-11-08 ENCOUNTER — Encounter: Payer: Self-pay | Admitting: Internal Medicine

## 2016-11-14 DIAGNOSIS — R42 Dizziness and giddiness: Secondary | ICD-10-CM | POA: Diagnosis not present

## 2016-11-14 DIAGNOSIS — R0789 Other chest pain: Secondary | ICD-10-CM

## 2016-11-14 DIAGNOSIS — R002 Palpitations: Secondary | ICD-10-CM

## 2016-11-18 ENCOUNTER — Ambulatory Visit
Admission: RE | Admit: 2016-11-18 | Discharge: 2016-11-18 | Disposition: A | Discharge status: Home / Self Care | Payer: BLUE CROSS/BLUE SHIELD | Source: Ambulatory Visit | Attending: Family Medicine | Admitting: Family Medicine

## 2016-11-18 ENCOUNTER — Other Ambulatory Visit: Payer: Self-pay | Admitting: Family Medicine

## 2016-11-18 DIAGNOSIS — Z85528 Personal history of other malignant neoplasm of kidney: Secondary | ICD-10-CM

## 2016-11-18 DIAGNOSIS — E1165 Type 2 diabetes mellitus with hyperglycemia: Principal | ICD-10-CM

## 2016-11-18 DIAGNOSIS — IMO0002 Reserved for concepts with insufficient information to code with codable children: Secondary | ICD-10-CM

## 2016-11-18 DIAGNOSIS — R42 Dizziness and giddiness: Secondary | ICD-10-CM

## 2016-11-18 DIAGNOSIS — E1140 Type 2 diabetes mellitus with diabetic neuropathy, unspecified: Secondary | ICD-10-CM

## 2016-11-18 DIAGNOSIS — E785 Hyperlipidemia, unspecified: Secondary | ICD-10-CM

## 2016-11-19 ENCOUNTER — Telehealth: Payer: Self-pay | Admitting: *Deleted

## 2016-11-19 NOTE — Telephone Encounter (Signed)
Received incoming call from patient. He confirmed that he will be at [facility] tomorrow morning and understanding of instructions.

## 2016-11-19 NOTE — Telephone Encounter (Signed)
No answer.  Ok to leave detailed message. Message with Exercise Myoview date and arrival time and pre-procedural instructions left and to call back if he has any questions or cannot make appt.

## 2016-11-20 ENCOUNTER — Encounter
Admission: RE | Admit: 2016-11-20 | Discharge: 2016-11-20 | Disposition: A | Discharge status: Home / Self Care | Payer: BLUE CROSS/BLUE SHIELD | Source: Ambulatory Visit | Attending: Internal Medicine | Admitting: Internal Medicine

## 2016-11-20 DIAGNOSIS — F40240 Claustrophobia: Secondary | ICD-10-CM | POA: Insufficient documentation

## 2016-11-20 DIAGNOSIS — R002 Palpitations: Secondary | ICD-10-CM | POA: Diagnosis not present

## 2016-11-20 DIAGNOSIS — R42 Dizziness and giddiness: Secondary | ICD-10-CM | POA: Diagnosis not present

## 2016-11-20 DIAGNOSIS — R0789 Other chest pain: Secondary | ICD-10-CM | POA: Diagnosis not present

## 2016-11-20 MED ORDER — TECHNETIUM TC 99M TETROFOSMIN IV KIT
13.5000 | PACK | Freq: Once | INTRAVENOUS | Status: AC | PRN
  Administered 2016-11-20: 13.5 via INTRAVENOUS

## 2016-11-21 ENCOUNTER — Other Ambulatory Visit: Payer: Self-pay | Admitting: Internal Medicine

## 2016-11-22 ENCOUNTER — Telehealth: Payer: Self-pay | Admitting: *Deleted

## 2016-11-22 ENCOUNTER — Telehealth: Payer: Self-pay | Admitting: Internal Medicine

## 2016-11-22 DIAGNOSIS — F40240 Claustrophobia: Secondary | ICD-10-CM

## 2016-11-22 NOTE — Telephone Encounter (Signed)
Pt unable to complete the ordered 5/16 myoview due to claustrophobia issues.  Returned zio patch after wearing it only 11 of the 14 days. Results not yet available. He has a June 6 f/u appt with Dr. Saunders Revel. Forward to MD to determine if other testing should be ordered in place of the myoview.

## 2016-11-22 NOTE — Telephone Encounter (Signed)
Patient was unable to complete stress test due to his anxiety regarding his claustrophobia. Also, the heart monitor that he was supposed to wear came off while he was working in the yard and sweating. The monitor was supposed to stay on until the 15th, but it came off on the 11th and the patient mailed it back in. Patient and spouse are unsure of what the provider wants them to do now or which direction they want them to go. Please advise.

## 2016-11-22 NOTE — Telephone Encounter (Signed)
Dr Danise Mina will you please cancel your MRI and MRA orders.

## 2016-11-22 NOTE — Telephone Encounter (Signed)
Spoke to pts wife who states pt is refusing to have MRI. He is claustrophobic and is unable to have it completed. If possible, she is requesting his refusal be added to his chart so that other providers are made aware, to not order them in the future

## 2016-11-23 DIAGNOSIS — F40240 Claustrophobia: Secondary | ICD-10-CM | POA: Insufficient documentation

## 2016-11-23 NOTE — Telephone Encounter (Signed)
Noted.  Would offer neurology referral for further evaluation. Would offer CT scan in place of MRI - as CT could possibly be done in open machine, and would be shorter amt time than MRI - less claustrophobia symptoms with this. Less ideal study, but would also give [place] information. Did he take valium - how did he fell with this medicine? (? Anxiety component to symptoms)

## 2016-11-25 ENCOUNTER — Other Ambulatory Visit: Payer: Self-pay | Admitting: *Deleted

## 2016-11-25 DIAGNOSIS — R002 Palpitations: Secondary | ICD-10-CM

## 2016-11-25 DIAGNOSIS — R42 Dizziness and giddiness: Secondary | ICD-10-CM

## 2016-11-25 NOTE — Telephone Encounter (Signed)
Spoke to pt. He said he did not take the valium. He said he was ok until they placed a mask on his face. He said he is having a stress test ans wants to wait and see what happens from that.

## 2016-11-28 DIAGNOSIS — J029 Acute pharyngitis, unspecified: Secondary | ICD-10-CM | POA: Diagnosis not present

## 2016-12-05 ENCOUNTER — Telehealth: Payer: Self-pay | Admitting: *Deleted

## 2016-12-05 NOTE — Telephone Encounter (Signed)
Patient cancelled his upcoming appt's and further testing. Noted in appt note that patient said, " Cancel Rsn: Patient (pt calling stating he is not "going this way, we can cancel all orders" )   Left message asking patient to call back if he had any further questions concerning testing or plan of care.

## 2016-12-11 ENCOUNTER — Ambulatory Visit: Payer: BLUE CROSS/BLUE SHIELD | Admitting: Internal Medicine

## 2016-12-18 ENCOUNTER — Other Ambulatory Visit: Payer: Self-pay | Admitting: Family Medicine

## 2016-12-18 NOTE — Telephone Encounter (Signed)
Received refill electronically Last refill 06/12/16 #180/1 Last office visit 11/01/16

## 2017-05-08 ENCOUNTER — Other Ambulatory Visit: Payer: Self-pay | Admitting: Family Medicine

## 2017-06-09 DIAGNOSIS — Z85528 Personal history of other malignant neoplasm of kidney: Secondary | ICD-10-CM | POA: Diagnosis not present

## 2017-06-09 DIAGNOSIS — N401 Enlarged prostate with lower urinary tract symptoms: Secondary | ICD-10-CM | POA: Diagnosis not present

## 2017-06-09 DIAGNOSIS — R351 Nocturia: Secondary | ICD-10-CM | POA: Diagnosis not present

## 2017-06-09 DIAGNOSIS — E290 Testicular hyperfunction: Secondary | ICD-10-CM | POA: Diagnosis not present

## 2017-06-09 DIAGNOSIS — N200 Calculus of kidney: Secondary | ICD-10-CM | POA: Diagnosis not present

## 2017-06-09 DIAGNOSIS — E291 Testicular hypofunction: Secondary | ICD-10-CM | POA: Diagnosis not present

## 2017-06-09 DIAGNOSIS — N5201 Erectile dysfunction due to arterial insufficiency: Secondary | ICD-10-CM | POA: Diagnosis not present

## 2017-06-16 ENCOUNTER — Encounter: Payer: BLUE CROSS/BLUE SHIELD | Admitting: Family Medicine

## 2017-06-19 ENCOUNTER — Encounter: Payer: Self-pay | Admitting: Family Medicine

## 2017-06-19 ENCOUNTER — Ambulatory Visit (INDEPENDENT_AMBULATORY_CARE_PROVIDER_SITE_OTHER): Payer: BLUE CROSS/BLUE SHIELD | Admitting: Family Medicine

## 2017-06-19 VITALS — BP 134/82 | HR 62 | Temp 98.2°F | Ht 65.5 in | Wt 194.2 lb

## 2017-06-19 DIAGNOSIS — Z87442 Personal history of urinary calculi: Secondary | ICD-10-CM

## 2017-06-19 DIAGNOSIS — E1165 Type 2 diabetes mellitus with hyperglycemia: Secondary | ICD-10-CM

## 2017-06-19 DIAGNOSIS — E669 Obesity, unspecified: Secondary | ICD-10-CM

## 2017-06-19 DIAGNOSIS — E785 Hyperlipidemia, unspecified: Secondary | ICD-10-CM

## 2017-06-19 DIAGNOSIS — E66811 Obesity, class 1: Secondary | ICD-10-CM

## 2017-06-19 DIAGNOSIS — E1140 Type 2 diabetes mellitus with diabetic neuropathy, unspecified: Secondary | ICD-10-CM | POA: Diagnosis not present

## 2017-06-19 DIAGNOSIS — Z0000 Encounter for general adult medical examination without abnormal findings: Secondary | ICD-10-CM | POA: Diagnosis not present

## 2017-06-19 DIAGNOSIS — IMO0002 Reserved for concepts with insufficient information to code with codable children: Secondary | ICD-10-CM

## 2017-06-19 DIAGNOSIS — R202 Paresthesia of skin: Secondary | ICD-10-CM

## 2017-06-19 LAB — MICROALBUMIN / CREATININE URINE RATIO
Creatinine,U: 70.7 mg/dL
Microalb Creat Ratio: 3.4 mg/g (ref 0.0–30.0)
Microalb, Ur: 2.4 mg/dL — ABNORMAL HIGH (ref 0.0–1.9)

## 2017-06-19 LAB — CBC WITH DIFFERENTIAL/PLATELET
Basophils Absolute: 0.1 10*3/uL (ref 0.0–0.1)
Basophils Relative: 0.8 % (ref 0.0–3.0)
Eosinophils Absolute: 0.1 10*3/uL (ref 0.0–0.7)
Eosinophils Relative: 1.8 % (ref 0.0–5.0)
HCT: 40.5 % (ref 39.0–52.0)
Hemoglobin: 12.7 g/dL — ABNORMAL LOW (ref 13.0–17.0)
Lymphocytes Relative: 34.7 % (ref 12.0–46.0)
Lymphs Abs: 2.5 10*3/uL (ref 0.7–4.0)
MCHC: 31.4 g/dL (ref 30.0–36.0)
MCV: 71.8 fl — ABNORMAL LOW (ref 78.0–100.0)
Monocytes Absolute: 0.6 10*3/uL (ref 0.1–1.0)
Monocytes Relative: 7.8 % (ref 3.0–12.0)
Neutro Abs: 4 10*3/uL (ref 1.4–7.7)
Neutrophils Relative %: 54.9 % (ref 43.0–77.0)
Platelets: 273 10*3/uL (ref 150.0–400.0)
RBC: 5.64 Mil/uL (ref 4.22–5.81)
RDW: 14.9 % (ref 11.5–15.5)
WBC: 7.3 10*3/uL (ref 4.0–10.5)

## 2017-06-19 LAB — LIPID PANEL
Cholesterol: 173 mg/dL (ref 0–200)
HDL: 56.4 mg/dL (ref >39.00)
LDL Cholesterol: 93 mg/dL (ref 0–99)
NonHDL: 116.13
Total CHOL/HDL Ratio: 3
Triglycerides: 117 mg/dL (ref 0.0–149.0)
VLDL: 23.4 mg/dL (ref 0.0–40.0)

## 2017-06-19 LAB — BASIC METABOLIC PANEL
BUN: 10 mg/dL (ref 6–23)
CO2: 29 mEq/L (ref 19–32)
Calcium: 10.1 mg/dL (ref 8.4–10.5)
Chloride: 99 mEq/L (ref 96–112)
Creatinine, Ser: 0.89 mg/dL (ref 0.40–1.50)
GFR: 92.93 mL/min (ref >60.00)
Glucose, Bld: 143 mg/dL — ABNORMAL HIGH (ref 70–99)
Potassium: 4.1 mEq/L (ref 3.5–5.1)
Sodium: 135 mEq/L (ref 135–145)

## 2017-06-19 LAB — VITAMIN B12: Vitamin B-12: 367 pg/mL (ref 211–911)

## 2017-06-19 LAB — HEMOGLOBIN A1C: Hgb A1c MFr Bld: 8.2 % — ABNORMAL HIGH (ref 4.6–6.5)

## 2017-06-19 NOTE — Assessment & Plan Note (Signed)
Preventative protocols reviewed and updated unless pt declined. Discussed healthy diet and lifestyle.  

## 2017-06-19 NOTE — Assessment & Plan Note (Signed)
Intolerant to statins. Continue RYR 1 cap BID. Update FLP.

## 2017-06-19 NOTE — Assessment & Plan Note (Signed)
Continue potassium citrate preventatively

## 2017-06-19 NOTE — Assessment & Plan Note (Addendum)
Chronic, reports compliance with janumet bid - continue and update labs today. Foot exam today. I asked him to return in 6 mo for DM f/u visit. He states he does not need medication refills at this time.

## 2017-06-19 NOTE — Assessment & Plan Note (Addendum)
Continue potassium citrate and allopurinol preventatively

## 2017-06-19 NOTE — Assessment & Plan Note (Signed)
Weight remains stable.

## 2017-06-19 NOTE — Patient Instructions (Addendum)
Sign release again for records of latest colonoscopy in [place].  Labs today.  Don't walk barefoot indoors or outdoors.  You are doing well today. Continue current medicines.  Return as needed or in 6 months for diabetes follow up visit.  Health Maintenance, Male A healthy lifestyle and preventive care is important for your health and wellness. Ask your health care provider about what schedule of regular examinations is right for you. What should I know about weight and diet? Eat a Healthy Diet  Eat plenty of vegetables, fruits, whole grains, low-fat dairy products, and lean protein.  Do not eat a lot of foods high in solid fats, added sugars, or salt.  Maintain a Healthy Weight Regular exercise can help you achieve or maintain a healthy weight. You should:  Do at least 150 minutes of exercise each week. The exercise should increase your heart rate and make you sweat (moderate-intensity exercise).  Do strength-training exercises at least twice a week.  Watch Your Levels of Cholesterol and Blood Lipids  Have your blood tested for lipids and cholesterol every 5 years starting at 59 years of age. If you are at high risk for heart disease, you should start having your blood tested when you are 59 years old. You may need to have your cholesterol levels checked more often if: ? Your lipid or cholesterol levels are high. ? You are older than 59 years of age. ? You are at high risk for heart disease.  What should I know about cancer screening? Many types of cancers can be detected early and may often be prevented. Lung Cancer  You should be screened every year for lung cancer if: ? You are a current smoker who has smoked for at least 30 years. ? You are a former smoker who has quit within the past 15 years.  Talk to your health care provider about your screening options, when you should start screening, and how often you should be screened.  Colorectal Cancer  Routine  colorectal cancer screening usually begins at 59 years of age and should be repeated every 5-10 years until you are 59 years old. You may need to be screened more often if early forms of precancerous polyps or small growths are found. Your health care provider may recommend screening at an earlier age if you have risk factors for colon cancer.  Your health care provider may recommend using home test kits to check for hidden blood in the stool.  A small camera at the end of a tube can be used to examine your colon (sigmoidoscopy or colonoscopy). This checks for the earliest forms of colorectal cancer.  Prostate and Testicular Cancer  Depending on your age and overall health, your health care provider may do certain tests to screen for prostate and testicular cancer.  Talk to your health care provider about any symptoms or concerns you have about testicular or prostate cancer.  Skin Cancer  Check your skin from head to toe regularly.  Tell your health care provider about any new moles or changes in moles, especially if: ? There is a change in a mole's size, shape, or color. ? You have a mole that is larger than a pencil eraser.  Always use sunscreen. Apply sunscreen liberally and repeat throughout the day.  Protect yourself by wearing long sleeves, pants, a wide-brimmed hat, and sunglasses when outside.  What should I know about heart disease, diabetes, and high blood pressure?  If you are 18-39 years of  age, have your blood pressure checked every 3-5 years. If you are 29 years of age or older, have your blood pressure checked every year. You should have your blood pressure measured twice-once when you are at a hospital or clinic, and once when you are not at a hospital or clinic. Record the average of the two measurements. To check your blood pressure when you are not at a hospital or clinic, you can use: ? An automated blood pressure machine at a pharmacy. ? A home blood pressure  monitor.  Talk to your health care provider about your target blood pressure.  If you are between 7-67 years old, ask your health care provider if you should take aspirin to prevent heart disease.  Have regular diabetes screenings by checking your fasting blood sugar level. ? If you are at a normal weight and have a low risk for diabetes, have this test once every three years after the age of 40. ? If you are overweight and have a high risk for diabetes, consider being tested at a younger age or more often.  A one-time screening for abdominal aortic aneurysm (AAA) by ultrasound is recommended for men aged 86-75 years who are current or former smokers. What should I know about preventing infection? Hepatitis B If you have a higher risk for hepatitis B, you should be screened for this virus. Talk with your health care provider to find out if you are at risk for hepatitis B infection. Hepatitis C Blood testing is recommended for:  Everyone born from [birth year] through 1963.  Anyone with known risk factors for hepatitis C.  Sexually Transmitted Diseases (STDs)  You should be screened each year for STDs including gonorrhea and chlamydia if: ? You are sexually active and are younger than 59 years of age. ? You are older than 59 years of age and your health care provider tells you that you are at risk for this type of infection. ? Your sexual activity has changed since you were last screened and you are at an increased risk for chlamydia or gonorrhea. Ask your health care provider if you are at risk.  Talk with your health care provider about whether you are at high risk of being infected with HIV. Your health care provider may recommend a prescription medicine to help prevent HIV infection.  What else can I do?  Schedule regular health, dental, and eye exams.  Stay current with your vaccines (immunizations).  Do not use any tobacco products, such as cigarettes, chewing tobacco, and  e-cigarettes. If you need help quitting, ask your health care provider.  Limit alcohol intake to no more than 2 drinks per day. One drink equals 12 ounces of beer, 5 ounces of wine, or 1 ounces of hard liquor.  Do not use street drugs.  Do not share needles.  Ask your health care provider for help if you need support or information about quitting drugs.  Tell your health care provider if you often feel depressed.  Tell your health care provider if you have ever been abused or do not feel safe at home. This information is not intended to replace advice given to you by your health care provider. Make sure you discuss any questions you have with your health care provider. Document Released: 12/21/2007 Document Revised: 02/21/2016 Document Reviewed: 03/28/2015 Elsevier Interactive Patient Education  [employer].

## 2017-06-19 NOTE — Progress Notes (Signed)
BP 134/82 (BP Location: Left Arm, Patient Position: Sitting, Cuff Size: Normal)   Pulse 62   Temp 98.2 F (36.8 C) (Oral)   Ht 5' 5.5" (1.664 m)   Wt 194 lb 4 oz (88.1 kg)   SpO2 96%   BMI 31.83 kg/m    CC: CPE Subjective:    Patient ID: Blake Manning, male    DOB: [birth year], 61 y.o.   MRN: [id number]  HPI: Blake Manning is a 61 y.o. male presenting on 06/19/2017 for Annual Exam   H/o clear cell RCC followed by urology Dr Eliberto Ivory.   Known T2DM with neuropathy. Stopped glimepiride but reports ongoing compliance janumet. Hasn't checked sugars recently at home - doesn't know what glucometer brand he uses. Eye appt scheduled later this month.   Episode of dizziness with palpitations while at [facility] 10/2016 - unable to complete syncope workup with myoview and MRI due to severe claustrophobia. Denies ongoing concerns.   Preventative: Colonoscopy 2012@ [place] - never received records - requested again today.  Prostate cancer screening - followed by urologist Eliberto Ivory) who he sees yearly for h/o kidney cancer. Mild enlargement.  Flu shot - today Tetanus shot - 2011.  Pneumovax - 2015.  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 Interim medical history since our last visit reviewed. Allergies and medications reviewed and updated. Outpatient Medications Prior to Visit  Medication Sig Dispense Refill  . aspirin (ASPIRIN EC) 81 MG EC tablet Take 1 tablet (81 mg total) by mouth daily. Swallow whole.    . Blood Glucose Monitoring Suppl (BLOOD GLUCOSE METER) kit Accu-Chek Comfort Curve Dx: 250.00  Use to check sugar once daily 1 each 0  . gabapentin (NEURONTIN) 100 MG capsule  TAKE 2 CAPSULES BY MOUTH AT BEDTIME 180 capsule 0  . glucose blood (ACCU-CHEK AVIVA PLUS) test strip Use to check sugar once daily. Dx: E11.40; E11.65 50 each 3  . JANUMET 50-1000 MG tablet Take 1 tablet by mouth 2 (two) times daily.    . Potassium Citrate 15 MEQ (1620 MG) TBCR 2 (two) times daily.   1  . Red Yeast Rice 600 MG CAPS Take 1 capsule (600 mg total) by mouth 2 (two) times daily.    . famotidine (PEPCID) 20 MG tablet Take 1 tablet (20 mg total) by mouth 2 (two) times daily.    Marland Kitchen JANUMET 50-1000 MG tablet take 1 tablet by mouth twice a day with meals 60 tablet 11   No facility-administered medications prior to visit.      Per HPI unless specifically indicated in ROS section below Review of Systems  Constitutional: Negative for activity change, appetite change, chills, fatigue, fever and unexpected weight change.  HENT: Negative for hearing loss.   Eyes: Negative for visual disturbance.  Respiratory: Negative for cough, chest tightness, shortness of breath and wheezing.   Cardiovascular: Negative for chest pain, palpitations and leg swelling.  Gastrointestinal: Negative for abdominal distention, abdominal pain, blood in stool, constipation, diarrhea, nausea and vomiting.  Genitourinary: Negative for difficulty urinating and hematuria.  Musculoskeletal: Negative for arthralgias, myalgias and neck pain.  Skin: Negative for rash.  Neurological: Negative for dizziness, seizures, syncope and headaches.  Hematological: Negative for adenopathy.  Does not bruise/bleed easily.  Psychiatric/Behavioral: Negative for dysphoric mood. The patient is not nervous/anxious.        Objective:    BP 134/82 (BP Location: Left Arm, Patient Position: Sitting, Cuff Size: Normal)   Pulse 62   Temp 98.2 F (36.8 C) (Oral)   Ht 5' 5.5" (1.664 m)   Wt 194 lb 4 oz (88.1 kg)   SpO2 96%   BMI 31.83 kg/m   Wt Readings from Last 3 Encounters:  06/19/17 194 lb 4 oz (88.1 kg)  11/05/16 193 lb 8 oz  (87.8 kg)  11/01/16 193 lb 4 oz (87.7 kg)    Physical Exam  Constitutional: He is oriented to person, place, and time. He appears well-developed and well-nourished. No distress.  HENT:  Head: Normocephalic and atraumatic.  Right Ear: Hearing, tympanic membrane, external ear and ear canal normal.  Left Ear: Hearing, tympanic membrane, external ear and ear canal normal.  Nose: Nose normal.  Mouth/Throat: Uvula is midline, oropharynx is clear and moist and mucous membranes are normal. No oropharyngeal exudate, posterior oropharyngeal edema or posterior oropharyngeal erythema.  Eyes: Conjunctivae and EOM are normal. Pupils are equal, round, and reactive to light. No scleral icterus.  Neck: Normal range of motion. Neck supple. No thyromegaly present.  Cardiovascular: Normal rate, regular rhythm, normal heart sounds and intact distal pulses.  No murmur heard. Pulses:      Radial pulses are 2+ on the right side, and 2+ on the left side.  Pulmonary/Chest: Effort normal and breath sounds normal. No respiratory distress. He has no wheezes. He has no rales.  Abdominal: Soft. Bowel sounds are normal. He exhibits no distension and no mass. There is no tenderness. There is no rebound and no guarding.  Musculoskeletal: Normal range of motion. He exhibits no edema.  Lymphadenopathy:    He has no cervical adenopathy.  Neurological: He is alert and oriented to person, place, and time.  CN grossly intact, station and gait intact  Skin: Skin is warm and dry. No rash noted.  Psychiatric: He has a normal mood and affect. His behavior is normal. Judgment and thought content normal.  Nursing note and vitals reviewed.  Diabetic Foot Exam - Simple   Simple Foot Form Diabetic Foot exam was performed with the following findings:  Yes 06/19/2017 10:04 AM  Visual Inspection No deformities, no ulcerations, no other skin breakdown bilaterally:  Yes Sensation Testing See comments:  Yes Pulse Check Posterior  Tibialis and Dorsalis pulse intact bilaterally:  Yes Comments Elongated nails Decreased sensation to monofilament testing.     Results for orders placed or performed in visit on 11/01/16  Troponin I  Result Value Ref Range   Troponin I <0.01 <=0.05 ng/mL  Hemoglobin A1c  Result Value Ref Range   Hgb A1c MFr Bld 7.6 (H) <5.7 %   Mean Plasma Glucose 171 mg/dL  LDL Cholesterol, Direct  Result Value Ref Range   Direct LDL 109 <130 mg/dL  TSH  Result Value Ref Range   TSH 1.13 0.40 - 4.50 mIU/L      Assessment & Plan:   Problem List Items Addressed This Visit    Health maintenance examination - Primary    Preventative protocols reviewed and updated unless pt declined. Discussed healthy diet and lifestyle.       History of kidney stones    Continue potassium citrate and allopurinol preventatively      Hyperlipidemia    Intolerant to statins. Continue RYR 1 cap BID.  Update FLP.       Relevant Orders   Lipid panel   Obesity, Class I, BMI 30-34.9    Weight remains stable.       Type 2 diabetes, uncontrolled, with neuropathy (HCC)    Chronic, reports compliance with janumet bid - continue and update labs today. Foot exam today. I asked him to return in 6 mo for DM f/u visit. He states he does not need medication refills at this time.       Relevant Medications   JANUMET 50-1000 MG tablet   Other Relevant Orders   Lipid panel   Hemoglobin Y5W   Basic metabolic panel   Microalbumin / creatinine urine ratio    Other Visit Diagnoses    Paresthesias       Relevant Orders   CBC with Differential/Platelet   Vitamin B12       Follow up plan: Return in about 6 months (around 12/18/2017) for follow up visit.  Ria Bush, MD

## 2017-06-21 ENCOUNTER — Other Ambulatory Visit: Payer: Self-pay | Admitting: Family Medicine

## 2017-06-21 DIAGNOSIS — D509 Iron deficiency anemia, unspecified: Secondary | ICD-10-CM

## 2017-06-21 DIAGNOSIS — D563 Thalassemia minor: Secondary | ICD-10-CM | POA: Insufficient documentation

## 2017-06-21 DIAGNOSIS — E538 Deficiency of other specified B group vitamins: Secondary | ICD-10-CM

## 2017-06-21 MED ORDER — CYANOCOBALAMIN 500 MCG PO TABS: 500.0000 ug | ORAL_TABLET | Freq: Every day | ORAL | Status: DC

## 2017-07-02 ENCOUNTER — Telehealth: Payer: Self-pay | Admitting: Family Medicine

## 2017-07-02 MED ORDER — GABAPENTIN 300 MG PO CAPS: 300.0000 mg | ORAL_CAPSULE | Freq: Every day | ORAL | 1 refills | Status: DC

## 2017-07-02 NOTE — Telephone Encounter (Signed)
plz notify I've sent in 300mg  dose to pharmacy.

## 2017-07-02 NOTE — Telephone Encounter (Signed)
Copied from [place] [id number]. Topic: Quick Communication - Rx Refill/Question >> Jul 02, 2017  4:21 PM Oliver Pila B wrote: Pt called b/c pt did increase the gabapentin to 3 a day and now is out of medicine and the pharmacy will not refill the Rx, pt is wondering what to do now to have another refill, contact pt to advise or contact the pharmacy

## 2017-07-02 NOTE — Telephone Encounter (Signed)
Pt was seen for annual exam on 06/19/17.Please advise.

## 2017-07-03 NOTE — Telephone Encounter (Signed)
Left message on vm per dpr notifying pt rx for gabapentin 300 mg was sent to pharmacy.

## 2017-08-06 ENCOUNTER — Telehealth: Payer: Self-pay | Admitting: Family Medicine

## 2017-08-06 DIAGNOSIS — Z0000 Encounter for general adult medical examination without abnormal findings: Secondary | ICD-10-CM

## 2017-08-06 NOTE — Telephone Encounter (Signed)
Left message asking pt to call office  Need name of provider  (last colonoscopy) in [place]

## 2017-08-06 NOTE — Telephone Encounter (Signed)
Noted! Thank you
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
## 2017-08-25 ENCOUNTER — Other Ambulatory Visit: Payer: Self-pay | Admitting: Family Medicine

## 2017-09-03 ENCOUNTER — Telehealth: Payer: Self-pay | Admitting: Family Medicine

## 2017-09-03 NOTE — Telephone Encounter (Signed)
Copied from [place] [id number]. Topic: Quick Communication - See Telephone Encounter >> Sep 03, 2017  3:20 PM Conception Chancy, NT wrote: CRM for notification. See Telephone encounter for:  09/03/17.  Patient is needing a refill on Potassium Citrate 15 MEQ please advise.   [facility] [place], [place]

## 2017-09-04 NOTE — Telephone Encounter (Signed)
Called [facility] and ordering physician is  Dr. Maryan Puls. Spoke with pt's wife who stated that is his kidney doctor. Advised her to call that office to refill and to call back for any problems

## 2017-11-05 LAB — HM DIABETES EYE EXAM

## 2017-11-25 DIAGNOSIS — L03012 Cellulitis of left finger: Secondary | ICD-10-CM | POA: Diagnosis not present

## 2017-11-25 DIAGNOSIS — S6010XA Contusion of unspecified finger with damage to nail, initial encounter: Secondary | ICD-10-CM | POA: Diagnosis not present

## 2017-12-19 ENCOUNTER — Encounter: Payer: Self-pay | Admitting: Family Medicine

## 2017-12-19 ENCOUNTER — Ambulatory Visit: Payer: BLUE CROSS/BLUE SHIELD | Admitting: Family Medicine

## 2017-12-19 VITALS — BP 132/82 | HR 67 | Temp 97.8°F | Ht 66.0 in | Wt 191.5 lb

## 2017-12-19 DIAGNOSIS — E1140 Type 2 diabetes mellitus with diabetic neuropathy, unspecified: Secondary | ICD-10-CM

## 2017-12-19 DIAGNOSIS — E1165 Type 2 diabetes mellitus with hyperglycemia: Secondary | ICD-10-CM | POA: Diagnosis not present

## 2017-12-19 DIAGNOSIS — IMO0002 Reserved for concepts with insufficient information to code with codable children: Secondary | ICD-10-CM

## 2017-12-19 DIAGNOSIS — E785 Hyperlipidemia, unspecified: Secondary | ICD-10-CM

## 2017-12-19 DIAGNOSIS — D509 Iron deficiency anemia, unspecified: Secondary | ICD-10-CM | POA: Diagnosis not present

## 2017-12-19 DIAGNOSIS — Z1211 Encounter for screening for malignant neoplasm of colon: Secondary | ICD-10-CM

## 2017-12-19 LAB — BASIC METABOLIC PANEL
BUN: 16 mg/dL (ref 6–23)
CO2: 26 mEq/L (ref 19–32)
Calcium: 10.2 mg/dL (ref 8.4–10.5)
Chloride: 102 mEq/L (ref 96–112)
Creatinine, Ser: 1.02 mg/dL (ref 0.40–1.50)
GFR: 79.26 mL/min (ref >60.00)
Glucose, Bld: 210 mg/dL — ABNORMAL HIGH (ref 70–99)
Potassium: 5 mEq/L (ref 3.5–5.1)
Sodium: 140 mEq/L (ref 135–145)

## 2017-12-19 LAB — CBC WITH DIFFERENTIAL/PLATELET
Basophils Absolute: 0.1 10*3/uL (ref 0.0–0.1)
Basophils Relative: 0.8 % (ref 0.0–3.0)
Eosinophils Absolute: 0.1 10*3/uL (ref 0.0–0.7)
Eosinophils Relative: 1.2 % (ref 0.0–5.0)
HCT: 40.7 % (ref 39.0–52.0)
Hemoglobin: 13.2 g/dL (ref 13.0–17.0)
Lymphocytes Relative: 37.3 % (ref 12.0–46.0)
Lymphs Abs: 2.7 10*3/uL (ref 0.7–4.0)
MCHC: 32.4 g/dL (ref 30.0–36.0)
MCV: 71 fl — ABNORMAL LOW (ref 78.0–100.0)
Monocytes Absolute: 0.7 10*3/uL (ref 0.1–1.0)
Monocytes Relative: 9.6 % (ref 3.0–12.0)
Neutro Abs: 3.7 10*3/uL (ref 1.4–7.7)
Neutrophils Relative %: 51.1 % (ref 43.0–77.0)
Platelets: 215 10*3/uL (ref 150.0–400.0)
RBC: 5.73 Mil/uL (ref 4.22–5.81)
RDW: 15.1 % (ref 11.5–15.5)
WBC: 7.3 10*3/uL (ref 4.0–10.5)

## 2017-12-19 LAB — LDL CHOLESTEROL, DIRECT: Direct LDL: 104 mg/dL

## 2017-12-19 LAB — IBC PANEL
Iron: 59 ug/dL (ref 42–165)
Saturation Ratios: 14.4 % — ABNORMAL LOW (ref 20.0–50.0)
Transferrin: 292 mg/dL (ref 212.0–360.0)

## 2017-12-19 LAB — FERRITIN: Ferritin: 111.1 ng/mL (ref 22.0–322.0)

## 2017-12-19 LAB — HEMOGLOBIN A1C: Hgb A1c MFr Bld: 9 % — ABNORMAL HIGH (ref 4.6–6.5)
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
## 2017-12-19 NOTE — Patient Instructions (Addendum)
Pass by lab for blood work and stool kit.  We will check for type of anemia ?thalassemia.  We will refer you to diabetes education. Work on diabetic diet. Return in 6 months for physical.   Diabetes Mellitus and Nutrition When you have diabetes (diabetes mellitus), it is very important to have healthy eating habits because your blood sugar (glucose) levels are greatly affected by what you eat and drink. Eating healthy foods in the appropriate amounts, at about the same times every day, can help you:  Control your blood glucose.  Lower your risk of heart disease.  Improve your blood pressure.  Reach or maintain a healthy weight.  Every person with diabetes is different, and each person has different needs for a meal plan. Your health care provider may recommend that you work with a diet and nutrition specialist (dietitian) to make a meal plan that is best for you. Your meal plan may vary depending on factors such as:  The calories you need.  The medicines you take.  Your weight.  Your blood glucose, blood pressure, and cholesterol levels.  Your activity level.  Other health conditions you have, such as heart or kidney disease.  How do carbohydrates affect me? Carbohydrates affect your blood glucose level more than any other type of food. Eating carbohydrates naturally increases the amount of glucose in your blood. Carbohydrate counting is a method for keeping track of how many carbohydrates you eat. Counting carbohydrates is important to keep your blood glucose at a healthy level, especially if you use insulin or take certain oral diabetes medicines. It is important to know how many carbohydrates you can safely have in each meal. This is different for every person. Your dietitian can help you calculate how many carbohydrates you should have at each meal and for snack. Foods that contain carbohydrates include:  Bread, cereal, rice, pasta, and crackers.  Potatoes and corn.  Peas,  beans, and lentils.  Milk and yogurt.  Fruit and juice.  Desserts, such as cakes, cookies, ice cream, and candy.  How does alcohol affect me? Alcohol can cause a sudden decrease in blood glucose (hypoglycemia), especially if you use insulin or take certain oral diabetes medicines. Hypoglycemia can be a life-threatening condition. Symptoms of hypoglycemia (sleepiness, dizziness, and confusion) are similar to symptoms of having too much alcohol. If your health care provider says that alcohol is safe for you, follow these guidelines:  Limit alcohol intake to no more than 1 drink per day for nonpregnant women and 2 drinks per day for men. One drink equals 12 oz of beer, 5 oz of wine, or 1 oz of hard liquor.  Do not drink on an empty stomach.  Keep yourself hydrated with water, diet soda, or unsweetened iced tea.  Keep in mind that regular soda, juice, and other mixers may contain a lot of sugar and must be counted as carbohydrates.  What are tips for following this plan? Reading food labels  Start by checking the serving size on the label. The amount of calories, carbohydrates, fats, and other nutrients listed on the label are based on one serving of the food. Many foods contain more than one serving per package.  Check the total grams (g) of carbohydrates in one serving. You can calculate the number of servings of carbohydrates in one serving by dividing the total carbohydrates by 15. For example, if a food has 30 g of total carbohydrates, it would be equal to 2 servings of carbohydrates.  Check  the number of grams (g) of saturated and trans fats in one serving. Choose foods that have low or no amount of these fats.  Check the number of milligrams (mg) of sodium in one serving. Most people should limit total sodium intake to less than 2,300 mg per day.  Always check the nutrition information of foods labeled as "low-fat" or "nonfat". These foods may be higher in added sugar or refined  carbohydrates and should be avoided.  Talk to your dietitian to identify your daily goals for nutrients listed on the label. Shopping  Avoid buying canned, premade, or processed foods. These foods tend to be high in fat, sodium, and added sugar.  Shop around the outside edge of the grocery store. This includes fresh fruits and vegetables, bulk grains, fresh meats, and fresh dairy. Cooking  Use low-heat cooking methods, such as baking, instead of high-heat cooking methods like deep frying.  Cook using healthy oils, such as olive, canola, or sunflower oil.  Avoid cooking with butter, cream, or high-fat meats. Meal planning  Eat meals and snacks regularly, preferably at the same times every day. Avoid going long periods of time without eating.  Eat foods high in fiber, such as fresh fruits, vegetables, beans, and whole grains. Talk to your dietitian about how many servings of carbohydrates you can eat at each meal.  Eat 4-6 ounces of lean protein each day, such as lean meat, chicken, fish, eggs, or tofu. 1 ounce is equal to 1 ounce of meat, chicken, or fish, 1 egg, or 1/4 cup of tofu.  Eat some foods each day that contain healthy fats, such as avocado, nuts, seeds, and fish. Lifestyle   Check your blood glucose regularly.  Exercise at least 30 minutes 5 or more days each week, or as told by your health care provider.  Take medicines as told by your health care provider.  Do not use any products that contain nicotine or tobacco, such as cigarettes and e-cigarettes. If you need help quitting, ask your health care provider.  Work with a Social worker or diabetes educator to identify strategies to manage stress and any emotional and social challenges. What are some questions to ask my health care provider?  Do I need to meet with a diabetes educator?  Do I need to meet with a dietitian?  What number can I call if I have questions?  When are the best times to check my blood  glucose? Where to find more information:  American Diabetes Association: diabetes.org/food-and-fitness/food  Academy of Nutrition and Dietetics: PokerClues.dk  [employer] of Diabetes and Digestive and Kidney Diseases (NIH): ContactWire.be Summary  A healthy meal plan will help you control your blood glucose and maintain a healthy lifestyle.  Working with a diet and nutrition specialist (dietitian) can help you make a meal plan that is best for you.  Keep in mind that carbohydrates and alcohol have immediate effects on your blood glucose levels. It is important to count carbohydrates and to use alcohol carefully. This information is not intended to replace advice given to you by your health care provider. Make sure you discuss any questions you have with your health care provider. Document Released: 03/21/2005 Document Revised: 07/29/2016 Document Reviewed: 07/29/2016 Elsevier Interactive Patient Education  [employer].
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
## 2017-12-19 NOTE — Assessment & Plan Note (Signed)
Chronic, stable but above goal based on recall fasting cbg (150s). Check A1c today. Advised if above goal (<7) would recommend either additional medication or focusing on diabetic diet (not currently strict with this). He would like to try stricter adherence to diabetic diet.  Also agrees to diabetes education classes - will re refer.  RTC 6 mo f/u visit.

## 2017-12-19 NOTE — Addendum Note (Signed)
Addended by: Ellamae Sia on: 12/19/2017 08:57 AM   Modules accepted: Orders

## 2017-12-23 ENCOUNTER — Encounter: Payer: Self-pay | Admitting: Family Medicine

## 2017-12-23 ENCOUNTER — Ambulatory Visit: Payer: BLUE CROSS/BLUE SHIELD | Admitting: Family Medicine

## 2017-12-23 VITALS — BP 130/80 | HR 70 | Temp 97.9°F | Ht 65.5 in | Wt 190.8 lb

## 2017-12-23 DIAGNOSIS — S29012A Strain of muscle and tendon of back wall of thorax, initial encounter: Secondary | ICD-10-CM | POA: Insufficient documentation

## 2017-12-23 DIAGNOSIS — R81 Glycosuria: Secondary | ICD-10-CM

## 2017-12-23 DIAGNOSIS — R109 Unspecified abdominal pain: Secondary | ICD-10-CM | POA: Diagnosis not present

## 2017-12-23 LAB — HEMOGLOBINOPATHY EVALUATION
Fetal Hemoglobin Testing: <1 % (ref 0.0–1.9)
HCT: 43.9 % (ref 38.5–50.0)
Hemoglobin A2 - HGBRFX: 3.4 % (ref 1.8–3.5)
Hemoglobin: 13 g/dL — ABNORMAL LOW (ref 13.2–17.1)
Hgb A: 95.6 % — ABNORMAL LOW (ref >96.0)
MCH: 22 pg — ABNORMAL LOW (ref 27.0–33.0)
MCV: 74.2 fL — ABNORMAL LOW (ref 80.0–100.0)
RBC: 5.92 10*6/uL — ABNORMAL HIGH (ref 4.20–5.80)
RDW: 15.1 % — ABNORMAL HIGH (ref 11.0–15.0)

## 2017-12-23 LAB — POC URINALSYSI DIPSTICK (AUTOMATED)
Bilirubin, UA: NEGATIVE
Blood, UA: NEGATIVE
Glucose, UA: POSITIVE — AB
Ketones, UA: NEGATIVE
Leukocytes, UA: NEGATIVE
Nitrite, UA: NEGATIVE
Protein, UA: NEGATIVE
Spec Grav, UA: 1.02 (ref 1.010–1.025)
Urobilinogen, UA: 0.2 E.U./dL
pH, UA: 6 (ref 5.0–8.0)

## 2017-12-23 MED ORDER — METHOCARBAMOL 500 MG PO TABS: 500.0000 mg | ORAL_TABLET | Freq: Three times a day (TID) | ORAL | 0 refills | Status: DC | PRN

## 2017-12-23 NOTE — Assessment & Plan Note (Signed)
Reassuring exam. Positional pain points to MSK cause. Rx robaxin, tylenol, ice/heat. rec gentle stretching. Update if not improving with treatment. Pt agrees with plan.

## 2017-12-23 NOTE — Patient Instructions (Signed)
I think you have left lat dorsi strain after recent over exertion.  Treat with heat/ice, robaxin muscle relaxant for back, and gentle stretching as tolerated.  Light duty for next several days. Let us know if not improving with this.  Muscle Strain A muscle strain is an injury that occurs when a muscle is stretched beyond its normal length. Usually a small number of muscle fibers are torn when this happens. Muscle strain is rated in degrees. First-degree strains have the least amount of muscle fiber tearing and pain. Second-degree and third-degree strains have increasingly more tearing and pain. Usually, recovery from muscle strain takes 1-2 weeks. Complete healing takes 5-6 weeks. What are the causes? Muscle strain happens when a sudden, violent force placed on a muscle stretches it too far. This may occur with lifting, sports, or a fall. What increases the risk? Muscle strain is especially common in athletes. What are the signs or symptoms? At the site of the muscle strain, there may be:  Pain.  Bruising.  Swelling.  Difficulty using the muscle due to pain or lack of normal function.  How is this diagnosed? Your health care provider will perform a physical exam and ask about your medical history. How is this treated? Often, the best treatment for a muscle strain is resting, icing, and applying cold compresses to the injured area. Follow these instructions at home:  Use the PRICE method of treatment to promote muscle healing during the first 2-3 days after your injury. The PRICE method involves: ? Protecting the muscle from being injured again. ? Restricting your activity and resting the injured body part. ? Icing your injury. To do this, put ice in a plastic bag. Place a towel between your skin and the bag. Then, apply the ice and leave it on from 15-20 minutes each hour. After the third day, switch to moist heat packs. ? Apply compression to the injured area with a splint or  elastic bandage. Be careful not to wrap it too tightly. This may interfere with blood circulation or increase swelling. ? Elevate the injured body part above the level of your heart as often as you can.  Only take over-the-counter or prescription medicines for pain, discomfort, or fever as directed by your health care provider.  Warming up prior to exercise helps to prevent future muscle strains. Contact a health care provider if:  You have increasing pain or swelling in the injured area.  You have numbness, tingling, or a significant loss of strength in the injured area. This information is not intended to replace advice given to you by your health care provider. Make sure you discuss any questions you have with your health care provider. Document Released: 06/24/2005 Document Revised: 11/30/2015 Document Reviewed: 01/21/2013 Elsevier Interactive Patient Education  2017 [employer].

## 2017-12-23 NOTE — Progress Notes (Signed)
BP 130/80 (BP Location: Left Arm, Patient Position: Sitting, Cuff Size: Normal)   Pulse 70   Temp 97.9 F (36.6 C) (Oral)   Ht 5' 5.5" (1.664 m)   Wt 190 lb 12 oz (86.5 kg)   SpO2 96%   BMI 31.26 kg/m    CC: Back pain Subjective:    Patient ID: Blake Manning, male    DOB: [birth year], 62 y.o.   MRN: [id number]  HPI: Blake Manning is a 62 y.o. male presenting on 12/23/2017 for Back Pain (Had hard workday on 12/18/17. Started feeling constant throbbing, dull pain in left flank. Pain is sharp with certain movements. Tried Tyelonol. 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  Tight back after this "tweaked back" Saturday especially bad, yesterday worse - when bending forward felt sharp knife in L flank with radiation to L abdomen. Took some left over oxycodone with some benefit. Also using tylenol for discomfort. Also experiences pain with lifting left leg. Fever/chills over weekend.   No shooting pain down leg, numbness or weakness of leg, loss of control of bowel or bladder.  Some constant pain at left back.   Normal BM this morning.  H/o kidney stones - this has felt somewhat different.  Naprosyn allergy - saw spots before eyes. Tries to avoid NSAIDs.   Relevant past medical, surgical, family and social history reviewed and updated as indicated. Interim medical history since our last visit reviewed. Allergies and medications reviewed and updated. Outpatient Medications Prior to Visit  Medication Sig Dispense Refill  . aspirin (ASPIRIN EC) 81 MG EC tablet Take 1 tablet (81 mg total) by mouth daily. Swallow whole.    . Blood Glucose Monitoring Suppl (BLOOD GLUCOSE METER) kit Accu-Chek Comfort Curve Dx: 250.00  Use to check sugar once daily 1 each 0  . cyanocobalamin (V-R VITAMIN B-12) 500 MCG tablet Take 1 tablet (500 mcg total) by mouth daily.    Marland Kitchen gabapentin (NEURONTIN) 300 MG capsule Take 1 capsule (300 mg total) by mouth at bedtime. 90  capsule 1  . glucose blood (ACCU-CHEK AVIVA PLUS) test strip Use to check sugar once daily. Dx: E11.40; E11.65 50 each 3  . JANUMET 50-1000 MG tablet TAKE 1 TABLET BY MOUTH TWICE DAILY WITH MEALS 60 tablet 3  . Potassium Citrate 15 MEQ (1620 MG) TBCR 2 (two) times daily.   1   No facility-administered medications prior to visit.      Per HPI unless specifically indicated in ROS section below Review of Systems     Objective:    BP 130/80 (BP Location: Left Arm, Patient Position: Sitting, Cuff Size: Normal)   Pulse 70   Temp 97.9 F (36.6 C) (Oral)   Ht 5' 5.5" (1.664 m)   Wt 190 lb 12 oz (86.5 kg)   SpO2 96%   BMI 31.26 kg/m   Wt Readings from Last 3 Encounters:  12/23/17 190 lb 12 oz (86.5 kg)  12/19/17 191 lb 8 oz (86.9 kg)  06/19/17 194 lb 4 oz (88.1 kg)    Physical Exam  Constitutional: He appears well-developed and well-nourished. No distress.  Abdominal: Soft. Bowel sounds are normal. He exhibits no distension and no mass. There is tenderness (chronic after surgery) in the right lower quadrant. There is no rebound, no guarding and no CVA tenderness. No hernia.  Musculoskeletal: He exhibits no edema.  No pain midline spine No paraspinous mm tenderness Point tender at L  lat dorsi along posterior lower ribcage Neg SLR bilaterally. No pain with int/ext rotation at hip. Neg FABER. No pain at SIJ, GTB or sciatic notch bilaterally.   Neurological: He is alert.  5/5 strength BLE  Nursing note and vitals reviewed.  Results for orders placed or performed in visit on 12/23/17  POCT Urinalysis Dipstick (Automated)  Result Value Ref Range   Color, UA yellow    Clarity, UA clear    Glucose, UA Positive (A) Negative   Bilirubin, UA negative    Ketones, UA negative    Spec Grav, UA 1.020 1.010 - 1.025   Blood, UA negative    pH, UA 6.0 5.0 - 8.0   Protein, UA Negative Negative   Urobilinogen, UA 0.2 0.2 or 1.0 E.U./dL   Nitrite, UA negative    Leukocytes, UA Negative  Negative   Lab Results  Component Value Date   CREATININE 1.02 12/19/2017   BUN 16 12/19/2017   NA 140 12/19/2017   K 5.0 12/19/2017   CL 102 12/19/2017   CO2 26 12/19/2017    Lab Results  Component Value Date   HGBA1C 9.0 (H) 12/19/2017       Assessment & Plan:   Problem List Items Addressed This Visit    Strain of latissimus dorsi muscle - Primary    Reassuring exam. Positional pain points to MSK cause. Rx robaxin, tylenol, ice/heat. rec gentle stretching. Update if not improving with treatment. Pt agrees with plan.        Other Visit Diagnoses    Left flank pain       Relevant Orders   POCT Urinalysis Dipstick (Automated) (Completed)   Glucosuria           Meds ordered this encounter  Medications  . methocarbamol (ROBAXIN) 500 MG tablet    Sig: Take 1-2 tablets (500-1,000 mg total) by mouth 3 (three) times daily as needed for muscle spasms.    Dispense:  30 tablet    Refill:  0   Orders Placed This Encounter  Procedures  . POCT Urinalysis Dipstick (Automated)    Follow up plan: No follow-ups on file.  Ria Bush, MD

## 2017-12-29 ENCOUNTER — Encounter: Payer: Self-pay | Admitting: Family Medicine

## 2018-01-01 ENCOUNTER — Other Ambulatory Visit (INDEPENDENT_AMBULATORY_CARE_PROVIDER_SITE_OTHER): Payer: BLUE CROSS/BLUE SHIELD

## 2018-01-01 DIAGNOSIS — Z1211 Encounter for screening for malignant neoplasm of colon: Secondary | ICD-10-CM

## 2018-01-01 LAB — FECAL OCCULT BLOOD, IMMUNOCHEMICAL: Fecal Occult Bld: NEGATIVE

## 2018-01-02 ENCOUNTER — Other Ambulatory Visit: Payer: Self-pay | Admitting: Family Medicine

## 2018-01-02 NOTE — Telephone Encounter (Signed)
Gabapentin Last filled:  12/03/17, #90 Last OV:  12/23/17 Next OV:  01/05/18

## 2018-01-05 ENCOUNTER — Ambulatory Visit (INDEPENDENT_AMBULATORY_CARE_PROVIDER_SITE_OTHER)
Admission: RE | Admit: 2018-01-05 | Discharge: 2018-01-05 | Disposition: A | Discharge status: Home / Self Care | Payer: BLUE CROSS/BLUE SHIELD | Source: Ambulatory Visit | Attending: Family Medicine | Admitting: Family Medicine

## 2018-01-05 ENCOUNTER — Ambulatory Visit: Payer: BLUE CROSS/BLUE SHIELD | Admitting: Family Medicine

## 2018-01-05 ENCOUNTER — Encounter: Payer: Self-pay | Admitting: Family Medicine

## 2018-01-05 VITALS — BP 122/76 | HR 66 | Temp 98.1°F | Ht 65.5 in | Wt 188.2 lb

## 2018-01-05 DIAGNOSIS — M5090 Cervical disc disorder, unspecified, unspecified cervical region: Secondary | ICD-10-CM | POA: Diagnosis not present

## 2018-01-05 DIAGNOSIS — E1140 Type 2 diabetes mellitus with diabetic neuropathy, unspecified: Secondary | ICD-10-CM

## 2018-01-05 DIAGNOSIS — IMO0002 Reserved for concepts with insufficient information to code with codable children: Secondary | ICD-10-CM

## 2018-01-05 DIAGNOSIS — E1165 Type 2 diabetes mellitus with hyperglycemia: Secondary | ICD-10-CM | POA: Diagnosis not present

## 2018-01-05 DIAGNOSIS — S29012D Strain of muscle and tendon of back wall of thorax, subsequent encounter: Secondary | ICD-10-CM | POA: Diagnosis not present

## 2018-01-05 DIAGNOSIS — M542 Cervicalgia: Secondary | ICD-10-CM | POA: Diagnosis not present

## 2018-01-05 MED ORDER — GLIMEPIRIDE 1 MG PO TABS: 0.5000 mg | ORAL_TABLET | Freq: Every day | ORAL | 0 refills | Status: DC

## 2018-01-05 MED ORDER — PREDNISONE 20 MG PO TABS: ORAL_TABLET | ORAL | 0 refills | Status: DC

## 2018-01-05 NOTE — Patient Instructions (Signed)
I am suspicious for cervical pinched nerve or similar causing this pain.  Treat with prednisone taper for 1 week. Sugars will rise - if >150, take glimepiride 1/2-1 tablet daily in the morning while on prednisone.  Continue heating pad, robaxin, oxycodone as needed.  xrays today.

## 2018-01-05 NOTE — Assessment & Plan Note (Signed)
This is significantly improved.

## 2018-01-05 NOTE — Assessment & Plan Note (Addendum)
Acute cervical neck pain with radiculopathy, anticipate HNP related. Prior films showed cervical DDD. Will update films today. Treat with prednisone course (discussed hyperglycemic precautions in detail), heating pad and continued oxycodone use.

## 2018-01-05 NOTE — Progress Notes (Signed)
BP 122/76 (BP Location: Left Arm, Patient Position: Sitting, Cuff Size: Normal)   Pulse 66   Temp 98.1 F (36.7 C) (Oral)   Ht 5' 5.5" (1.664 m)   Wt 188 lb 4 oz (85.4 kg)   SpO2 97%   BMI 30.85 kg/m    CC: neck pain Subjective:    Patient ID: Blake Manning, male    DOB: [birth year], 62 y.o.   MRN: [id number]  HPI: Blake Manning is a 62 y.o. male presenting on 01/05/2018 for Neck Pain (C/o burning pain in left side of posterior neck radiating into left arm. Also, has some finger numbness. Says pain is not worse, just not better. )   1 wk h/o cervical neck pain with radiation down to mid thoracic spine, as well as into left neck and down shoulder. He also has associated numbness at last 2 digits on left. Worse pain with lateral rotation of neck. Denies inciting trauma/injury.   Treating with tylenol, oxycodone, robaxin - no improvement.  Sleeping in recliner because of pain.   Lat dorsi strain did improve over time and with methocarbamol treatment (last visit).  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 No fracture or spondylolisthesis. Electronically Signed   By: Lowella Grip III M.D.   On: 01/05/2016 09:58  Relevant past medical, surgical, family and social history reviewed and updated as indicated. Interim medical history since our last visit reviewed. Allergies and medications reviewed and updated. Outpatient Medications Prior to Visit  Medication Sig Dispense Refill  . aspirin (ASPIRIN EC) 81 MG EC tablet Take 1 tablet (81 mg total) by mouth daily. Swallow whole.    . Blood Glucose Monitoring Suppl (BLOOD GLUCOSE METER) kit Accu-Chek Comfort Curve Dx: 250.00  Use to check sugar once daily 1 each 0  . cyanocobalamin (V-R VITAMIN B-12) 500 MCG tablet Take 1 tablet (500 mcg total) by mouth daily.    Marland Kitchen gabapentin (NEURONTIN) 300 MG capsule TAKE 1 CAPSULE(300 MG) BY MOUTH AT BEDTIME 90 capsule 0  . glucose blood (ACCU-CHEK  AVIVA PLUS) test strip Use to check sugar once daily. Dx: E11.40; E11.65 50 each 3  . JANUMET 50-1000 MG tablet TAKE 1 TABLET BY MOUTH TWICE DAILY WITH MEALS 60 tablet 5  . methocarbamol (ROBAXIN) 500 MG tablet Take 1-2 tablets (500-1,000 mg total) by mouth 3 (three) times daily as needed for muscle spasms. 30 tablet 0  . Potassium Citrate 15 MEQ (1620 MG) TBCR 2 (two) times daily.   1   No facility-administered medications prior to visit.      Per HPI unless specifically indicated in ROS section below Review of Systems     Objective:    BP 122/76 (BP Location: Left Arm, Patient Position: Sitting, Cuff Size: Normal)   Pulse 66   Temp 98.1 F (36.7 C) (Oral)   Ht 5' 5.5" (1.664 m)   Wt 188 lb 4 oz (85.4 kg)   SpO2 97%   BMI 30.85 kg/m   Wt Readings from Last 3 Encounters:  01/05/18 188 lb 4 oz (85.4 kg)  12/23/17 190 lb 12 oz (86.5 kg)  12/19/17 191 lb 8 oz (86.9 kg)    Physical Exam  Constitutional: He appears well-developed and well-nourished. No distress.  Musculoskeletal: Normal range of motion. He exhibits no edema.  Limited ROM bilateral lateral flexion and rotation due to pain No midline cervical spine tenderness Moderate L paracervical mm tenderness and L trapezius mm tenderness FROM at shoulders  Neurological: He is alert.  Sensation largely intact - some diminished sensation to light touch to left 4th/5th digits 5/5 strength BUE Grip strength intact Neg spurling  Skin: Skin is warm and dry. No rash noted.  Psychiatric: He has a normal mood and affect.  Nursing note and vitals reviewed.  Lab Results  Component Value Date   HGBA1C 9.0 (H) 12/19/2017      Assessment & Plan:   Problem List Items Addressed This Visit    Type 2 diabetes, uncontrolled, with neuropathy ([place])    Working on closer adherence to diabetic diet, weight loss and improved glycemic control noted. Given we're starting prednisone course, will prescribe short term amaryl (1/2-1 tablet) to  use while on prednisone. Caution given h/o hypoglycemia to glipizide. See below.       Relevant Medications   glimepiride (AMARYL) 1 MG tablet   Strain of latissimus dorsi muscle    This is significantly improved.       Cervical neck pain with evidence of disc disease - Primary    Acute cervical neck pain with radiculopathy, anticipate HNP related. Prior films showed cervical DDD. Will update films today. Treat with prednisone course (discussed hyperglycemic precautions in detail), heating pad and continued oxycodone use.       Relevant Medications   predniSONE (DELTASONE) 20 MG tablet   Other Relevant Orders   DG Cervical Spine Complete       Meds ordered this encounter  Medications  . predniSONE (DELTASONE) 20 MG tablet    Sig: Take two tablets daily for 3 days followed by one tablet daily for 4 days    Dispense:  10 tablet    Refill:  0  . glimepiride (AMARYL) 1 MG tablet    Sig: Take 0.5-1 tablets (0.5-1 mg total) by mouth daily with breakfast. To take when on prednisone    Dispense:  10 tablet    Refill:  0   Orders Placed This Encounter  Procedures  . DG Cervical Spine Complete    Standing Status:   Future    Number of Occurrences:   1    Standing Expiration Date:   03/09/2019    Order Specific Question:   Reason for Exam (SYMPTOM  OR DIAGNOSIS REQUIRED)    Answer:   cervical neck pain with left radiculopathy    Order Specific Question:   Preferred imaging location?    Answer:   [facility]    Order Specific Question:   Radiology Contrast Protocol - do NOT remove file path    Answer:   [email or web address]    Follow up plan: No follow-ups on file.  Ria Bush, MD

## 2018-01-05 NOTE — Assessment & Plan Note (Addendum)
Working on closer adherence to diabetic diet, weight loss and improved glycemic control noted. Given we're starting prednisone course, will prescribe short term amaryl (1/2-1 tablet) to use while on prednisone. Caution given h/o hypoglycemia to glipizide. See below.

## 2018-04-07 ENCOUNTER — Other Ambulatory Visit: Payer: Self-pay | Admitting: Family Medicine

## 2018-04-07 NOTE — Telephone Encounter (Signed)
Electronic refill request Last office visit 01/05/18 Last refill 01/02/18 #90

## 2018-05-25 ENCOUNTER — Telehealth: Payer: Self-pay

## 2018-05-25 NOTE — Telephone Encounter (Signed)
Patient's wife called in to [facility] on 05/23/18 at 11am reporting that her husband has been doing a lot of heavy work this week, and had a small hemorrhoid approx the size of a corn kernel on outside of rectum on the left side that has been bleeding X 57minutes. They have been icing it on and off.  Patient was instructed to be evaluated by a physician within then next 24hrs (either PCP or urgent care).

## 2018-05-25 NOTE — Telephone Encounter (Signed)
Noted. Agree with seeking more info. Thanks.

## 2018-05-25 NOTE — Telephone Encounter (Signed)
LM on cell VM (okay per DPR) for patient to please call back and update on his condition.  Need to determine if patient still needs further eval of the area if he has not already sought treatment.

## 2018-06-18 ENCOUNTER — Other Ambulatory Visit: Payer: Self-pay | Admitting: Family Medicine

## 2018-06-18 DIAGNOSIS — Z85528 Personal history of other malignant neoplasm of kidney: Secondary | ICD-10-CM

## 2018-06-18 DIAGNOSIS — D563 Thalassemia minor: Secondary | ICD-10-CM

## 2018-06-18 DIAGNOSIS — E1165 Type 2 diabetes mellitus with hyperglycemia: Principal | ICD-10-CM

## 2018-06-18 DIAGNOSIS — R7989 Other specified abnormal findings of blood chemistry: Secondary | ICD-10-CM

## 2018-06-18 DIAGNOSIS — E1140 Type 2 diabetes mellitus with diabetic neuropathy, unspecified: Secondary | ICD-10-CM

## 2018-06-18 DIAGNOSIS — E538 Deficiency of other specified B group vitamins: Secondary | ICD-10-CM

## 2018-06-18 DIAGNOSIS — IMO0002 Reserved for concepts with insufficient information to code with codable children: Secondary | ICD-10-CM

## 2018-06-18 DIAGNOSIS — E785 Hyperlipidemia, unspecified: Secondary | ICD-10-CM

## 2018-06-18 DIAGNOSIS — K760 Fatty (change of) liver, not elsewhere classified: Secondary | ICD-10-CM

## 2018-06-18 DIAGNOSIS — Z125 Encounter for screening for malignant neoplasm of prostate: Secondary | ICD-10-CM

## 2018-06-23 ENCOUNTER — Other Ambulatory Visit (INDEPENDENT_AMBULATORY_CARE_PROVIDER_SITE_OTHER): Payer: BLUE CROSS/BLUE SHIELD

## 2018-06-23 DIAGNOSIS — K760 Fatty (change of) liver, not elsewhere classified: Secondary | ICD-10-CM | POA: Diagnosis not present

## 2018-06-23 DIAGNOSIS — D563 Thalassemia minor: Secondary | ICD-10-CM | POA: Diagnosis not present

## 2018-06-23 DIAGNOSIS — E538 Deficiency of other specified B group vitamins: Secondary | ICD-10-CM

## 2018-06-23 DIAGNOSIS — E1140 Type 2 diabetes mellitus with diabetic neuropathy, unspecified: Secondary | ICD-10-CM | POA: Diagnosis not present

## 2018-06-23 DIAGNOSIS — IMO0002 Reserved for concepts with insufficient information to code with codable children: Secondary | ICD-10-CM

## 2018-06-23 DIAGNOSIS — E1165 Type 2 diabetes mellitus with hyperglycemia: Secondary | ICD-10-CM | POA: Diagnosis not present

## 2018-06-23 DIAGNOSIS — E785 Hyperlipidemia, unspecified: Secondary | ICD-10-CM | POA: Diagnosis not present

## 2018-06-23 DIAGNOSIS — Z125 Encounter for screening for malignant neoplasm of prostate: Secondary | ICD-10-CM

## 2018-06-23 LAB — LIPID PANEL
Cholesterol: 183 mg/dL (ref 0–200)
HDL: 50 mg/dL (ref >39.00)
LDL Cholesterol: 97 mg/dL (ref 0–99)
NonHDL: 133.16
Total CHOL/HDL Ratio: 4
Triglycerides: 180 mg/dL — ABNORMAL HIGH (ref 0.0–149.0)
VLDL: 36 mg/dL (ref 0.0–40.0)

## 2018-06-23 LAB — COMPREHENSIVE METABOLIC PANEL
ALT: 33 U/L (ref 0–53)
AST: 23 U/L (ref 0–37)
Albumin: 4.9 g/dL (ref 3.5–5.2)
Alkaline Phosphatase: 57 U/L (ref 39–117)
BUN: 11 mg/dL (ref 6–23)
CO2: 27 mEq/L (ref 19–32)
Calcium: 9.8 mg/dL (ref 8.4–10.5)
Chloride: 102 mEq/L (ref 96–112)
Creatinine, Ser: 0.94 mg/dL (ref 0.40–1.50)
GFR: 86.95 mL/min (ref >60.00)
Glucose, Bld: 203 mg/dL — ABNORMAL HIGH (ref 70–99)
Potassium: 4.5 mEq/L (ref 3.5–5.1)
Sodium: 138 mEq/L (ref 135–145)
Total Bilirubin: 0.5 mg/dL (ref 0.2–1.2)
Total Protein: 7.5 g/dL (ref 6.0–8.3)

## 2018-06-23 LAB — MICROALBUMIN / CREATININE URINE RATIO
Creatinine,U: 138.6 mg/dL
Microalb Creat Ratio: 2.5 mg/g (ref 0.0–30.0)
Microalb, Ur: 3.4 mg/dL — ABNORMAL HIGH (ref 0.0–1.9)

## 2018-06-23 LAB — CBC WITH DIFFERENTIAL/PLATELET
Basophils Absolute: 0.1 10*3/uL (ref 0.0–0.1)
Basophils Relative: 0.8 % (ref 0.0–3.0)
Eosinophils Absolute: 0.1 10*3/uL (ref 0.0–0.7)
Eosinophils Relative: 2 % (ref 0.0–5.0)
HCT: 40.8 % (ref 39.0–52.0)
Hemoglobin: 12.9 g/dL — ABNORMAL LOW (ref 13.0–17.0)
Lymphocytes Relative: 39.6 % (ref 12.0–46.0)
Lymphs Abs: 2.9 10*3/uL (ref 0.7–4.0)
MCHC: 31.7 g/dL (ref 30.0–36.0)
MCV: 70.2 fl — ABNORMAL LOW (ref 78.0–100.0)
Monocytes Absolute: 0.7 10*3/uL (ref 0.1–1.0)
Monocytes Relative: 9.4 % (ref 3.0–12.0)
Neutro Abs: 3.6 10*3/uL (ref 1.4–7.7)
Neutrophils Relative %: 48.2 % (ref 43.0–77.0)
Platelets: 241 10*3/uL (ref 150.0–400.0)
RBC: 5.81 Mil/uL (ref 4.22–5.81)
RDW: 14.6 % (ref 11.5–15.5)
WBC: 7.4 10*3/uL (ref 4.0–10.5)

## 2018-06-23 LAB — HEMOGLOBIN A1C: Hgb A1c MFr Bld: 9 % — ABNORMAL HIGH (ref 4.6–6.5)

## 2018-06-23 LAB — PSA: PSA: 0.23 ng/mL (ref 0.10–4.00)

## 2018-06-23 LAB — VITAMIN B12: Vitamin B-12: 276 pg/mL (ref 211–911)

## 2018-06-26 ENCOUNTER — Encounter: Payer: BLUE CROSS/BLUE SHIELD | Admitting: Family Medicine

## 2018-06-28 ENCOUNTER — Encounter: Payer: Self-pay | Admitting: Family Medicine

## 2018-06-28 NOTE — Assessment & Plan Note (Signed)
Preventative protocols reviewed and updated unless pt declined. Discussed healthy diet and lifestyle.  

## 2018-06-28 NOTE — Progress Notes (Signed)
BP 140/80 (BP Location: Left Arm, Patient Position: Sitting, Cuff Size: Normal)   Pulse 64   Temp 97.8 F (36.6 C) (Oral)   Ht 5' 6" (1.676 m)   Wt 187 lb 8 oz (85 kg)   SpO2 97%   BMI 30.26 kg/m    CC: CPE Subjective:    Patient ID: Blake Manning, male    DOB: [birth year], 62 y.o.   MRN: [id number]  HPI: Blake Manning is a 62 y.o. male presenting on 06/29/2018 for Annual Exam   H/o clear cell RCC followed by urology Dr Eliberto Ivory.  Takes potassium for kidney stones.  DM - on janumet 50/1000mg bid. Has accucheck meter - trouble with strips. Tries to follow diabetic diet. Trouble scheduling DSME due to traveling work schedule. Takes gabapentin for neuropathy. No fmhx thyroid cancer.   Pulsatile tinnitus for the past year bilaterally. 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 Notes connection of tinnitus to heart beat. No hearing loss. No headaches. Tinnitus becomes constant when he opens his mouth. Severe claustrophobia trouble.   Preventative: Colonoscopy 2012@ [place] told all normal - unable to receive records despite multiple attempts. iFOB normal 12/2017.  Prostate cancer screening - followed by urologist Eliberto Ivory) who he sees yearly for h/o kidney cancer. Mild enlargement.  Flu shot - today Tetanus shot - 2011.  Pneumovax - 2015.  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 Interim medical history since our last visit reviewed. Allergies and medications reviewed and updated. Outpatient Medications Prior to Visit  Medication Sig Dispense Refill    . Blood Glucose Monitoring Suppl (BLOOD GLUCOSE METER) kit Accu-Chek Comfort Curve Dx: 250.00  Use to check sugar once daily 1 each 0  . gabapentin (NEURONTIN) 300 MG capsule TAKE 1 CAPSULE(300 MG) BY MOUTH AT BEDTIME 90 capsule 1  . glucose blood (ACCU-CHEK AVIVA PLUS) test strip Use to check sugar once daily. Dx: E11.40; E11.65 50 each 3  . JANUMET 50-1000 MG tablet TAKE 1 TABLET BY MOUTH TWICE DAILY WITH MEALS 60 tablet 5  . Potassium Citrate 15 MEQ (1620 MG) TBCR 2 (two) times daily.   1  . glimepiride (AMARYL) 1 MG tablet Take 0.5-1 tablets (0.5-1 mg total) by mouth daily with breakfast. To take when on prednisone 10 tablet 0  . aspirin (ASPIRIN EC) 81 MG EC tablet Take 1 tablet (81 mg total) by mouth daily. Swallow whole.    . cyanocobalamin (V-R VITAMIN B-12) 500 MCG tablet Take 1 tablet (500 mcg total) by mouth daily.    . methocarbamol (ROBAXIN) 500 MG tablet Take 1-2 tablets (500-1,000 mg total) by mouth 3 (three) times daily as needed for muscle spasms. 30 tablet 0  . predniSONE (DELTASONE) 20 MG tablet Take two tablets daily for 3 days followed by one tablet daily for 4 days 10 tablet 0   No facility-administered medications prior to visit.      Per HPI unless specifically indicated in ROS section below Review of Systems  Constitutional: Negative for activity change, appetite change, chills, fatigue, fever and unexpected weight  change.  HENT: Negative for hearing loss.   Eyes: Negative for visual disturbance.  Respiratory: Negative for cough, chest tightness, shortness of breath and wheezing.   Cardiovascular: Negative for chest pain, palpitations and leg swelling.  Gastrointestinal: Negative for abdominal distention, abdominal pain, blood in stool, constipation, diarrhea, nausea and vomiting.  Genitourinary: Negative for difficulty urinating and hematuria.  Musculoskeletal: Negative for arthralgias, myalgias and neck pain.  Skin: Negative for rash.  Neurological: Negative for  dizziness, seizures, syncope and headaches.  Hematological: Negative for adenopathy. Does not bruise/bleed easily.  Psychiatric/Behavioral: Negative for dysphoric mood. The patient is not nervous/anxious.        Objective:    BP 140/80 (BP Location: Left Arm, Patient Position: Sitting, Cuff Size: Normal)   Pulse 64   Temp 97.8 F (36.6 C) (Oral)   Ht 5' 6" (1.676 m)   Wt 187 lb 8 oz (85 kg)   SpO2 97%   BMI 30.26 kg/m   Wt Readings from Last 3 Encounters:  06/29/18 187 lb 8 oz (85 kg)  01/05/18 188 lb 4 oz (85.4 kg)  12/23/17 190 lb 12 oz (86.5 kg)    Physical Exam Vitals signs and nursing note reviewed.  Constitutional:      General: He is not in acute distress.    Appearance: He is well-developed.  HENT:     Head: Normocephalic and atraumatic.     Right Ear: Hearing, tympanic membrane, ear canal and external ear normal.     Left Ear: Hearing, tympanic membrane, ear canal and external ear normal.     Nose: Nose normal. No mucosal edema or rhinorrhea.     Right Sinus: No maxillary sinus tenderness or frontal sinus tenderness.     Left Sinus: No maxillary sinus tenderness or frontal sinus tenderness.     Mouth/Throat:     Pharynx: Uvula midline. No oropharyngeal exudate or posterior oropharyngeal erythema.     Tonsils: No tonsillar abscesses.  Eyes:     General: No scleral icterus.    Conjunctiva/sclera: Conjunctivae normal.     Pupils: Pupils are equal, round, and reactive to light.  Neck:     Musculoskeletal: Normal range of motion and neck supple.     Vascular: No carotid bruit.     Comments: No bruits appreciated Cardiovascular:     Rate and Rhythm: Normal rate and regular rhythm.     Heart sounds: Normal heart sounds. No murmur.  Pulmonary:     Effort: Pulmonary effort is normal. No respiratory distress.     Breath sounds: Normal breath sounds. No wheezing or rales.  Lymphadenopathy:     Cervical: No cervical adenopathy.  Skin:    General: Skin is warm and  dry.     Findings: No rash.    Results for orders placed or performed in visit on 06/23/18  Microalbumin / creatinine urine ratio  Result Value Ref Range   Microalb, Ur 3.4 (H) 0.0 - 1.9 mg/dL   Creatinine,U 138.6 mg/dL   Microalb Creat Ratio 2.5 0.0 - 30.0 mg/g  Vitamin B12  Result Value Ref Range   Vitamin B-12 276 211 - 911 pg/mL  CBC with Differential/Platelet  Result Value Ref Range   WBC 7.4 4.0 - 10.5 K/uL   RBC 5.81 4.22 - 5.81 Mil/uL   Hemoglobin 12.9 (L) 13.0 - 17.0 g/dL   HCT 40.8 39.0 - 52.0 %   MCV 70.2 (L) 78.0 - 100.0 fl   MCHC 31.7 30.0 - 36.0 g/dL  RDW 14.6 11.5 - 15.5 %   Platelets 241.0 150.0 - 400.0 K/uL   Neutrophils Relative % 48.2 43.0 - 77.0 %   Lymphocytes Relative 39.6 12.0 - 46.0 %   Monocytes Relative 9.4 3.0 - 12.0 %   Eosinophils Relative 2.0 0.0 - 5.0 %   Basophils Relative 0.8 0.0 - 3.0 %   Neutro Abs 3.6 1.4 - 7.7 K/uL   Lymphs Abs 2.9 0.7 - 4.0 K/uL   Monocytes Absolute 0.7 0.1 - 1.0 K/uL   Eosinophils Absolute 0.1 0.0 - 0.7 K/uL   Basophils Absolute 0.1 0.0 - 0.1 K/uL  PSA  Result Value Ref Range   PSA 0.23 0.10 - 4.00 ng/mL  Hemoglobin A1c  Result Value Ref Range   Hgb A1c MFr Bld 9.0 (H) 4.6 - 6.5 %  Comprehensive metabolic panel  Result Value Ref Range   Sodium 138 135 - 145 mEq/L   Potassium 4.5 3.5 - 5.1 mEq/L   Chloride 102 96 - 112 mEq/L   CO2 27 19 - 32 mEq/L   Glucose, Bld 203 (H) 70 - 99 mg/dL   BUN 11 6 - 23 mg/dL   Creatinine, Ser 0.94 0.40 - 1.50 mg/dL   Total Bilirubin 0.5 0.2 - 1.2 mg/dL   Alkaline Phosphatase 57 39 - 117 U/L   AST 23 0 - 37 U/L   ALT 33 0 - 53 U/L   Total Protein 7.5 6.0 - 8.3 g/dL   Albumin 4.9 3.5 - 5.2 g/dL   Calcium 9.8 8.4 - 10.5 mg/dL   GFR 86.95 >60.00 mL/min  Lipid panel  Result Value Ref Range   Cholesterol 183 0 - 200 mg/dL   Triglycerides 180.0 (H) 0.0 - 149.0 mg/dL   HDL 50.00 >39.00 mg/dL   VLDL 36.0 0.0 - 40.0 mg/dL   LDL Cholesterol 97 0 - 99 mg/dL   Total CHOL/HDL Ratio  4    NonHDL 133.16    Lab Results  Component Value Date   IRON 59 12/19/2017   FERRITIN 111.1 12/19/2017       Assessment & Plan:   Problem List Items Addressed This Visit    Type 2 diabetes, uncontrolled, with neuropathy (HCC)    Chronic, uncontrolled despite daily janumet. I have him to call to schedule DSME. I have also asked him to price out weekly GLP1 RA and provided trulicity booklet. Hesitant for SLGT2 in h/o renal cancer. For now, continue janumet. If above unaffordable, consider starting amaryl. RTC 3 mo f/u visit.       Relevant Medications   rosuvastatin (CRESTOR) 5 MG tablet   Thalassemia minor    Stable period.       Relevant Medications   vitamin B-12 (V-R VITAMIN B-12) 500 MCG tablet   Pulsatile tinnitus of both ears    Recommend further evaluation for this. Would start with contrasted MRI or ENT referral. Will ask which patient would prefer to start with.       Obesity, Class I, BMI 30-34.9    Encouraged healthy diet and lifestyle changes to affect sustainable weight loss.      Low vitamin B12 level    rec start 537mg daily OTC.       Hyperlipidemia    Chronic, stable. H/o statin intolerance. Off RYR. Will trial once weekly statin to see if better tolerated. He continues aspirin 817mdaily. The 10-year ASCVD risk score (GMikey BussingC Jr., et al., 2013) is: 17.1%   Values used to calculate the  score:     Age: 38 years     Sex: Male     Is Non-Hispanic African American: No     Diabetic: Yes     Tobacco smoker: No     Systolic Blood Pressure: 734 mmHg     Is BP treated: No     HDL Cholesterol: 50 mg/dL     Total Cholesterol: 183 mg/dL       Relevant Medications   rosuvastatin (CRESTOR) 5 MG tablet   History of renal cell cancer    Sees uro yearly.      History of kidney stones    Takes potassium for this.       Health maintenance examination - Primary    Preventative protocols reviewed and updated unless pt declined. Discussed healthy diet and  lifestyle.       Fatty liver    LFTs normal. Consider updated abdominal imaging.       Claustrophobia    Severe - has been unable to complete MRI in the past.        Other Visit Diagnoses    Need for influenza vaccination       Relevant Orders   Flu Vaccine QUAD 36+ mos IM (Completed)       Meds ordered this encounter  Medications  . rosuvastatin (CRESTOR) 5 MG tablet    Sig: Take 1 tablet (5 mg total) by mouth once a week.    Dispense:  12 tablet    Refill:  3  . vitamin B-12 (V-R VITAMIN B-12) 500 MCG tablet    Sig: Take 1 tablet (500 mcg total) by mouth daily.   Orders Placed This Encounter  Procedures  . Flu Vaccine QUAD 36+ mos IM    Follow up plan: Return in about 3 months (around 09/28/2018) for follow up visit.  Ria Bush, MD

## 2018-06-29 ENCOUNTER — Ambulatory Visit (INDEPENDENT_AMBULATORY_CARE_PROVIDER_SITE_OTHER): Payer: BLUE CROSS/BLUE SHIELD | Admitting: Family Medicine

## 2018-06-29 ENCOUNTER — Telehealth: Payer: Self-pay | Admitting: Family Medicine

## 2018-06-29 ENCOUNTER — Encounter: Payer: Self-pay | Admitting: Family Medicine

## 2018-06-29 VITALS — BP 140/80 | HR 64 | Temp 97.8°F | Ht 66.0 in | Wt 187.5 lb

## 2018-06-29 DIAGNOSIS — K760 Fatty (change of) liver, not elsewhere classified: Secondary | ICD-10-CM

## 2018-06-29 DIAGNOSIS — H93A3 Pulsatile tinnitus, bilateral: Secondary | ICD-10-CM | POA: Insufficient documentation

## 2018-06-29 DIAGNOSIS — R7989 Other specified abnormal findings of blood chemistry: Secondary | ICD-10-CM

## 2018-06-29 DIAGNOSIS — Z87442 Personal history of urinary calculi: Secondary | ICD-10-CM

## 2018-06-29 DIAGNOSIS — Z85528 Personal history of other malignant neoplasm of kidney: Secondary | ICD-10-CM | POA: Diagnosis not present

## 2018-06-29 DIAGNOSIS — Z0000 Encounter for general adult medical examination without abnormal findings: Secondary | ICD-10-CM | POA: Diagnosis not present

## 2018-06-29 DIAGNOSIS — E1165 Type 2 diabetes mellitus with hyperglycemia: Secondary | ICD-10-CM

## 2018-06-29 DIAGNOSIS — E669 Obesity, unspecified: Secondary | ICD-10-CM

## 2018-06-29 DIAGNOSIS — Z23 Encounter for immunization: Secondary | ICD-10-CM | POA: Diagnosis not present

## 2018-06-29 DIAGNOSIS — F40240 Claustrophobia: Secondary | ICD-10-CM

## 2018-06-29 DIAGNOSIS — E1140 Type 2 diabetes mellitus with diabetic neuropathy, unspecified: Secondary | ICD-10-CM

## 2018-06-29 DIAGNOSIS — IMO0002 Reserved for concepts with insufficient information to code with codable children: Secondary | ICD-10-CM

## 2018-06-29 DIAGNOSIS — E66811 Obesity, class 1: Secondary | ICD-10-CM

## 2018-06-29 DIAGNOSIS — E538 Deficiency of other specified B group vitamins: Secondary | ICD-10-CM

## 2018-06-29 DIAGNOSIS — D563 Thalassemia minor: Secondary | ICD-10-CM

## 2018-06-29 DIAGNOSIS — E785 Hyperlipidemia, unspecified: Secondary | ICD-10-CM

## 2018-06-29 MED ORDER — CYANOCOBALAMIN 500 MCG PO TABS: 500.0000 ug | ORAL_TABLET | Freq: Every day | ORAL | Status: DC

## 2018-06-29 MED ORDER — ROSUVASTATIN CALCIUM 5 MG PO TABS: 5.0000 mg | ORAL_TABLET | ORAL | 3 refills | Status: DC

## 2018-06-29 NOTE — Patient Instructions (Addendum)
Flu shot today Call this week to set up diabetes classes for next time you're in town.  Start b12 578mcg daily for borderline low levels. Start once weekly cholesterol medicine (crestor 5mg  daily).  For diabetes - price out ozempic or trulicity once a week injections in place of januvia. Let me know what you find out.  Return as needed or in 3-4 months for follow up visit on diabetes.  I will look into options for open imaging for pulsing tinnitus and we will be in touch.    Health Maintenance, Male A healthy lifestyle and preventive care is important for your health and wellness. Ask your health care provider about what schedule of regular examinations is right for you. What should I know about weight and diet? Eat a Healthy Diet  Eat plenty of vegetables, fruits, whole grains, low-fat dairy products, and lean protein.  Do not eat a lot of foods high in solid fats, added sugars, or salt.  Maintain a Healthy Weight Regular exercise can help you achieve or maintain a healthy weight. You should:  Do at least 150 minutes of exercise each week. The exercise should increase your heart rate and make you sweat (moderate-intensity exercise).  Do strength-training exercises at least twice a week. Watch Your Levels of Cholesterol and Blood Lipids  Have your blood tested for lipids and cholesterol every 5 years starting at 60 years of age. If you are at high risk for heart disease, you should start having your blood tested when you are 60 years old. You may need to have your cholesterol levels checked more often if: ? Your lipid or cholesterol levels are high. ? You are older than 60 years of age. ? You are at high risk for heart disease. What should I know about cancer screening? Many types of cancers can be detected early and may often be prevented. Lung Cancer  You should be screened every year for lung cancer if: ? You are a current smoker who has smoked for at least 30 years. ? You are a  former smoker who has quit within the past 15 years.  Talk to your health care provider about your screening options, when you should start screening, and how often you should be screened. Colorectal Cancer  Routine colorectal cancer screening usually begins at 60 years of age and should be repeated every 5-10 years until you are 60 years old. You may need to be screened more often if early forms of precancerous polyps or small growths are found. Your health care provider may recommend screening at an earlier age if you have risk factors for colon cancer.  Your health care provider may recommend using home test kits to check for hidden blood in the stool.  A small camera at the end of a tube can be used to examine your colon (sigmoidoscopy or colonoscopy). This checks for the earliest forms of colorectal cancer. Prostate and Testicular Cancer  Depending on your age and overall health, your health care provider may do certain tests to screen for prostate and testicular cancer.  Talk to your health care provider about any symptoms or concerns you have about testicular or prostate cancer. Skin Cancer  Check your skin from head to toe regularly.  Tell your health care provider about any new moles or changes in moles, especially if: ? There is a change in a mole's size, shape, or color. ? You have a mole that is larger than a pencil eraser.  Always use  sunscreen. Apply sunscreen liberally and repeat throughout the day.  Protect yourself by wearing long sleeves, pants, a wide-brimmed hat, and sunglasses when outside. What should I know about heart disease, diabetes, and high blood pressure?  If you are 35-28 years of age, have your blood pressure checked every 3-5 years. If you are 46 years of age or older, have your blood pressure checked every year. You should have your blood pressure measured twice-once when you are at a hospital or clinic, and once when you are not at a hospital or clinic.  Record the average of the two measurements. To check your blood pressure when you are not at a hospital or clinic, you can use: ? An automated blood pressure machine at a pharmacy. ? A home blood pressure monitor.  Talk to your health care provider about your target blood pressure.  If you are between 46-17 years old, ask your health care provider if you should take aspirin to prevent heart disease.  Have regular diabetes screenings by checking your fasting blood sugar level. ? If you are at a normal weight and have a low risk for diabetes, have this test once every three years after the age of 22. ? If you are overweight and have a high risk for diabetes, consider being tested at a younger age or more often.  A one-time screening for abdominal aortic aneurysm (AAA) by ultrasound is recommended for men aged 39-75 years who are current or former smokers. What should I know about preventing infection? Hepatitis B If you have a higher risk for hepatitis B, you should be screened for this virus. Talk with your health care provider to find out if you are at risk for hepatitis B infection. Hepatitis C Blood testing is recommended for:  Everyone born from [birth year] through 1963.  Anyone with known risk factors for hepatitis C. Sexually Transmitted Diseases (STDs)  You should be screened each year for STDs including gonorrhea and chlamydia if: ? You are sexually active and are younger than 60 years of age. ? You are older than 60 years of age and your health care provider tells you that you are at risk for this type of infection. ? Your sexual activity has changed since you were last screened and you are at an increased risk for chlamydia or gonorrhea. Ask your health care provider if you are at risk.  Talk with your health care provider about whether you are at high risk of being infected with HIV. Your health care provider may recommend a prescription medicine to help prevent HIV infection. What  else can I do?  Schedule regular health, dental, and eye exams.  Stay current with your vaccines (immunizations).  Do not use any tobacco products, such as cigarettes, chewing tobacco, and e-cigarettes. If you need help quitting, ask your health care provider.  Limit alcohol intake to no more than 2 drinks per day. One drink equals 12 ounces of beer, 5 ounces of wine, or 1 ounces of hard liquor.  Do not use street drugs.  Do not share needles.  Ask your health care provider for help if you need support or information about quitting drugs.  Tell your health care provider if you often feel depressed.  Tell your health care provider if you have ever been abused or do not feel safe at home. This information is not intended to replace advice given to you by your health care provider. Make sure you discuss any questions you have with your  health care provider. Document Released: 12/21/2007 Document Revised: 02/21/2016 Document Reviewed: 03/28/2015 Elsevier Interactive Patient Education  2019 [employer].

## 2018-06-29 NOTE — Assessment & Plan Note (Signed)
Severe - has been unable to complete MRI in the past.

## 2018-06-29 NOTE — Assessment & Plan Note (Signed)
Encouraged healthy diet and lifestyle changes to affect sustainable weight loss.  

## 2018-06-29 NOTE — Assessment & Plan Note (Addendum)
Recommend further evaluation for this. Would start with contrasted MRI or ENT referral. Will ask which patient would prefer to start with.

## 2018-06-29 NOTE — Assessment & Plan Note (Signed)
Stable period.  

## 2018-06-29 NOTE — Telephone Encounter (Signed)
We discussed at OV -  For pulsatile tinnitus, recommend either further evaluation with ENT referral or we could order contrasted MRI trying to find an open MRI machine. Let me know which patient would prefer to start with.

## 2018-06-29 NOTE — Assessment & Plan Note (Signed)
LFTs normal. Consider updated abdominal imaging.

## 2018-06-29 NOTE — Assessment & Plan Note (Signed)
Takes potassium for this.

## 2018-06-29 NOTE — Assessment & Plan Note (Signed)
Sees uro yearly.

## 2018-06-29 NOTE — Assessment & Plan Note (Addendum)
Chronic, stable. H/o statin intolerance. Off RYR. Will trial once weekly statin to see if better tolerated. He continues aspirin 81mg  daily. The 10-year ASCVD risk score Blake Manning., et al., 2013) is: 17.1%   Values used to calculate the score:     Age: 62 years     Sex: Male     Is Non-Hispanic African American: No     Diabetic: Yes     Tobacco smoker: No     Systolic Blood Pressure: 528 mmHg     Is BP treated: No     HDL Cholesterol: 50 mg/dL     Total Cholesterol: 183 mg/dL

## 2018-06-29 NOTE — Telephone Encounter (Signed)
Spoke with pt relaying Dr. Synthia Innocent message.  Pt verbalizes understanding and states he will talk it over with his wife and call back with at decision. Fyi to Dr. Darnell Level.

## 2018-06-29 NOTE — Assessment & Plan Note (Addendum)
Chronic, uncontrolled despite daily janumet. I have him to call to schedule DSME. I have also asked him to price out weekly GLP1 RA and provided trulicity booklet. Hesitant for SLGT2 in h/o renal cancer. For now, continue janumet. If above unaffordable, consider starting amaryl. RTC 3 mo f/u visit.

## 2018-06-29 NOTE — Assessment & Plan Note (Signed)
rec start 570mcg daily OTC.

## 2018-07-06 ENCOUNTER — Other Ambulatory Visit: Payer: Self-pay | Admitting: Family Medicine

## 2018-07-13 ENCOUNTER — Other Ambulatory Visit: Payer: Self-pay | Admitting: *Deleted

## 2018-07-13 MED ORDER — SITAGLIPTIN PHOS-METFORMIN HCL 50-1000 MG PO TABS: 1.0000 | ORAL_TABLET | Freq: Two times a day (BID) | ORAL | 1 refills | Status: DC

## 2018-07-13 NOTE — Telephone Encounter (Signed)
Spoke to pts wife who states their insurance has changed and is now requiring them to have a 90d supply and to switch from [facility] to [facility]. New Rx sent as requested.

## 2018-10-02 ENCOUNTER — Telehealth: Payer: Self-pay | Admitting: Family Medicine

## 2018-10-02 NOTE — Telephone Encounter (Signed)
Left message asking pt to call office offer web ex visit for 4/1

## 2018-10-07 ENCOUNTER — Ambulatory Visit: Payer: BLUE CROSS/BLUE SHIELD | Admitting: Family Medicine

## 2018-10-26 ENCOUNTER — Telehealth: Payer: Self-pay | Admitting: Family Medicine

## 2018-10-26 NOTE — Telephone Encounter (Signed)
Called to schedule pt for A1C labs and virtual visit with Dr. Danise Mina. Left voicemail asking him to call office.

## 2018-12-12 ENCOUNTER — Other Ambulatory Visit: Payer: Self-pay | Admitting: Family Medicine

## 2018-12-14 NOTE — Telephone Encounter (Signed)
Last office visit 06/29/2018 for CPE.  Last refilled 04/08/2018 for #90 with 1 refill.  No future appointments.

## 2019-01-12 ENCOUNTER — Other Ambulatory Visit: Payer: Self-pay | Admitting: Family Medicine

## 2019-01-13 NOTE — Telephone Encounter (Signed)
Electronic script request for a 90 day supply Last office visit 06/30/19 Last refill 12/16/18 #30/3

## 2019-01-23 ENCOUNTER — Other Ambulatory Visit: Payer: Self-pay

## 2019-01-23 ENCOUNTER — Emergency Department: Payer: BC Managed Care – PPO

## 2019-01-23 ENCOUNTER — Encounter: Payer: Self-pay | Admitting: Emergency Medicine

## 2019-01-23 ENCOUNTER — Emergency Department
Admission: EM | Admit: 2019-01-23 | Discharge: 2019-01-23 | Disposition: A | Discharge status: Home / Self Care | Payer: BC Managed Care – PPO | Attending: Emergency Medicine | Admitting: Emergency Medicine

## 2019-01-23 DIAGNOSIS — E119 Type 2 diabetes mellitus without complications: Secondary | ICD-10-CM | POA: Insufficient documentation

## 2019-01-23 DIAGNOSIS — I10 Essential (primary) hypertension: Secondary | ICD-10-CM | POA: Insufficient documentation

## 2019-01-23 DIAGNOSIS — Z85528 Personal history of other malignant neoplasm of kidney: Secondary | ICD-10-CM | POA: Insufficient documentation

## 2019-01-23 DIAGNOSIS — M79671 Pain in right foot: Secondary | ICD-10-CM | POA: Insufficient documentation

## 2019-01-23 DIAGNOSIS — E785 Hyperlipidemia, unspecified: Secondary | ICD-10-CM | POA: Insufficient documentation

## 2019-01-23 DIAGNOSIS — S90811A Abrasion, right foot, initial encounter: Secondary | ICD-10-CM | POA: Diagnosis not present

## 2019-01-23 DIAGNOSIS — Z7982 Long term (current) use of aspirin: Secondary | ICD-10-CM | POA: Diagnosis not present

## 2019-01-23 DIAGNOSIS — Z79899 Other long term (current) drug therapy: Secondary | ICD-10-CM | POA: Insufficient documentation

## 2019-01-23 LAB — COMPREHENSIVE METABOLIC PANEL
ALT: 38 U/L (ref 0–44)
AST: 26 U/L (ref 15–41)
Albumin: 4.6 g/dL (ref 3.5–5.0)
Alkaline Phosphatase: 64 U/L (ref 38–126)
Anion gap: 10 (ref 5–15)
BUN: 13 mg/dL (ref 6–20)
CO2: 23 mmol/L (ref 22–32)
Calcium: 9.5 mg/dL (ref 8.9–10.3)
Chloride: 103 mmol/L (ref 98–111)
Creatinine, Ser: 1.01 mg/dL (ref 0.61–1.24)
GFR calc Af Amer: >60 mL/min (ref >60)
GFR calc non Af Amer: >60 mL/min (ref >60)
Glucose, Bld: 142 mg/dL — ABNORMAL HIGH (ref 70–99)
Potassium: 3.6 mmol/L (ref 3.5–5.1)
Sodium: 136 mmol/L (ref 135–145)
Total Bilirubin: 0.8 mg/dL (ref 0.3–1.2)
Total Protein: 7.8 g/dL (ref 6.5–8.1)

## 2019-01-23 LAB — CBC WITH DIFFERENTIAL/PLATELET
Abs Immature Granulocytes: 0.03 10*3/uL (ref 0.00–0.07)
Basophils Absolute: 0.1 10*3/uL (ref 0.0–0.1)
Basophils Relative: 1 %
Eosinophils Absolute: 0.1 10*3/uL (ref 0.0–0.5)
Eosinophils Relative: 1 %
HCT: 39.2 % (ref 39.0–52.0)
Hemoglobin: 12.4 g/dL — ABNORMAL LOW (ref 13.0–17.0)
Immature Granulocytes: 0 %
Lymphocytes Relative: 33 %
Lymphs Abs: 3.1 10*3/uL (ref 0.7–4.0)
MCH: 22.1 pg — ABNORMAL LOW (ref 26.0–34.0)
MCHC: 31.6 g/dL (ref 30.0–36.0)
MCV: 70 fL — ABNORMAL LOW (ref 80.0–100.0)
Monocytes Absolute: 0.7 10*3/uL (ref 0.1–1.0)
Monocytes Relative: 8 %
Neutro Abs: 5.6 10*3/uL (ref 1.7–7.7)
Neutrophils Relative %: 57 %
Platelets: 246 10*3/uL (ref 150–400)
RBC: 5.6 MIL/uL (ref 4.22–5.81)
RDW: 14.4 % (ref 11.5–15.5)
WBC: 9.6 10*3/uL (ref 4.0–10.5)
nRBC: 0 % (ref 0.0–0.2)

## 2019-01-23 MED ORDER — CEPHALEXIN 250 MG PO CAPS: 250.0000 mg | ORAL_CAPSULE | Freq: Four times a day (QID) | ORAL | 0 refills | Status: AC

## 2019-01-23 NOTE — ED Notes (Signed)
Patient transported to X-ray 

## 2019-01-23 NOTE — ED Triage Notes (Signed)
Abrasion noted bottom of foot today. Doesn't remember injuring it. States is diabetic and has decreased sensation in feet. FSBS 146 right before coming in.

## 2019-01-23 NOTE — ED Provider Notes (Signed)
[facility] Emergency Department Provider Note  ____________________________________________   First MD Initiated Contact with Patient 01/23/19 1858     (approximate)  I have reviewed the triage vital signs and the nursing notes.   HISTORY  Chief Complaint Foot Pain    HPI Blake Manning is a 63 y.o. male with history of diabetes who presents for foot injury.  Patient states that he noted abrasion to the bottom of his foot today.  He does not remember injuring it.  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  Patient is noted that it was a little bit more swollen on the bottom of his foot, onset today, nothing makes it better, nothing makes it worse.  He is unsure how he injured it.  He says he was walking around outside of the pool barefooted.  He denies any fevers chills.       Past Medical History:  Diagnosis Date  . DDD (degenerative disc disease), lumbar 01/2014   severe by CT scan at L4/5 and mod at L3/4 and L5/S1, severe R L4/5 and L L5/S1 neural foraminal narrowing  . Diabetes type 2, controlled ([place]) 2010   referred for DSME (05/2013) but pt did not return phone call  . Fatty liver 01/2014   by CT scan  . History of kidney stones    uric acid  . Hyperlipidemia   . Renal cancer ([place]) 2009   part of R kidney removed (Dr. 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 Pulsatile tinnitus of both ears 06/29/2018  . Thalassemia minor 06/21/2017  . Low vitamin B12 level 06/21/2017  . Claustrophobia 11/23/2016  . Dizziness 11/01/2016  . Cervical neck pain with evidence of disc disease 01/05/2016  . Obesity, Class I, BMI 30-34.9 06/12/2015  . Fatty liver 01/05/2014  . DDD (degenerative disc disease), lumbar 01/05/2014  . Health maintenance examination 09/21/2013  . History of renal cell  cancer   . Type 2 diabetes, uncontrolled, with neuropathy ([place])   . Hyperlipidemia   . History of kidney stones     Past Surgical History:  Procedure Laterality Date  . KNEE SURGERY Left 1980s   ?meniscus  . PARTIAL NEPHRECTOMY Right 2009   RCC, negative margins    Prior to Admission medications   Medication Sig Start Date End Date Taking? Authorizing Provider  aspirin (ASPIRIN EC) 81 MG EC tablet Take 1 tablet (81 mg total) by mouth daily. Swallow whole. 11/01/16   Ria Bush, MD  Blood Glucose Monitoring Suppl (BLOOD GLUCOSE METER) kit Accu-Chek Comfort Curve Dx: 250.00  Use to check sugar once daily 09/08/13   Ria Bush, MD  gabapentin (NEURONTIN) 300 MG capsule TAKE 1 CAPSULE BY MOUTH EVERYDAY AT BEDTIME 01/15/19   Ria Bush, MD  glucose blood (ACCU-CHEK AVIVA PLUS) test strip Use to check sugar once daily. Dx: E11.40; E11.65 11/09/15   Ria Bush, MD  Potassium Citrate 15 MEQ (1620 MG) TBCR 2 (two) times daily.  05/24/16   [provider]  rosuvastatin (CRESTOR) 5 MG tablet Take 1 tablet (5 mg total) by mouth once a week. 06/29/18   Ria Bush, MD  sitaGLIPtin-metformin (JANUMET) 50-1000 MG tablet Take 1 tablet by mouth 2 (two) times daily with a meal. 07/13/18   Ria Bush, MD  vitamin B-12 (V-R VITAMIN B-12) 500 MCG tablet Take  1 tablet (500 mcg total) by mouth daily. 06/29/18   Ria Bush, MD    Allergies Glipizide, Lipitor [atorvastatin], Lyrica [pregabalin], Naprosyn [naproxen], Pravastatin, and Codeine  Family History  Problem Relation Age of Onset  . Cancer Mother        lung (smoker)  . Diabetes Mother   . Cancer Maternal Aunt        lung (smoker)  . Cancer Maternal Aunt        lung with met to brain (smoker)  . Valvular heart disease Son 94       extra heart valves s/p recent procedure  . Hypertension Son   . Hyperlipidemia Son   . COPD Father   . Diabetes Sister   . Asthma Brother   . Stroke Neg Hx      Social History Social History   Tobacco Use  . Smoking status: Never Smoker  . Smokeless tobacco: Never Used  Substance Use Topics  . Alcohol use: No    Alcohol/week: 0.0 standard drinks  . Drug use: No      Review of Systems Constitutional: No fever/chills Eyes: No visual changes. ENT: No sore throat. Cardiovascular: Denies chest pain. Respiratory: Denies shortness of breath. Gastrointestinal: No abdominal pain.  No nausea, no vomiting.  No diarrhea.  No constipation. Genitourinary: Negative for dysuria. Musculoskeletal: Negative for back pain. Skin: Wound on the foot Neurological: Negative for headaches, focal weakness or numbness. All other ROS negative ____________________________________________   PHYSICAL EXAM:  VITAL SIGNS: ED Triage Vitals [01/23/19 1648]  Enc Vitals Group     BP (!) 159/78     Pulse Rate 71     Resp 20     Temp 98.9 F (37.2 C)     Temp Source Oral     SpO2 96 %     Weight 180 lb (81.6 kg)     Height 5' 6.5" (1.689 m)     Head Circumference      Peak Flow      Pain Score 6     Pain Loc      Pain Edu?      Excl. in [place]?     Constitutional: Alert and oriented. Well appearing and in no acute distress. Eyes: Conjunctivae are normal. EOMI. Head: Atraumatic. Nose: No congestion/rhinnorhea. Mouth/Throat: Mucous membranes are moist.   Neck: No stridor. Trachea Midline. FROM Cardiovascular: Normal rate, regular rhythm. Grossly normal heart sounds.  Good peripheral circulation. Respiratory: Normal respiratory effort.  No retractions. Lungs CTAB. Gastrointestinal: Soft and nontender. No distention. No abdominal bruits.  Musculoskeletal: R foot 1 cmx2cm area where the exterior layer of the skin has been torn off on the dorsum of the foot underneath the big toe notable mild swelling around the area.  No overt redness or warmth.  Full range of motion of all toes.  2+ distal pulses.  Neurologic:  Normal speech and language. No gross focal  neurologic deficits are appreciated.  Skin:  Skin is warm, dry and intact. No rash noted. Psychiatric: Mood and affect are normal. Speech and behavior are normal. GU: Deferred   ____________________________________________   LABS (all labs ordered are listed, but only abnormal results are displayed)  Labs Reviewed  CBC WITH DIFFERENTIAL/PLATELET - Abnormal; Notable for the following components:      Result Value   Hemoglobin 12.4 (*)    MCV 70.0 (*)    MCH 22.1 (*)    All other components within normal limits  COMPREHENSIVE METABOLIC PANEL -  Abnormal; Notable for the following components:   Glucose, Bld 142 (*)    All other components within normal limits   ____________________________________________ ____________________________________________  RADIOLOGY Robert Bellow, personally viewed and evaluated these images (plain radiographs) as part of my medical decision making, as well as reviewing the written report by the radiologist.  ED MD interpretation: X-ray without evidence of fracture.  Official radiology report(s): Dg Foot Complete Right  Result Date: 01/23/2019 CLINICAL DATA:  63 year old diabetic presenting with an abrasion on the bottom of the RIGHT foot. Patient does not recall a specific injury. EXAM: RIGHT FOOT COMPLETE - 3+ VIEW COMPARISON:  None. FINDINGS: No evidence of acute, subacute or healed fractures. Well-preserved bone mineral density. Well-preserved joint spaces. No radiographic evidence of osteomyelitis. Enthesopathic spur at the insertion of the Achilles tendon on the calcaneus. IMPRESSION: No acute or significant osseous abnormality. Electronically Signed   By: Evangeline Dakin M.D.   On: 01/23/2019 18:50    ____________________________________________   PROCEDURES  Procedure(s) performed (including Critical Care):  Procedures   ____________________________________________   INITIAL IMPRESSION / ASSESSMENT AND PLAN / ED COURSE  Joseph Bias was evaluated in Emergency Department on 01/23/2019 for the symptoms described in the history of present illness. He was evaluated in the context of the global COVID-19 pandemic, which necessitated consideration that the patient might be at risk for infection with the SARS-CoV-2 virus that causes COVID-19. Institutional protocols and algorithms that pertain to the evaluation of patients at risk for COVID-19 are in a state of rapid change based on information released by regulatory bodies including the CDC and federal and state organizations. These policies and algorithms were followed during the patient's care in the ED.    Patient most likely cut himself and tore a small piece of his skin off the bottom of his foot.  There is some minimal swelling.  Will get x-ray to evaluate for retained foreign body but does not appear to have anything on exam.  Will get x-ray to evaluate for fracture.  Will get labs to evaluate for infection.  No evidence of abscess.  Full range of motion of all joints therefore low suspicion for septic arthritis or crystal.  Does not look with a puncture wound.   Labs are reassuring with a normal white count therefore low suspicion for infection.  Sugar is 142. Xray negative.  Patient is diabetic with some minimal swelling around the area will give a short course of Keflex.  Wound was washed off and anti-bacterial ointment was placed on it and wrapped  I discussed the provisional nature of ED diagnosis, the treatment so far, the ongoing plan of care, follow up appointments and return precautions with the patient and any family or support people present. They expressed understanding and agreed with the plan, discharged home.   ____________________________________________   FINAL CLINICAL IMPRESSION(S) / ED DIAGNOSES   Final diagnoses:  Foot pain, right      MEDICATIONS GIVEN DURING THIS VISIT:  Medications - No data to display   ED Discharge Orders          Ordered    cephALEXin (KEFLEX) 250 MG capsule  4 times daily     01/23/19 1916           Note:  This document was prepared using Dragon voice recognition software and may include unintentional dictation errors.   Vanessa , MD 01/23/19 [phone]

## 2019-01-23 NOTE — ED Notes (Signed)
Patient declined discharge vital signs. 

## 2019-02-01 ENCOUNTER — Other Ambulatory Visit: Payer: Self-pay | Admitting: Family Medicine

## 2019-02-01 DIAGNOSIS — E1142 Type 2 diabetes mellitus with diabetic polyneuropathy: Secondary | ICD-10-CM | POA: Diagnosis not present

## 2019-02-01 DIAGNOSIS — L97511 Non-pressure chronic ulcer of other part of right foot limited to breakdown of skin: Secondary | ICD-10-CM | POA: Diagnosis not present

## 2019-02-01 NOTE — Telephone Encounter (Signed)
E-scribed refill.  Pls schedule virtual DM f/u with labs.

## 2019-02-01 NOTE — Telephone Encounter (Signed)
Called pt mailbox full will attempt again tomorrow

## 2019-02-02 NOTE — Telephone Encounter (Signed)
Pt scheduled for 02/15/19 for labs and MyChart visit on 02/16/19 @ 8am with Dr. Darnell Level.

## 2019-02-03 DIAGNOSIS — L97511 Non-pressure chronic ulcer of other part of right foot limited to breakdown of skin: Secondary | ICD-10-CM | POA: Diagnosis not present

## 2019-02-03 DIAGNOSIS — E1142 Type 2 diabetes mellitus with diabetic polyneuropathy: Secondary | ICD-10-CM | POA: Diagnosis not present

## 2019-02-10 DIAGNOSIS — L97511 Non-pressure chronic ulcer of other part of right foot limited to breakdown of skin: Secondary | ICD-10-CM | POA: Diagnosis not present

## 2019-02-10 DIAGNOSIS — E1142 Type 2 diabetes mellitus with diabetic polyneuropathy: Secondary | ICD-10-CM | POA: Diagnosis not present

## 2019-02-10 LAB — HM DIABETES FOOT EXAM

## 2019-02-14 ENCOUNTER — Other Ambulatory Visit: Payer: Self-pay | Admitting: Family Medicine

## 2019-02-14 DIAGNOSIS — K760 Fatty (change of) liver, not elsewhere classified: Secondary | ICD-10-CM

## 2019-02-14 DIAGNOSIS — E538 Deficiency of other specified B group vitamins: Secondary | ICD-10-CM

## 2019-02-14 DIAGNOSIS — Z125 Encounter for screening for malignant neoplasm of prostate: Secondary | ICD-10-CM

## 2019-02-14 DIAGNOSIS — D563 Thalassemia minor: Secondary | ICD-10-CM

## 2019-02-14 DIAGNOSIS — E1140 Type 2 diabetes mellitus with diabetic neuropathy, unspecified: Secondary | ICD-10-CM

## 2019-02-14 DIAGNOSIS — E785 Hyperlipidemia, unspecified: Secondary | ICD-10-CM

## 2019-02-14 DIAGNOSIS — Z85528 Personal history of other malignant neoplasm of kidney: Secondary | ICD-10-CM

## 2019-02-14 DIAGNOSIS — IMO0002 Reserved for concepts with insufficient information to code with codable children: Secondary | ICD-10-CM

## 2019-02-15 ENCOUNTER — Other Ambulatory Visit (INDEPENDENT_AMBULATORY_CARE_PROVIDER_SITE_OTHER): Payer: BC Managed Care – PPO

## 2019-02-15 ENCOUNTER — Other Ambulatory Visit: Payer: Self-pay

## 2019-02-15 DIAGNOSIS — E1165 Type 2 diabetes mellitus with hyperglycemia: Secondary | ICD-10-CM

## 2019-02-15 DIAGNOSIS — IMO0002 Reserved for concepts with insufficient information to code with codable children: Secondary | ICD-10-CM

## 2019-02-15 DIAGNOSIS — Z125 Encounter for screening for malignant neoplasm of prostate: Secondary | ICD-10-CM

## 2019-02-15 DIAGNOSIS — E1140 Type 2 diabetes mellitus with diabetic neuropathy, unspecified: Secondary | ICD-10-CM | POA: Diagnosis not present

## 2019-02-15 DIAGNOSIS — E538 Deficiency of other specified B group vitamins: Secondary | ICD-10-CM

## 2019-02-15 DIAGNOSIS — E785 Hyperlipidemia, unspecified: Secondary | ICD-10-CM

## 2019-02-15 LAB — PSA: PSA: 0.21 ng/mL (ref 0.10–4.00)

## 2019-02-15 LAB — LIPID PANEL
Cholesterol: 177 mg/dL (ref 0–200)
HDL: 48.5 mg/dL (ref >39.00)
LDL Cholesterol: 101 mg/dL — ABNORMAL HIGH (ref 0–99)
NonHDL: 128.33
Total CHOL/HDL Ratio: 4
Triglycerides: 135 mg/dL (ref 0.0–149.0)
VLDL: 27 mg/dL (ref 0.0–40.0)

## 2019-02-15 LAB — MICROALBUMIN / CREATININE URINE RATIO
Creatinine,U: 136.9 mg/dL
Microalb Creat Ratio: 1.5 mg/g (ref 0.0–30.0)
Microalb, Ur: 2.1 mg/dL — ABNORMAL HIGH (ref 0.0–1.9)

## 2019-02-15 LAB — HEMOGLOBIN A1C: Hgb A1c MFr Bld: 9 % — ABNORMAL HIGH (ref 4.6–6.5)

## 2019-02-15 LAB — VITAMIN B12: Vitamin B-12: 272 pg/mL (ref 211–911)

## 2019-02-16 ENCOUNTER — Other Ambulatory Visit: Payer: Self-pay

## 2019-02-16 ENCOUNTER — Encounter: Payer: Self-pay | Admitting: Family Medicine

## 2019-02-16 ENCOUNTER — Telehealth (INDEPENDENT_AMBULATORY_CARE_PROVIDER_SITE_OTHER): Payer: BC Managed Care – PPO | Admitting: Family Medicine

## 2019-02-16 VITALS — Ht 66.0 in | Wt 180.0 lb

## 2019-02-16 DIAGNOSIS — E1165 Type 2 diabetes mellitus with hyperglycemia: Secondary | ICD-10-CM

## 2019-02-16 DIAGNOSIS — E538 Deficiency of other specified B group vitamins: Secondary | ICD-10-CM | POA: Diagnosis not present

## 2019-02-16 DIAGNOSIS — E11621 Type 2 diabetes mellitus with foot ulcer: Secondary | ICD-10-CM

## 2019-02-16 DIAGNOSIS — L97509 Non-pressure chronic ulcer of other part of unspecified foot with unspecified severity: Secondary | ICD-10-CM

## 2019-02-16 DIAGNOSIS — IMO0002 Reserved for concepts with insufficient information to code with codable children: Secondary | ICD-10-CM

## 2019-02-16 DIAGNOSIS — E785 Hyperlipidemia, unspecified: Secondary | ICD-10-CM | POA: Diagnosis not present

## 2019-02-16 DIAGNOSIS — Z789 Other specified health status: Secondary | ICD-10-CM

## 2019-02-16 DIAGNOSIS — E1140 Type 2 diabetes mellitus with diabetic neuropathy, unspecified: Secondary | ICD-10-CM

## 2019-02-16 DIAGNOSIS — T466X5A Adverse effect of antihyperlipidemic and antiarteriosclerotic drugs, initial encounter: Secondary | ICD-10-CM | POA: Insufficient documentation

## 2019-02-16 HISTORY — DX: Type 2 diabetes mellitus with foot ulcer: E11.621

## 2019-02-16 MED ORDER — METFORMIN HCL 1000 MG PO TABS: 1000.0000 mg | ORAL_TABLET | Freq: Two times a day (BID) | ORAL | 3 refills | Status: DC

## 2019-02-16 MED ORDER — B-12 1000 MCG SL SUBL: 1.0000 | SUBLINGUAL_TABLET | Freq: Every day | SUBLINGUAL | Status: DC

## 2019-02-16 MED ORDER — OZEMPIC (0.25 OR 0.5 MG/DOSE) 2 MG/1.5ML ~~LOC~~ SOPN: PEN_INJECTOR | SUBCUTANEOUS | 3 refills | Status: AC

## 2019-02-16 NOTE — Assessment & Plan Note (Signed)
Restart vit B12 1066mcg daily.

## 2019-02-16 NOTE — Assessment & Plan Note (Signed)
Appreciate podiatry care ([facility]). Pt endorses healing well.

## 2019-02-16 NOTE — Assessment & Plan Note (Signed)
Chronic, off med. Statin intolerance.  The 10-year ASCVD risk score Mikey Bussing DC Brooke Bonito., et al., 2013) is: 20.8%   Values used to calculate the score:     Age: 63 years     Sex: Male     Is Non-Hispanic African American: No     Diabetic: Yes     Tobacco smoker: No     Systolic Blood Pressure: 981 mmHg     Is BP treated: No     HDL Cholesterol: 48.5 mg/dL     Total Cholesterol: 177 mg/dL

## 2019-02-16 NOTE — Assessment & Plan Note (Addendum)
A1c remains uncontrolled. Will transition off janumet onto plain metformin + ozempic. ozempic taper sent to pharmacy - pt will price out. No fmhx thyroid cancer. Pt agrees with plan. Reassess at 3 mo f/u visit. Pt declines DSME referral at this time due to financial concerns.

## 2019-02-16 NOTE — Progress Notes (Signed)
Virtual visit completed through [facility]. Due to national recommendations of social distancing due to COVID-19, a virtual visit is felt to be most appropriate for this patient at this time. Reviewed limitations of a virtual visit.   Patient location: home in his office Provider location: [place] [facility], office If any vitals were documented, they were collected by patient at home unless specified below.    Ht 5' 6" (1.676 m)   Wt 180 lb (81.6 kg)   BMI 29.05 kg/m    CC: DM f/u visit Subjective:    Patient ID: Blake Manning, male    DOB: [birth year], 63 y.o.   MRN: [id number]  HPI: Blake Manning is a 63 y.o. male presenting on 02/16/2019 for Diabetes (F/u.)   Wife currently laid off - some financial stressors.   Developed R diabetic foot ulcer s/p ER visit 01/2019. He saw podiatrist ([facility]). Has started using diabetic shoes.   DM - does not regularly check sugars. Compliant with antihyperglycemic regimen which includes: janumet 50/1000 bid. Denies low sugars or hypoglycemic symptoms. ++ paresthesias. Last diabetic eye exam: scheduled tomorrow. Pneumovax: 05/2014. Prevnar: not due. Glucometer brand: will check with insurance on preferred glucometer brand. DSME: never returned calls to schedule 2019.  Lab Results  Component Value Date   HGBA1C 9.0 (H) 02/15/2019   Diabetic Foot Exam - Simple   No data filed     Lab Results  Component Value Date   MICROALBUR 2.1 (H) 02/15/2019        Relevant past medical, surgical, family and social history reviewed and updated as indicated. Interim medical history since our last visit reviewed. Allergies and medications reviewed and updated. Outpatient Medications Prior to Visit  Medication Sig Dispense Refill  . aspirin (ASPIRIN EC) 81 MG EC tablet Take 1 tablet (81 mg total) by mouth daily. Swallow whole.    . gabapentin (NEURONTIN) 300 MG capsule TAKE 1 CAPSULE BY MOUTH EVERYDAY AT BEDTIME 90 capsule 1  . JANUMET 50-1000 MG  tablet TAKE 1 TABLET BY MOUTH TWICE DAILY WITH MEALS 180 tablet 0  . Blood Glucose Monitoring Suppl (BLOOD GLUCOSE METER) kit Accu-Chek Comfort Curve Dx: 250.00  Use to check sugar once daily 1 each 0  . glucose blood (ACCU-CHEK AVIVA PLUS) test strip Use to check sugar once daily. Dx: E11.40; E11.65 50 each 3  . Potassium Citrate 15 MEQ (1620 MG) TBCR 2 (two) times daily.   1  . rosuvastatin (CRESTOR) 5 MG tablet Take 1 tablet (5 mg total) by mouth once a week. 12 tablet 3  . vitamin B-12 (V-R VITAMIN B-12) 500 MCG tablet Take 1 tablet (500 mcg total) by mouth daily.     No facility-administered medications prior to visit.      Per HPI unless specifically indicated in ROS section below Review of Systems Objective:    Ht 5' 6" (1.676 m)   Wt 180 lb (81.6 kg)   BMI 29.05 kg/m   Wt Readings from Last 3 Encounters:  02/16/19 180 lb (81.6 kg)  01/23/19 180 lb (81.6 kg)  06/29/18 187 lb 8 oz (85 kg)     Physical exam: Gen: alert, NAD, not ill appearing Pulm: speaks in complete sentences without increased work of breathing Psych: normal mood, normal thought content      Results for orders placed or performed in visit on 02/16/19  HM DIABETES FOOT EXAM  Result Value Ref Range   HM Diabetic Foot Exam at podiatrist    Assessment &  Plan:   Problem List Items Addressed This Visit    Type 2 diabetes, uncontrolled, with neuropathy ([place]) - Primary    A1c remains uncontrolled. Will transition off janumet onto plain metformin + ozempic. ozempic taper sent to pharmacy - pt will price out. No fmhx thyroid cancer. Pt agrees with plan. Reassess at 3 mo f/u visit. Pt declines DSME referral at this time due to financial concerns.       Relevant Medications   Semaglutide,0.25 or 0.5MG/DOS, (OZEMPIC, 0.25 OR 0.5 MG/DOSE,) 2 MG/1.5ML SOPN   metFORMIN (GLUCOPHAGE) 1000 MG tablet   Statin intolerance   Low vitamin B12 level    Restart vit B12 1031mg daily.       Hyperlipidemia    Chronic,  off med. Statin intolerance.  The 10-year ASCVD risk score (Mikey BussingDC JBrooke Bonito, et al., 2013) is: 20.8%   Values used to calculate the score:     Age: 89years     Sex: Male     Is Non-Hispanic African American: No     Diabetic: Yes     Tobacco smoker: No     Systolic Blood Pressure: 1706mmHg     Is BP treated: No     HDL Cholesterol: 48.5 mg/dL     Total Cholesterol: 177 mg/dL       Diabetic foot ulcer associated with type 2 diabetes mellitus ([place]    Appreciate podiatry care ([facility]). Pt endorses healing well.       Relevant Medications   Semaglutide,0.25 or 0.5MG/DOS, (OZEMPIC, 0.25 OR 0.5 MG/DOSE,) 2 MG/1.5ML SOPN   metFORMIN (GLUCOPHAGE) 1000 MG tablet       Meds ordered this encounter  Medications  . Cyanocobalamin (B-12) 1000 MCG SUBL    Sig: Place 1 tablet under the tongue daily.  . Semaglutide,0.25 or 0.5MG/DOS, (OZEMPIC, 0.25 OR 0.5 MG/DOSE,) 2 MG/1.5ML SOPN    Sig: Inject 0.25 mg into the skin once a week for 14 days, THEN 0.5 mg once a week.    Dispense:  1 pen    Refill:  3  . metFORMIN (GLUCOPHAGE) 1000 MG tablet    Sig: Take 1 tablet (1,000 mg total) by mouth 2 (two) times daily with a meal.    Dispense:  180 tablet    Refill:  3    In place of janumet as starting ozempic   Orders Placed This Encounter  Procedures  . HM DIABETES FOOT EXAM    This external order was created through the Results Console.    I discussed the assessment and treatment plan with the patient. The patient was provided an opportunity to ask questions and all were answered. The patient agreed with the plan and demonstrated an understanding of the instructions. The patient was advised to call back or seek an in-person evaluation if the symptoms worsen or if the condition fails to improve as anticipated.  Follow up plan: Return in about 3 months (around 05/19/2019) for follow up visit.  JRia Bush MD

## 2019-02-17 LAB — HM DIABETES EYE EXAM

## 2019-02-17 MED ORDER — GLUCOSE BLOOD VI STRP: 1.0000 | ORAL_STRIP | 0 refills | Status: DC | PRN

## 2019-02-17 MED ORDER — ACCU-CHEK NANO SMARTVIEW W/DEVICE KIT: 1.0000 | PACK | 0 refills | Status: AC

## 2019-02-17 MED ORDER — ACCU-CHEK FASTCLIX LANCETS MISC: 1.0000 | 0 refills | Status: DC

## 2019-02-17 NOTE — Telephone Encounter (Signed)
E-scribed meter kit, test strips and lancets.  Fwd to Dr. Darnell Level for Trulicity rx.

## 2019-02-22 MED ORDER — TRULICITY 0.75 MG/0.5ML ~~LOC~~ SOAJ: 0.7500 mg | SUBCUTANEOUS | 1 refills | Status: DC

## 2019-02-22 NOTE — Addendum Note (Signed)
Addended by: Ria Bush on: 02/22/2019 08:01 AM   Modules accepted: Orders

## 2019-03-03 DIAGNOSIS — L97511 Non-pressure chronic ulcer of other part of right foot limited to breakdown of skin: Secondary | ICD-10-CM | POA: Diagnosis not present

## 2019-03-03 DIAGNOSIS — E1142 Type 2 diabetes mellitus with diabetic polyneuropathy: Secondary | ICD-10-CM | POA: Diagnosis not present

## 2019-03-05 ENCOUNTER — Encounter: Payer: Self-pay | Admitting: Family Medicine

## 2019-05-05 DIAGNOSIS — L97511 Non-pressure chronic ulcer of other part of right foot limited to breakdown of skin: Secondary | ICD-10-CM | POA: Diagnosis not present

## 2019-05-05 DIAGNOSIS — E1142 Type 2 diabetes mellitus with diabetic polyneuropathy: Secondary | ICD-10-CM | POA: Diagnosis not present

## 2019-05-12 ENCOUNTER — Other Ambulatory Visit: Payer: Self-pay | Admitting: Family Medicine

## 2019-05-18 ENCOUNTER — Ambulatory Visit (INDEPENDENT_AMBULATORY_CARE_PROVIDER_SITE_OTHER): Payer: BC Managed Care – PPO | Admitting: Family Medicine

## 2019-05-18 ENCOUNTER — Encounter: Payer: Self-pay | Admitting: Family Medicine

## 2019-05-18 ENCOUNTER — Other Ambulatory Visit: Payer: Self-pay

## 2019-05-18 VITALS — BP 140/78 | HR 68 | Temp 98.3°F | Ht 66.0 in | Wt 180.4 lb

## 2019-05-18 DIAGNOSIS — Z23 Encounter for immunization: Secondary | ICD-10-CM | POA: Diagnosis not present

## 2019-05-18 DIAGNOSIS — E1165 Type 2 diabetes mellitus with hyperglycemia: Secondary | ICD-10-CM | POA: Diagnosis not present

## 2019-05-18 DIAGNOSIS — E1140 Type 2 diabetes mellitus with diabetic neuropathy, unspecified: Secondary | ICD-10-CM | POA: Diagnosis not present

## 2019-05-18 DIAGNOSIS — E538 Deficiency of other specified B group vitamins: Secondary | ICD-10-CM

## 2019-05-18 DIAGNOSIS — IMO0002 Reserved for concepts with insufficient information to code with codable children: Secondary | ICD-10-CM

## 2019-05-18 LAB — POCT GLYCOSYLATED HEMOGLOBIN (HGB A1C): Hemoglobin A1C: 7.7 % — AB (ref 4.0–5.6)

## 2019-05-18 MED ORDER — OZEMPIC (0.25 OR 0.5 MG/DOSE) 2 MG/1.5ML ~~LOC~~ SOPN: 0.5000 mg | PEN_INJECTOR | SUBCUTANEOUS | 3 refills | Status: DC

## 2019-05-18 MED ORDER — GABAPENTIN 300 MG PO CAPS: ORAL_CAPSULE | ORAL | 3 refills | Status: DC

## 2019-05-18 NOTE — Patient Instructions (Addendum)
Flu shot today Good to see you today!  Congratulations on better sugar control. Continue current medicines.  Return next month for physical.

## 2019-05-18 NOTE — Progress Notes (Signed)
This visit was conducted in person.  BP 140/78 (BP Location: Left Arm, Patient Position: Sitting, Cuff Size: Normal)   Pulse 68   Temp 98.3 F (36.8 C) (Temporal)   Ht _0  (1.676 m)   Wt 180 lb 7 oz (81.8 kg)   SpO2 96%   BMI 29.12 kg/m    CC: DM f/u visit Subjective:    Patient ID: Blake Manning, male    DOB: [birth year], 63 y.o.   MRN: [id number]  HPI: Blake Manning is a 63 y.o. male presenting on 05/18/2019 for Diabetes (Here for 3 mo f/u.)   R diabetic foot ulcer s/p ER visit 01/2019, saw Dr Elvina Mattes, started using diabetic shoes. Foot has fully healed, saw podiatrist last week.   He is taking B12 1051mg daily.   DM - does regularly check sugars fasting today am 160 at lunch better controlled. Compliant with antihyperglycemic regimen which includes: ozempic 0.524mweekly and metformin 100035mid. Some nausea on first day of shot. Denies low sugars or hypoglycemic symptoms. Denies paresthesias. Last diabetic eye exam 02/2019. Pneumovax: 05/2014. Prevnar: note due. Glucometer brand: accuchek. DSME: declines due to financial concerns.  Lab Results  Component Value Date   HGBA1C 7.7 (A) 05/18/2019   Diabetic Foot Exam - Simple   No data filed     Lab Results  Component Value Date   MICROALBUR 2.1 (H) 02/15/2019        Relevant past medical, surgical, family and social history reviewed and updated as indicated. Interim medical history since our last visit reviewed. Allergies and medications reviewed and updated. Outpatient Medications Prior to Visit  Medication Sig Dispense Refill  . aspirin (ASPIRIN EC) 81 MG EC tablet Take 1 tablet (81 mg total) by mouth daily. Swallow whole.    . Cyanocobalamin (B-12) 1000 MCG SUBL Place 1 tablet under the tongue daily.    . metFORMIN (GLUCOPHAGE) 1000 MG tablet Take 1 tablet (1,000 mg total) by mouth 2 (two) times daily with a meal. 180 tablet 3  . Dulaglutide (TRULICITY) 0.78.89/VQ/9.4HWPN Inject 0.75 mg into the skin once a  week. 4 pen 1  . gabapentin (NEURONTIN) 300 MG capsule TAKE 1 CAPSULE BY MOUTH EVERYDAY AT BEDTIME 90 capsule 1  . Accu-Chek FastClix Lancets MISC 1 each by Does not apply route as directed. Use as directed to check blood sugar 102 each 0  . ACCU-CHEK GUIDE test strip 1 EACH BY OTHER ROUTE AS NEEDED. USE AS INSTRUCTED TO CHECK BLOOD SUGAR 100 strip 0  . Blood Glucose Monitoring Suppl (ACCU-CHEK NANO SMARTVIEW) w/Device KIT 1 each by Does not apply route as directed. Use as directed to check blood sugar 1 kit 0  . JANUMET 50-1000 MG tablet TAKE 1 TABLET BY MOUTH TWICE DAILY WITH MEALS 180 tablet 0   No facility-administered medications prior to visit.      Per HPI unless specifically indicated in ROS section below Review of Systems Objective:    BP 140/78 (BP Location: Left Arm, Patient Position: Sitting, Cuff Size: Normal)   Pulse 68   Temp 98.3 F (36.8 C) (Temporal)   Ht _1  (1.676 m)   Wt 180 lb 7 oz (81.8 kg)   SpO2 96%   BMI 29.12 kg/m   Wt Readings from Last 3 Encounters:  05/18/19 180 lb 7 oz (81.8 kg)  02/16/19 180 lb (81.6 kg)  01/23/19 180 lb (81.6 kg)    Physical Exam Vitals signs and nursing note reviewed.  Constitutional:  General: He is not in acute distress.    Appearance: Normal appearance. He is well-developed. He is not ill-appearing.  HENT:     Head: Normocephalic and atraumatic.     Right Ear: External ear normal.     Left Ear: External ear normal.     Nose: Nose normal.     Mouth/Throat:     Mouth: Mucous membranes are moist.     Pharynx: Oropharynx is clear. No posterior oropharyngeal erythema.  Eyes:     General: No scleral icterus.    Extraocular Movements: Extraocular movements intact.     Conjunctiva/sclera: Conjunctivae normal.     Pupils: Pupils are equal, round, and reactive to light.  Neck:     Musculoskeletal: Normal range of motion and neck supple.  Cardiovascular:     Rate and Rhythm: Normal rate and regular rhythm.     Heart  sounds: Normal heart sounds. No murmur.  Pulmonary:     Effort: Pulmonary effort is normal. No respiratory distress.     Breath sounds: Normal breath sounds. No wheezing or rales.  Musculoskeletal:     Comments: See HPI for foot exam if done  Lymphadenopathy:     Cervical: No cervical adenopathy.  Skin:    General: Skin is warm and dry.     Findings: No rash.  Neurological:     Mental Status: He is alert.  Psychiatric:        Mood and Affect: Mood normal.        Behavior: Behavior normal.       Results for orders placed or performed in visit on 05/18/19  POCT glycosylated hemoglobin (Hb A1C)  Result Value Ref Range   Hemoglobin A1C 7.7 (A) 4.0 - 5.6 %   HbA1c POC (<> result, manual entry)     HbA1c, POC (prediabetic range)     HbA1c, POC (controlled diabetic range)     Assessment & Plan:   Problem List Items Addressed This Visit    Type 2 diabetes, uncontrolled, with neuropathy ([place]) - Primary    Congratulated on improved sugar control - continue current regimen. Hesitant to increase ozempic dosing due to mild nausea on 0.43m weekly. Gabapentin refilled today.       Relevant Medications   Semaglutide,0.25 or 0.5MG/DOS, (OZEMPIC, 0.25 OR 0.5 MG/DOSE,) 2 MG/1.5ML SOPN   Other Relevant Orders   POCT glycosylated hemoglobin (Hb A1C) (Completed)   Low vitamin B12 level    Continues vit B12 daily dissolvable tablets.        Other Visit Diagnoses    Need for influenza vaccination       Relevant Orders   Flu Vaccine QUAD 36+ mos IM (Completed)       Meds ordered this encounter  Medications  . gabapentin (NEURONTIN) 300 MG capsule    Sig: TAKE 1 CAPSULE BY MOUTH EVERYDAY AT BEDTIME    Dispense:  90 capsule    Refill:  3  . Semaglutide,0.25 or 0.5MG/DOS, (OZEMPIC, 0.25 OR 0.5 MG/DOSE,) 2 MG/1.5ML SOPN    Sig: Inject 0.5 mg into the skin once a week.    Dispense:  3 pen    Refill:  3   Orders Placed This Encounter  Procedures  . Flu Vaccine QUAD 36+ mos IM  . POCT  glycosylated hemoglobin (Hb A1C)   Patient Instructions  Flu shot today Good to see you today!  Congratulations on better sugar control. Continue current medicines.  Return next month for physical.  Follow up plan: Return for annual exam, prior fasting for blood work.  Ria Bush, MD

## 2019-05-18 NOTE — Assessment & Plan Note (Addendum)
Congratulated on improved sugar control - continue current regimen. Hesitant to increase ozempic dosing due to mild nausea on 0.5mg  weekly. Gabapentin refilled today.

## 2019-05-18 NOTE — Assessment & Plan Note (Signed)
Continues vit B12 daily dissolvable tablets.

## 2019-06-21 ENCOUNTER — Telehealth: Payer: Self-pay

## 2019-06-21 NOTE — Telephone Encounter (Signed)
LVM w COVID screen, front door and back lab info 12.14.2020 TLJ

## 2019-06-22 ENCOUNTER — Other Ambulatory Visit: Payer: Self-pay | Admitting: Family Medicine

## 2019-06-22 DIAGNOSIS — E1169 Type 2 diabetes mellitus with other specified complication: Secondary | ICD-10-CM

## 2019-06-22 DIAGNOSIS — Z85528 Personal history of other malignant neoplasm of kidney: Secondary | ICD-10-CM

## 2019-06-22 DIAGNOSIS — E538 Deficiency of other specified B group vitamins: Secondary | ICD-10-CM

## 2019-06-22 DIAGNOSIS — Z125 Encounter for screening for malignant neoplasm of prostate: Secondary | ICD-10-CM

## 2019-06-22 DIAGNOSIS — E785 Hyperlipidemia, unspecified: Secondary | ICD-10-CM

## 2019-06-22 DIAGNOSIS — D563 Thalassemia minor: Secondary | ICD-10-CM

## 2019-06-22 DIAGNOSIS — IMO0002 Reserved for concepts with insufficient information to code with codable children: Secondary | ICD-10-CM

## 2019-06-22 DIAGNOSIS — E1140 Type 2 diabetes mellitus with diabetic neuropathy, unspecified: Secondary | ICD-10-CM

## 2019-06-22 DIAGNOSIS — K760 Fatty (change of) liver, not elsewhere classified: Secondary | ICD-10-CM

## 2019-06-23 ENCOUNTER — Other Ambulatory Visit: Payer: Self-pay

## 2019-06-23 ENCOUNTER — Other Ambulatory Visit (INDEPENDENT_AMBULATORY_CARE_PROVIDER_SITE_OTHER): Payer: BC Managed Care – PPO

## 2019-06-23 DIAGNOSIS — IMO0002 Reserved for concepts with insufficient information to code with codable children: Secondary | ICD-10-CM

## 2019-06-23 DIAGNOSIS — E538 Deficiency of other specified B group vitamins: Secondary | ICD-10-CM

## 2019-06-23 DIAGNOSIS — E1140 Type 2 diabetes mellitus with diabetic neuropathy, unspecified: Secondary | ICD-10-CM

## 2019-06-23 DIAGNOSIS — Z125 Encounter for screening for malignant neoplasm of prostate: Secondary | ICD-10-CM | POA: Diagnosis not present

## 2019-06-23 DIAGNOSIS — Z85528 Personal history of other malignant neoplasm of kidney: Secondary | ICD-10-CM | POA: Diagnosis not present

## 2019-06-23 DIAGNOSIS — D563 Thalassemia minor: Secondary | ICD-10-CM | POA: Diagnosis not present

## 2019-06-23 DIAGNOSIS — E1169 Type 2 diabetes mellitus with other specified complication: Secondary | ICD-10-CM | POA: Diagnosis not present

## 2019-06-23 DIAGNOSIS — E1165 Type 2 diabetes mellitus with hyperglycemia: Secondary | ICD-10-CM

## 2019-06-23 DIAGNOSIS — E785 Hyperlipidemia, unspecified: Secondary | ICD-10-CM | POA: Diagnosis not present

## 2019-06-23 LAB — COMPREHENSIVE METABOLIC PANEL
ALT: 35 U/L (ref 0–53)
AST: 22 U/L (ref 0–37)
Albumin: 4.5 g/dL (ref 3.5–5.2)
Alkaline Phosphatase: 63 U/L (ref 39–117)
BUN: 9 mg/dL (ref 6–23)
CO2: 27 mEq/L (ref 19–32)
Calcium: 9.6 mg/dL (ref 8.4–10.5)
Chloride: 104 mEq/L (ref 96–112)
Creatinine, Ser: 0.9 mg/dL (ref 0.40–1.50)
GFR: 85.73 mL/min (ref >60.00)
Glucose, Bld: 149 mg/dL — ABNORMAL HIGH (ref 70–99)
Potassium: 4.3 mEq/L (ref 3.5–5.1)
Sodium: 139 mEq/L (ref 135–145)
Total Bilirubin: 0.6 mg/dL (ref 0.2–1.2)
Total Protein: 7.2 g/dL (ref 6.0–8.3)

## 2019-06-23 LAB — LIPID PANEL
Cholesterol: 160 mg/dL (ref 0–200)
HDL: 51.6 mg/dL (ref >39.00)
LDL Cholesterol: 88 mg/dL (ref 0–99)
NonHDL: 108.32
Total CHOL/HDL Ratio: 3
Triglycerides: 103 mg/dL (ref 0.0–149.0)
VLDL: 20.6 mg/dL (ref 0.0–40.0)

## 2019-06-23 LAB — CBC WITH DIFFERENTIAL/PLATELET
Basophils Absolute: 0.1 10*3/uL (ref 0.0–0.1)
Basophils Relative: 1.1 % (ref 0.0–3.0)
Eosinophils Absolute: 0.1 10*3/uL (ref 0.0–0.7)
Eosinophils Relative: 1.6 % (ref 0.0–5.0)
HCT: 38.3 % — ABNORMAL LOW (ref 39.0–52.0)
Hemoglobin: 12.2 g/dL — ABNORMAL LOW (ref 13.0–17.0)
Lymphocytes Relative: 37.8 % (ref 12.0–46.0)
Lymphs Abs: 2.4 10*3/uL (ref 0.7–4.0)
MCHC: 31.8 g/dL (ref 30.0–36.0)
MCV: 70.9 fl — ABNORMAL LOW (ref 78.0–100.0)
Monocytes Absolute: 0.4 10*3/uL (ref 0.1–1.0)
Monocytes Relative: 6.8 % (ref 3.0–12.0)
Neutro Abs: 3.3 10*3/uL (ref 1.4–7.7)
Neutrophils Relative %: 52.7 % (ref 43.0–77.0)
Platelets: 253 10*3/uL (ref 150.0–400.0)
RBC: 5.41 Mil/uL (ref 4.22–5.81)
RDW: 15 % (ref 11.5–15.5)
WBC: 6.3 10*3/uL (ref 4.0–10.5)

## 2019-06-23 LAB — VITAMIN B12: Vitamin B-12: 729 pg/mL (ref 211–911)

## 2019-06-23 LAB — PSA: PSA: 0.19 ng/mL (ref 0.10–4.00)

## 2019-06-29 ENCOUNTER — Encounter: Payer: Self-pay | Admitting: Family Medicine

## 2019-06-29 ENCOUNTER — Ambulatory Visit (INDEPENDENT_AMBULATORY_CARE_PROVIDER_SITE_OTHER): Payer: BC Managed Care – PPO | Admitting: Family Medicine

## 2019-06-29 ENCOUNTER — Other Ambulatory Visit: Payer: Self-pay

## 2019-06-29 VITALS — BP 136/80 | HR 66 | Temp 98.3°F | Ht 66.0 in | Wt 178.1 lb

## 2019-06-29 DIAGNOSIS — E1169 Type 2 diabetes mellitus with other specified complication: Secondary | ICD-10-CM

## 2019-06-29 DIAGNOSIS — Z1211 Encounter for screening for malignant neoplasm of colon: Secondary | ICD-10-CM

## 2019-06-29 DIAGNOSIS — E538 Deficiency of other specified B group vitamins: Secondary | ICD-10-CM

## 2019-06-29 DIAGNOSIS — Z85528 Personal history of other malignant neoplasm of kidney: Secondary | ICD-10-CM

## 2019-06-29 DIAGNOSIS — D563 Thalassemia minor: Secondary | ICD-10-CM

## 2019-06-29 DIAGNOSIS — Z0000 Encounter for general adult medical examination without abnormal findings: Secondary | ICD-10-CM

## 2019-06-29 DIAGNOSIS — E1165 Type 2 diabetes mellitus with hyperglycemia: Secondary | ICD-10-CM

## 2019-06-29 DIAGNOSIS — Z7189 Other specified counseling: Secondary | ICD-10-CM | POA: Insufficient documentation

## 2019-06-29 DIAGNOSIS — E785 Hyperlipidemia, unspecified: Secondary | ICD-10-CM

## 2019-06-29 DIAGNOSIS — E11621 Type 2 diabetes mellitus with foot ulcer: Secondary | ICD-10-CM

## 2019-06-29 DIAGNOSIS — Z789 Other specified health status: Secondary | ICD-10-CM

## 2019-06-29 DIAGNOSIS — E1136 Type 2 diabetes mellitus with diabetic cataract: Secondary | ICD-10-CM

## 2019-06-29 DIAGNOSIS — IMO0002 Reserved for concepts with insufficient information to code with codable children: Secondary | ICD-10-CM

## 2019-06-29 DIAGNOSIS — E1140 Type 2 diabetes mellitus with diabetic neuropathy, unspecified: Secondary | ICD-10-CM

## 2019-06-29 DIAGNOSIS — L97509 Non-pressure chronic ulcer of other part of unspecified foot with unspecified severity: Secondary | ICD-10-CM

## 2019-06-29 DIAGNOSIS — K760 Fatty (change of) liver, not elsewhere classified: Secondary | ICD-10-CM

## 2019-06-29 LAB — FRUCTOSAMINE: Fructosamine: 318 umol/L — ABNORMAL HIGH (ref 205–285)

## 2019-06-29 MED ORDER — OZEMPIC (0.25 OR 0.5 MG/DOSE) 2 MG/1.5ML ~~LOC~~ SOPN: 0.5000 mg | PEN_INJECTOR | SUBCUTANEOUS | 3 refills | Status: DC

## 2019-06-29 MED ORDER — LOVASTATIN 20 MG PO TABS: 20.0000 mg | ORAL_TABLET | ORAL | 3 refills | Status: DC

## 2019-06-29 NOTE — Assessment & Plan Note (Signed)
LFTs normal

## 2019-06-29 NOTE — Assessment & Plan Note (Signed)
Levels improved on daily replacement - continue this.

## 2019-06-29 NOTE — Progress Notes (Addendum)
This visit was conducted in person.  BP 136/80 (BP Location: Left Arm, Patient Position: Sitting, Cuff Size: Normal)   Pulse 66   Temp 98.3 F (36.8 C) (Temporal)   Ht _0  (1.676 m)   Wt 178 lb 2 oz (80.8 kg)   SpO2 97%   BMI 28.75 kg/m    CC: CPE Subjective:    Patient ID: Blake Manning, male    DOB: [birth year], 63 y.o.   MRN: [id number]  HPI: Blake Manning is a 63 y.o. 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 Off potassium citrate for kidney stone history. No recent kidney stones.  DM - R diabetic foot ulcer treated by podiatry 01/2019 with resolution. On ozempic for the past year along with his metformin.   Preventative: Colonoscopy 2012@ [facility] told all normal -unable to receive records despite multiple attempts. iFOB normal 12/2017.  Prostate cancer screening - followed by urologist(Wolf)who he sees yearlyfor h/o kidney cancer.Mild enlargement. Flu shot - today Tetanus shot - 2011.  Pneumovax -2015. shingrix - discussed Advanced directive - has not completed. Would want wife Bethena Roys to be HCPOA. Will work on this, bring [place] copy when completed.  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 Has restarted playing tennis at [employer] court, neighborhood bracket Diet: good water and flavored water, fruits/vegetables daily - sugar free dessert      Relevant past medical, surgical, family and social history reviewed and updated as indicated. Interim medical history since our last visit reviewed. Allergies and medications reviewed and updated. Outpatient Medications Prior to Visit  Medication Sig Dispense Refill  . Accu-Chek FastClix Lancets MISC 1 each by Does not apply  route as directed. Use as directed to check blood sugar 102 each 0  . ACCU-CHEK GUIDE test strip 1 EACH BY OTHER ROUTE AS NEEDED. USE AS INSTRUCTED TO CHECK BLOOD SUGAR 100 strip 0  . aspirin (ASPIRIN EC) 81 MG EC tablet Take 1 tablet (81 mg total) by mouth daily. Swallow whole.    . Blood Glucose Monitoring Suppl (ACCU-CHEK NANO SMARTVIEW) w/Device KIT 1 each by Does not apply route as directed. Use as directed to check blood sugar 1 kit 0  . Cyanocobalamin (B-12) 1000 MCG SUBL Place 1 tablet under the tongue daily.    Marland Kitchen gabapentin (NEURONTIN) 300 MG capsule TAKE 1 CAPSULE BY MOUTH EVERYDAY AT BEDTIME 90 capsule 3  . metFORMIN (GLUCOPHAGE) 1000 MG tablet Take 1 tablet (1,000 mg total) by mouth 2 (two) times daily with a meal. 180 tablet 3  . Semaglutide,0.25 or 0.5MG/DOS, (OZEMPIC, 0.25 OR 0.5 MG/DOSE,) 2 MG/1.5ML SOPN Inject 0.5 mg into the skin once a week. 3 pen 3   No facility-administered medications prior to visit.     Per HPI unless specifically indicated in ROS section below Review of Systems  Constitutional: Negative for activity change, appetite change, chills, fatigue, fever and unexpected weight change.  HENT: Negative for hearing loss.   Eyes: Negative for visual disturbance.  Respiratory: Negative for cough, chest tightness, shortness of breath and wheezing.   Cardiovascular: Negative for chest pain, palpitations and leg swelling.  Gastrointestinal: Negative for abdominal distention, abdominal pain, blood in stool, constipation, diarrhea, nausea and  vomiting.  Genitourinary: Negative for difficulty urinating and hematuria.  Musculoskeletal: Negative for arthralgias, myalgias and neck pain.  Skin: Negative for rash.  Neurological: Negative for dizziness, seizures, syncope and headaches.  Hematological: Negative for adenopathy. Does not bruise/bleed easily.  Psychiatric/Behavioral: Negative for dysphoric mood. The patient is not nervous/anxious.    Objective:    BP 136/80  (BP Location: Left Arm, Patient Position: Sitting, Cuff Size: Normal)   Pulse 66   Temp 98.3 F (36.8 C) (Temporal)   Ht _0  (1.676 m)   Wt 178 lb 2 oz (80.8 kg)   SpO2 97%   BMI 28.75 kg/m   Wt Readings from Last 3 Encounters:  06/29/19 178 lb 2 oz (80.8 kg)  05/18/19 180 lb 7 oz (81.8 kg)  02/16/19 180 lb (81.6 kg)    Physical Exam Vitals and nursing note reviewed.  Constitutional:      General: He is not in acute distress.    Appearance: Normal appearance. He is well-developed. He is not ill-appearing.  HENT:     Head: Normocephalic and atraumatic.     Right Ear: Hearing, tympanic membrane, ear canal and external ear normal.     Left Ear: Hearing, tympanic membrane, ear canal and external ear normal.     Nose: Nose normal.     Mouth/Throat:     Pharynx: Uvula midline.  Eyes:     General: No scleral icterus.    Extraocular Movements: Extraocular movements intact.     Conjunctiva/sclera: Conjunctivae normal.     Pupils: Pupils are equal, round, and reactive to light.     Comments: Cataracts present bilaterally  Cardiovascular:     Rate and Rhythm: Normal rate and regular rhythm.     Pulses: Normal pulses.          Radial pulses are 2+ on the right side and 2+ on the left side.     Heart sounds: Normal heart sounds. No murmur.  Pulmonary:     Effort: Pulmonary effort is normal. No respiratory distress.     Breath sounds: Normal breath sounds. No wheezing, rhonchi or rales.  Abdominal:     General: Abdomen is flat. Bowel sounds are normal. There is no distension.     Palpations: Abdomen is soft. There is no mass.     Tenderness: There is no abdominal tenderness. There is no guarding or rebound.     Hernia: No hernia is present.  Musculoskeletal:        General: Normal range of motion.     Cervical back: Normal range of motion and neck supple.     Right lower leg: No edema.     Left lower leg: No edema.  Lymphadenopathy:     Cervical: No cervical adenopathy.   Skin:    General: Skin is warm and dry.     Findings: No rash.  Neurological:     General: No focal deficit present.     Mental Status: He is alert and oriented to person, place, and time.     Comments: CN grossly intact, station and gait intact  Psychiatric:        Mood and Affect: Mood normal.        Behavior: Behavior normal.        Thought Content: Thought content normal.        Judgment: Judgment normal.       Results for orders placed or performed in visit on 06/23/19  Fructosamine  Result Value Ref  Range   Fructosamine 318 (H) 205 - 285 umol/L  PSA  Result Value Ref Range   PSA 0.19 0.10 - 4.00 ng/mL  Vitamin B12  Result Value Ref Range   Vitamin B-12 729 211 - 911 pg/mL  CBC with Differential  Result Value Ref Range   WBC 6.3 4.0 - 10.5 K/uL   RBC 5.41 4.22 - 5.81 Mil/uL   Hemoglobin 12.2 (L) 13.0 - 17.0 g/dL   HCT 38.3 (L) 39.0 - 52.0 %   MCV 70.9 (L) 78.0 - 100.0 fl   MCHC 31.8 30.0 - 36.0 g/dL   RDW 15.0 11.5 - 15.5 %   Platelets 253.0 150.0 - 400.0 K/uL   Neutrophils Relative % 52.7 43.0 - 77.0 %   Lymphocytes Relative 37.8 12.0 - 46.0 %   Monocytes Relative 6.8 3.0 - 12.0 %   Eosinophils Relative 1.6 0.0 - 5.0 %   Basophils Relative 1.1 0.0 - 3.0 %   Neutro Abs 3.3 1.4 - 7.7 K/uL   Lymphs Abs 2.4 0.7 - 4.0 K/uL   Monocytes Absolute 0.4 0.1 - 1.0 K/uL   Eosinophils Absolute 0.1 0.0 - 0.7 K/uL   Basophils Absolute 0.1 0.0 - 0.1 K/uL  Comprehensive metabolic panel  Result Value Ref Range   Sodium 139 135 - 145 mEq/L   Potassium 4.3 3.5 - 5.1 mEq/L   Chloride 104 96 - 112 mEq/L   CO2 27 19 - 32 mEq/L   Glucose, Bld 149 (H) 70 - 99 mg/dL   BUN 9 6 - 23 mg/dL   Creatinine, Ser 0.90 0.40 - 1.50 mg/dL   Total Bilirubin 0.6 0.2 - 1.2 mg/dL   Alkaline Phosphatase 63 39 - 117 U/L   AST 22 0 - 37 U/L   ALT 35 0 - 53 U/L   Total Protein 7.2 6.0 - 8.3 g/dL   Albumin 4.5 3.5 - 5.2 g/dL   GFR 85.73 >60.00 mL/min   Calcium 9.6 8.4 - 10.5 mg/dL  Lipid panel   Result Value Ref Range   Cholesterol 160 0 - 200 mg/dL   Triglycerides 103.0 0.0 - 149.0 mg/dL   HDL 51.60 >39.00 mg/dL   VLDL 20.6 0.0 - 40.0 mg/dL   LDL Cholesterol 88 0 - 99 mg/dL   Total CHOL/HDL Ratio 3    NonHDL 108.32    Assessment & Plan:  This visit occurred during the SARS-CoV-2 public health emergency.  Safety protocols were in place, including screening questions prior to the visit, additional usage of staff PPE, and extensive cleaning of exam room while observing appropriate contact time as indicated for disinfecting solutions.   Problem List Items Addressed This Visit    Type 2 diabetes, uncontrolled, with neuropathy (HCC)    Chronic, overall improved. Reviewed fructosamine level. He is tolerating ozempic well. Continue this and metformin.       Relevant Medications   Semaglutide,0.25 or 0.5MG/DOS, (OZEMPIC, 0.25 OR 0.5 MG/DOSE,) 2 MG/1.5ML SOPN   lovastatin (MEVACOR) 20 MG tablet   Other Relevant Orders   POCT A1c   Thalassemia minor    Stable. Iron stores stable 2019.       Statin intolerance    Trial once weekly low potency lovastatin.      Low vitamin B12 level    Levels improved on daily replacement - continue this.       Hyperlipidemia associated with type 2 diabetes mellitus (HCC)    Chronic, previous statin intolerance. Agrees to trial lovastatin once weekly.  The 10-year ASCVD risk score Mikey Bussing DC Brooke Bonito., et al., 2013) is: 15.5%   Values used to calculate the score:     Age: 60 years     Sex: Male     Is Non-Hispanic African American: No     Diabetic: Yes     Tobacco smoker: No     Systolic Blood Pressure: 350 mmHg     Is BP treated: No     HDL Cholesterol: 51.6 mg/dL     Total Cholesterol: 160 mg/dL       Relevant Medications   Semaglutide,0.25 or 0.5MG/DOS, (OZEMPIC, 0.25 OR 0.5 MG/DOSE,) 2 MG/1.5ML SOPN   lovastatin (MEVACOR) 20 MG tablet   History of renal cell cancer    continue uro f/u yearly.      Health maintenance examination -  Primary    Preventative protocols reviewed and updated unless pt declined. Discussed healthy diet and lifestyle.       Fatty liver    LFTs normal.       RESOLVED: Diabetic foot ulcer associated with type 2 diabetes mellitus ([place])    Resolved.       Relevant Medications   Semaglutide,0.25 or 0.5MG/DOS, (OZEMPIC, 0.25 OR 0.5 MG/DOSE,) 2 MG/1.5ML SOPN   lovastatin (MEVACOR) 20 MG tablet   Diabetic cataract, associated with type 2 diabetes mellitus (HCC)   Relevant Medications   Semaglutide,0.25 or 0.5MG/DOS, (OZEMPIC, 0.25 OR 0.5 MG/DOSE,) 2 MG/1.5ML SOPN   lovastatin (MEVACOR) 20 MG tablet   Advanced care planning/counseling discussion    Advanced directive - has not updated. Would want wife Bethena Roys to be HCPOA. Will work on this, bring [place] copy when completed. Advanced directive packet provided today.        Other Visit Diagnoses    Special screening for malignant neoplasms, colon       Relevant Orders   Fecal occult blood, imunochemical       Meds ordered this encounter  Medications  . Semaglutide,0.25 or 0.5MG/DOS, (OZEMPIC, 0.25 OR 0.5 MG/DOSE,) 2 MG/1.5ML SOPN    Sig: Inject 0.5 mg into the skin once a week.    Dispense:  3 pen    Refill:  3  . lovastatin (MEVACOR) 20 MG tablet    Sig: Take 1 tablet (20 mg total) by mouth once a week.    Dispense:  13 tablet    Refill:  3   Orders Placed This Encounter  Procedures  . Fecal occult blood, imunochemical    Standing Status:   Future    Standing Expiration Date:   06/28/2020  . POCT A1c    Standing Status:   Future    Standing Expiration Date:   08/30/2019    Patient instructions: Pass by lab to pick up stool kit.  Check with insurance on cost for shingrix for you - 2 shot series. Then either get at local pharmacy or schedule nurse visit here for series.  Advanced directive provided today.  You are doing great today! Keep up activity. Return as needed or in 6 months for diabetes follow up visit.  Return in 2 months  for POC A1c.   Follow up plan: Return if symptoms worsen or fail to improve, for annual exam, prior fasting for blood work.  Ria Bush, MD

## 2019-06-29 NOTE — Patient Instructions (Addendum)
Pass by lab to pick up stool kit.  Check with insurance on cost for shingrix for you - 2 shot series. Then either get at local pharmacy or schedule nurse visit here for series.  Advanced directive provided today.  You are doing great today! Keep up activity. Return as needed or in 6 months for diabetes follow up visit.  Return in 2 months for POC A1c.   Health Maintenance, Male Adopting a healthy lifestyle and getting preventive care are important in promoting health and wellness. Ask your health care provider about:  The right schedule for you to have regular tests and exams.  Things you can do on your own to prevent diseases and keep yourself healthy. What should I know about diet, weight, and exercise? Eat a healthy diet   Eat a diet that includes plenty of vegetables, fruits, low-fat dairy products, and lean protein.  Do not eat a lot of foods that are high in solid fats, added sugars, or sodium. Maintain a healthy weight Body mass index (BMI) is a measurement that can be used to identify possible weight problems. It estimates body fat based on height and weight. Your health care provider can help determine your BMI and help you achieve or maintain a healthy weight. Get regular exercise Get regular exercise. This is one of the most important things you can do for your health. Most adults should:  Exercise for at least 150 minutes each week. The exercise should increase your heart rate and make you sweat (moderate-intensity exercise).  Do strengthening exercises at least twice a week. This is in addition to the moderate-intensity exercise.  Spend less time sitting. Even light physical activity can be beneficial. Watch cholesterol and blood lipids Have your blood tested for lipids and cholesterol at 61 years of age, then have this test every 5 years. You may need to have your cholesterol levels checked more often if:  Your lipid or cholesterol levels are high.  You are older  than 61 years of age.  You are at high risk for heart disease. What should I know about cancer screening? Many types of cancers can be detected early and may often be prevented. Depending on your health history and family history, you may need to have cancer screening at various ages. This may include screening for:  Colorectal cancer.  Prostate cancer.  Skin cancer.  Lung cancer. What should I know about heart disease, diabetes, and high blood pressure? Blood pressure and heart disease  High blood pressure causes heart disease and increases the risk of stroke. This is more likely to develop in people who have high blood pressure readings, are of African descent, or are overweight.  Talk with your health care provider about your target blood pressure readings.  Have your blood pressure checked: ? Every 3-5 years if you are 83-8 years of age. ? Every year if you are 42 years old or older.  If you are between the ages of 41 and 87 and are a current or former smoker, ask your health care provider if you should have a one-time screening for abdominal aortic aneurysm (AAA). Diabetes Have regular diabetes screenings. This checks your fasting blood sugar level. Have the screening done:  Once every three years after age 46 if you are at a normal weight and have a low risk for diabetes.  More often and at a younger age if you are overweight or have a high risk for diabetes. What should I know about preventing  infection? Hepatitis B If you have a higher risk for hepatitis B, you should be screened for this virus. Talk with your health care provider to find out if you are at risk for hepatitis B infection. Hepatitis C Blood testing is recommended for:  Everyone born from [birth year] through 1963.  Anyone with known risk factors for hepatitis C. Sexually transmitted infections (STIs)  You should be screened each year for STIs, including gonorrhea and chlamydia, if: ? You are sexually active  and are younger than 61 years of age. ? You are older than 61 years of age and your health care provider tells you that you are at risk for this type of infection. ? Your sexual activity has changed since you were last screened, and you are at increased risk for chlamydia or gonorrhea. Ask your health care provider if you are at risk.  Ask your health care provider about whether you are at high risk for HIV. Your health care provider may recommend a prescription medicine to help prevent HIV infection. If you choose to take medicine to prevent HIV, you should first get tested for HIV. You should then be tested every 3 months for as long as you are taking the medicine. Follow these instructions at home: Lifestyle  Do not use any products that contain nicotine or tobacco, such as cigarettes, e-cigarettes, and chewing tobacco. If you need help quitting, ask your health care provider.  Do not use street drugs.  Do not share needles.  Ask your health care provider for help if you need support or information about quitting drugs. Alcohol use  Do not drink alcohol if your health care provider tells you not to drink.  If you drink alcohol: ? Limit how much you have to 0-2 drinks a day. ? Be aware of how much alcohol is in your drink. In the [place] one drink equals one 12 oz bottle of beer (355 mL), one 5 oz glass of wine (148 mL), or one 1 oz glass of hard liquor (44 mL). General instructions  Schedule regular health, dental, and eye exams.  Stay current with your vaccines.  Tell your health care provider if: ? You often feel depressed. ? You have ever been abused or do not feel safe at home. Summary  Adopting a healthy lifestyle and getting preventive care are important in promoting health and wellness.  Follow your health care provider's instructions about healthy diet, exercising, and getting tested or screened for diseases.  Follow your health care provider's instructions on  monitoring your cholesterol and blood pressure. This information is not intended to replace advice given to you by your health care provider. Make sure you discuss any questions you have with your health care provider. Document Released: 12/21/2007 Document Revised: 06/17/2018 Document Reviewed: 06/17/2018 Elsevier Patient Education  2020 [employer].

## 2019-06-29 NOTE — Assessment & Plan Note (Signed)
Preventative protocols reviewed and updated unless pt declined. Discussed healthy diet and lifestyle.  

## 2019-06-29 NOTE — Assessment & Plan Note (Signed)
Chronic, overall improved. Reviewed fructosamine level. He is tolerating ozempic well. Continue this and metformin.

## 2019-06-29 NOTE — Assessment & Plan Note (Signed)
continue uro f/u yearly.

## 2019-06-29 NOTE — Assessment & Plan Note (Signed)
Chronic, previous statin intolerance. Agrees to trial lovastatin once weekly.  The 10-year ASCVD risk score Mikey Bussing DC Brooke Bonito., et al., 2013) is: 15.5%   Values used to calculate the score:     Age: 63 years     Sex: Male     Is Non-Hispanic African American: No     Diabetic: Yes     Tobacco smoker: No     Systolic Blood Pressure: [id number] mmHg     Is BP treated: No     HDL Cholesterol: 51.6 mg/dL     Total Cholesterol: 160 mg/dL

## 2019-06-29 NOTE — Assessment & Plan Note (Signed)
Trial once weekly low potency lovastatin.

## 2019-06-29 NOTE — Assessment & Plan Note (Addendum)
Stable. Iron stores stable 2019.

## 2019-06-29 NOTE — Assessment & Plan Note (Signed)
Resolved

## 2019-06-29 NOTE — Assessment & Plan Note (Addendum)
Advanced directive - has not updated. Would want wife Bethena Roys to be HCPOA. Will work on this, bring [place] copy when completed. Advanced directive packet provided today.

## 2019-06-30 DIAGNOSIS — E1136 Type 2 diabetes mellitus with diabetic cataract: Secondary | ICD-10-CM | POA: Insufficient documentation

## 2019-08-11 ENCOUNTER — Other Ambulatory Visit: Payer: Self-pay | Admitting: Family Medicine

## 2019-09-01 ENCOUNTER — Other Ambulatory Visit: Payer: BC Managed Care – PPO

## 2019-11-03 DIAGNOSIS — E1142 Type 2 diabetes mellitus with diabetic polyneuropathy: Secondary | ICD-10-CM | POA: Diagnosis not present

## 2019-11-03 DIAGNOSIS — L600 Ingrowing nail: Secondary | ICD-10-CM | POA: Diagnosis not present

## 2019-11-03 DIAGNOSIS — L851 Acquired keratosis [keratoderma] palmaris et plantaris: Secondary | ICD-10-CM | POA: Diagnosis not present

## 2019-12-24 ENCOUNTER — Encounter: Payer: Self-pay | Admitting: Family Medicine

## 2019-12-24 NOTE — Telephone Encounter (Signed)
I left a detailed message on patient's voice mail that his appointment was cancelled and to call back to reschedule appointment.

## 2019-12-28 ENCOUNTER — Ambulatory Visit: Payer: BC Managed Care – PPO | Admitting: Family Medicine

## 2020-01-29 ENCOUNTER — Other Ambulatory Visit: Payer: Self-pay | Admitting: Family Medicine

## 2020-03-08 ENCOUNTER — Encounter: Payer: Self-pay | Admitting: Family Medicine

## 2020-03-08 DIAGNOSIS — H9319 Tinnitus, unspecified ear: Secondary | ICD-10-CM

## 2020-03-20 ENCOUNTER — Other Ambulatory Visit: Payer: Self-pay | Admitting: Family Medicine

## 2020-03-27 ENCOUNTER — Other Ambulatory Visit: Payer: Self-pay | Admitting: Physician Assistant

## 2020-03-27 DIAGNOSIS — H903 Sensorineural hearing loss, bilateral: Secondary | ICD-10-CM | POA: Diagnosis not present

## 2020-03-27 DIAGNOSIS — H93A9 Pulsatile tinnitus, unspecified ear: Secondary | ICD-10-CM | POA: Diagnosis not present

## 2020-03-27 DIAGNOSIS — H6122 Impacted cerumen, left ear: Secondary | ICD-10-CM | POA: Diagnosis not present

## 2020-04-03 ENCOUNTER — Ambulatory Visit: Payer: Self-pay

## 2020-04-03 ENCOUNTER — Other Ambulatory Visit: Payer: Self-pay

## 2020-04-24 ENCOUNTER — Ambulatory Visit
Admission: RE | Admit: 2020-04-24 | Discharge: 2020-04-24 | Disposition: A | Discharge status: Home / Self Care | Payer: BC Managed Care – PPO | Source: Ambulatory Visit | Attending: Physician Assistant | Admitting: Physician Assistant

## 2020-04-24 ENCOUNTER — Other Ambulatory Visit: Payer: Self-pay

## 2020-04-24 ENCOUNTER — Ambulatory Visit: Payer: BC Managed Care – PPO

## 2020-04-24 ENCOUNTER — Other Ambulatory Visit
Admission: RE | Admit: 2020-04-24 | Discharge: 2020-04-24 | Disposition: A | Discharge status: Home / Self Care | Payer: BC Managed Care – PPO | Source: Home / Self Care | Attending: Physician Assistant | Admitting: Physician Assistant

## 2020-04-24 DIAGNOSIS — H93A9 Pulsatile tinnitus, unspecified ear: Secondary | ICD-10-CM | POA: Insufficient documentation

## 2020-04-24 DIAGNOSIS — H93A3 Pulsatile tinnitus, bilateral: Secondary | ICD-10-CM | POA: Diagnosis not present

## 2020-04-24 LAB — CREATININE, SERUM
Creatinine, Ser: 0.91 mg/dL (ref 0.61–1.24)
GFR, Estimated: >60 mL/min (ref >60)

## 2020-04-24 LAB — BUN: BUN: 12 mg/dL (ref 8–23)

## 2020-04-24 MED ORDER — IOHEXOL 350 MG/ML SOLN
75.0000 mL | Freq: Once | INTRAVENOUS | Status: AC | PRN
  Administered 2020-04-24: 75 mL via INTRAVENOUS

## 2020-04-26 ENCOUNTER — Ambulatory Visit (INDEPENDENT_AMBULATORY_CARE_PROVIDER_SITE_OTHER): Payer: BC Managed Care – PPO | Admitting: Family Medicine

## 2020-04-26 ENCOUNTER — Encounter: Payer: Self-pay | Admitting: Family Medicine

## 2020-04-26 ENCOUNTER — Other Ambulatory Visit: Payer: Self-pay

## 2020-04-26 VITALS — BP 140/78 | HR 62 | Temp 98.1°F | Ht 66.0 in | Wt 179.4 lb

## 2020-04-26 DIAGNOSIS — Z789 Other specified health status: Secondary | ICD-10-CM | POA: Diagnosis not present

## 2020-04-26 DIAGNOSIS — E1169 Type 2 diabetes mellitus with other specified complication: Secondary | ICD-10-CM | POA: Diagnosis not present

## 2020-04-26 DIAGNOSIS — H93A3 Pulsatile tinnitus, bilateral: Secondary | ICD-10-CM

## 2020-04-26 DIAGNOSIS — E1165 Type 2 diabetes mellitus with hyperglycemia: Secondary | ICD-10-CM

## 2020-04-26 DIAGNOSIS — Z23 Encounter for immunization: Secondary | ICD-10-CM | POA: Diagnosis not present

## 2020-04-26 DIAGNOSIS — E1140 Type 2 diabetes mellitus with diabetic neuropathy, unspecified: Secondary | ICD-10-CM | POA: Diagnosis not present

## 2020-04-26 DIAGNOSIS — E785 Hyperlipidemia, unspecified: Secondary | ICD-10-CM

## 2020-04-26 DIAGNOSIS — IMO0002 Reserved for concepts with insufficient information to code with codable children: Secondary | ICD-10-CM

## 2020-04-26 LAB — POCT GLYCOSYLATED HEMOGLOBIN (HGB A1C): Hemoglobin A1C: 7.5 % — AB (ref 4.0–5.6)

## 2020-04-26 MED ORDER — ACCU-CHEK FASTCLIX LANCETS MISC
1.0000 | 3 refills | Status: DC
Start: 2020-04-26 — End: 2023-01-14

## 2020-04-26 NOTE — Progress Notes (Signed)
This visit was conducted in person.  BP 140/78 (BP Location: Left Arm, Patient Position: Sitting, Cuff Size: Normal)   Pulse 62   Temp 98.1 F (36.7 C) (Temporal)   Ht 5\' 6"  (1.676 m)   Wt 179 lb 7 oz (81.4 kg)   SpO2 98%   BMI 28.96 kg/m   BP Readings from Last 3 Encounters:  04/26/20 140/78  06/29/19 136/80  05/18/19 140/78   CC: DM f/u visit  Subjective:    Patient ID: Blake Manning, male    DOB: [birth year], 64 y.o.   MRN: [id number]  HPI: Blake Manning is a 64 y.o. male presenting on 04/26/2020 for Diabetes (Here for f/u.)   Now working form home, no longer driving - healthier diets.   Pulsatile tinnitus - saw ENT s/p reassuringly normal CTA head and neck. Passed hearing test.   HLD - unable to tolerate lovastatin weekly due to diarrhea and nausea. Previously did not tolerate atorvastatin, pravastatin, rosuvastatin.   DM - does not regularly check sugars nonfasting this morning 180. Compliant with antihyperglycemic regimen which includes: metformin 1000mg  bid and ozempic 0.5mg  weekly. Thinks could do better with diet. Denies low sugars or hypoglycemic symptoms. Known neuropathy managed with gabapentin 300mg  nightly. Last diabetic eye exam DUE. Pneumovax: 2015. Prevnar: not due. Glucometer brand: accuchek. DSME: DUE. H/o R diabetic foot ulcer 01/2019 s/p full resolution. Upcoming podiatry appt next week.  Lab Results  Component Value Date   HGBA1C 7.5 (A) 04/26/2020   Diabetic Foot Exam - Simple   Simple Foot Form Diabetic Foot exam was performed with the following findings: Yes 04/26/2020  9:18 AM  Visual Inspection No deformities, no ulcerations, no other skin breakdown bilaterally: Yes Sensation Testing Intact to touch and monofilament testing bilaterally: Yes Pulse Check Posterior Tibialis and Dorsalis pulse intact bilaterally: Yes Comments    Lab Results  Component Value Date   MICROALBUR 2.1 (H) 02/15/2019         Relevant past medical, surgical,  family and social history reviewed and updated as indicated. Interim medical history since our last visit reviewed. Allergies and medications reviewed and updated. Outpatient Medications Prior to Visit  Medication Sig Dispense Refill  . aspirin (ASPIRIN EC) 81 MG EC tablet Take 1 tablet (81 mg total) by mouth daily. Swallow whole.    . Blood Glucose Monitoring Suppl (ACCU-CHEK NANO SMARTVIEW) w/Device KIT 1 each by Does not apply route as directed. Use as directed to check blood sugar 1 kit 0  . Cyanocobalamin (B-12) 1000 MCG SUBL Place 1 tablet under the tongue daily.    Marland Kitchen gabapentin (NEURONTIN) 300 MG capsule TAKE 1 CAPSULE BY MOUTH EVERYDAY AT BEDTIME 90 capsule 3  . glucose blood (ACCU-CHEK GUIDE) test strip Check sugar once daily. DX E11.40 100 strip 3  . metFORMIN (GLUCOPHAGE) 1000 MG tablet TAKE 1 TABLET (1,000 MG TOTAL) BY MOUTH 2 (TWO) TIMES DAILY WITH A MEAL. 180 tablet 1  . Semaglutide,0.25 or 0.5MG /DOS, (OZEMPIC, 0.25 OR 0.5 MG/DOSE,) 2 MG/1.5ML SOPN Inject 0.5 mg into the skin once a week. 3 pen 3  . Accu-Chek FastClix Lancets MISC 1 each by Does not apply route as directed. Use as directed to check blood sugar 102 each 0  . lovastatin (MEVACOR) 20 MG tablet Take 1 tablet (20 mg total) by mouth once a week. (Patient not taking: Reported on 04/26/2020) 13 tablet 3   No facility-administered medications prior to visit.     Per HPI unless specifically indicated in ROS  section below Review of Systems Objective:  BP 140/78 (BP Location: Left Arm, Patient Position: Sitting, Cuff Size: Normal)   Pulse 62   Temp 98.1 F (36.7 C) (Temporal)   Ht $R'5\' 6"'XD$  (1.676 m)   Wt 179 lb 7 oz (81.4 kg)   SpO2 98%   BMI 28.96 kg/m   Wt Readings from Last 3 Encounters:  04/26/20 179 lb 7 oz (81.4 kg)  06/29/19 178 lb 2 oz (80.8 kg)  05/18/19 180 lb 7 oz (81.8 kg)      Physical Exam Vitals and nursing note reviewed.  Constitutional:      General: He is not in acute distress.    Appearance:  Normal appearance. He is well-developed. He is not ill-appearing.  HENT:     Head: Normocephalic and atraumatic.     Right Ear: External ear normal.     Left Ear: External ear normal.     Nose: Nose normal.     Mouth/Throat:     Pharynx: No oropharyngeal exudate.  Eyes:     General: No scleral icterus.    Conjunctiva/sclera: Conjunctivae normal.     Pupils: Pupils are equal, round, and reactive to light.  Cardiovascular:     Rate and Rhythm: Normal rate and regular rhythm.     Heart sounds: Normal heart sounds. No murmur heard.   Pulmonary:     Effort: Pulmonary effort is normal. No respiratory distress.     Breath sounds: Normal breath sounds. No wheezing or rales.  Musculoskeletal:     Cervical back: Normal range of motion and neck supple.     Comments: See HPI for foot exam if done  Lymphadenopathy:     Cervical: No cervical adenopathy.  Skin:    General: Skin is warm and dry.     Findings: No rash.  Neurological:     Mental Status: He is alert.       Results for orders placed or performed in visit on 04/26/20  POCT glycosylated hemoglobin (Hb A1C)  Result Value Ref Range   Hemoglobin A1C 7.5 (A) 4.0 - 5.6 %   HbA1c POC (<> result, manual entry)     HbA1c, POC (prediabetic range)     HbA1c, POC (controlled diabetic range)     Lab Results  Component Value Date   CREATININE 0.91 04/24/2020   BUN 12 04/24/2020   NA 139 06/23/2019   K 4.3 06/23/2019   CL 104 06/23/2019   CO2 27 06/23/2019    Lab Results  Component Value Date   CHOL 160 06/23/2019   HDL 51.60 06/23/2019   LDLCALC 88 06/23/2019   LDLDIRECT 104.0 12/19/2017   TRIG 103.0 06/23/2019   CHOLHDL 3 06/23/2019    Assessment & Plan:  This visit occurred during the SARS-CoV-2 public health emergency.  Safety protocols were in place, including screening questions prior to the visit, additional usage of staff PPE, and extensive cleaning of exam room while observing appropriate contact time as indicated for  disinfecting solutions.   Problem List Items Addressed This Visit    Type 2 diabetes, uncontrolled, with neuropathy ([place]) - Primary    Chronic, stable but not optimal with A1c 7.5%. encouraged renewed efforts at following low sugar low carb diabetic diet.  Continue metformin and ozempic.  He will call to schedule eye exam.       Relevant Orders   POCT glycosylated hemoglobin (Hb A1C) (Completed)   Statin intolerance   Pulsatile tinnitus of both ears  Appreciate ENT/audiology eval. Recent CTA head/neck returned normal      Hyperlipidemia associated with type 2 diabetes mellitus (HCC)    Statin intolerance - even weekly lovastatin.  Rpt FLP at CPE and discussed considering zetia.  The 10-year ASCVD risk score Mikey Bussing DC Brooke Bonito., et al., 2013) is: 17.7%   Values used to calculate the score:     Age: 13 years     Sex: Male     Is Non-Hispanic African American: No     Diabetic: Yes     Tobacco smoker: No     Systolic Blood Pressure: 100 mmHg     Is BP treated: No     HDL Cholesterol: 51.6 mg/dL     Total Cholesterol: 160 mg/dL        Other Visit Diagnoses    Need for influenza vaccination       Relevant Orders   Flu Vaccine QUAD 36+ mos IM (Completed)       Meds ordered this encounter  Medications  . Accu-Chek FastClix Lancets MISC    Sig: 1 each by Does not apply route as directed. Use as directed to check blood sugar    Dispense:  102 each    Refill:  3   Orders Placed This Encounter  Procedures  . Flu Vaccine QUAD 36+ mos IM  . POCT glycosylated hemoglobin (Hb A1C)    Patient instructions: Flu shot today  Renew efforts at low sugar low carb diabetic diet.  Schedule eye exam - ask him to send me report.  Keep physical appointment at end of year - we will check labwork at that time.  We may consider cholesterol (zetia) and blood pressure (lisinopril) medicines at follow up depending on results.   Follow up plan: Return if symptoms worsen or fail to  improve.  Ria Bush, MD

## 2020-04-26 NOTE — Patient Instructions (Addendum)
Flu shot today  Renew efforts at low sugar low carb diabetic diet.  Schedule eye exam - ask him to send me report.  Keep physical appointment at end of year - we will check labwork at that time.  We may consider cholesterol (zetia) and blood pressure (lisinopril) medicines at follow up depending on results.   Diabetes Mellitus and Nutrition, Adult When you have diabetes (diabetes mellitus), it is very important to have healthy eating habits because your blood sugar (glucose) levels are greatly affected by what you eat and drink. Eating healthy foods in the appropriate amounts, at about the same times every day, can help you:  Control your blood glucose.  Lower your risk of heart disease.  Improve your blood pressure.  Reach or maintain a healthy weight. Every person with diabetes is different, and each person has different needs for a meal plan. Your health care provider may recommend that you work with a diet and nutrition specialist (dietitian) to make a meal plan that is best for you. Your meal plan may vary depending on factors such as:  The calories you need.  The medicines you take.  Your weight.  Your blood glucose, blood pressure, and cholesterol levels.  Your activity level.  Other health conditions you have, such as heart or kidney disease. How do carbohydrates affect me? Carbohydrates, also called carbs, affect your blood glucose level more than any other type of food. Eating carbs naturally raises the amount of glucose in your blood. Carb counting is a method for keeping track of how many carbs you eat. Counting carbs is important to keep your blood glucose at a healthy level, especially if you use insulin or take certain oral diabetes medicines. It is important to know how many carbs you can safely have in each meal. This is different for every person. Your dietitian can help you calculate how many carbs you should have at each meal and for each snack. Foods that contain  carbs include:  Bread, cereal, rice, pasta, and crackers.  Potatoes and corn.  Peas, beans, and lentils.  Milk and yogurt.  Fruit and juice.  Desserts, such as cakes, cookies, ice cream, and candy. How does alcohol affect me? Alcohol can cause a sudden decrease in blood glucose (hypoglycemia), especially if you use insulin or take certain oral diabetes medicines. Hypoglycemia can be a life-threatening condition. Symptoms of hypoglycemia (sleepiness, dizziness, and confusion) are similar to symptoms of having too much alcohol. If your health care provider says that alcohol is safe for you, follow these guidelines:  Limit alcohol intake to no more than 1 drink per day for nonpregnant women and 2 drinks per day for men. One drink equals 12 oz of beer, 5 oz of wine, or 1 oz of hard liquor.  Do not drink on an empty stomach.  Keep yourself hydrated with water, diet soda, or unsweetened iced tea.  Keep in mind that regular soda, juice, and other mixers may contain a lot of sugar and must be counted as carbs. What are tips for following this plan?  Reading food labels  Start by checking the serving size on the "Nutrition Facts" label of packaged foods and drinks. The amount of calories, carbs, fats, and other nutrients listed on the label is based on one serving of the item. Many items contain more than one serving per package.  Check the total grams (g) of carbs in one serving. You can calculate the number of servings of carbs in one  serving by dividing the total carbs by 15. For example, if a food has 30 g of total carbs, it would be equal to 2 servings of carbs.  Check the number of grams (g) of saturated and trans fats in one serving. Choose foods that have low or no amount of these fats.  Check the number of milligrams (mg) of salt (sodium) in one serving. Most people should limit total sodium intake to less than 2,300 mg per day.  Always check the nutrition information of foods  labeled as "low-fat" or "nonfat". These foods may be higher in added sugar or refined carbs and should be avoided.  Talk to your dietitian to identify your daily goals for nutrients listed on the label. Shopping  Avoid buying canned, premade, or processed foods. These foods tend to be high in fat, sodium, and added sugar.  Shop around the outside edge of the grocery store. This includes fresh fruits and vegetables, bulk grains, fresh meats, and fresh dairy. Cooking  Use low-heat cooking methods, such as baking, instead of high-heat cooking methods like deep frying.  Cook using healthy oils, such as olive, canola, or sunflower oil.  Avoid cooking with butter, cream, or high-fat meats. Meal planning  Eat meals and snacks regularly, preferably at the same times every day. Avoid going long periods of time without eating.  Eat foods high in fiber, such as fresh fruits, vegetables, beans, and whole grains. Talk to your dietitian about how many servings of carbs you can eat at each meal.  Eat 4-6 ounces (oz) of lean protein each day, such as lean meat, chicken, fish, eggs, or tofu. One oz of lean protein is equal to: ? 1 oz of meat, chicken, or fish. ? 1 egg. ?  cup of tofu.  Eat some foods each day that contain healthy fats, such as avocado, nuts, seeds, and fish. Lifestyle  Check your blood glucose regularly.  Exercise regularly as told by your health care provider. This may include: ? 150 minutes of moderate-intensity or vigorous-intensity exercise each week. This could be brisk walking, biking, or water aerobics. ? Stretching and doing strength exercises, such as yoga or weightlifting, at least 2 times a week.  Take medicines as told by your health care provider.  Do not use any products that contain nicotine or tobacco, such as cigarettes and e-cigarettes. If you need help quitting, ask your health care provider.  Work with a Social worker or diabetes educator to identify strategies  to manage stress and any emotional and social challenges. Questions to ask a health care provider  Do I need to meet with a diabetes educator?  Do I need to meet with a dietitian?  What number can I call if I have questions?  When are the best times to check my blood glucose? Where to find more information:  American Diabetes Association: diabetes.org  Academy of Nutrition and Dietetics: www.eatright.[employer] of Diabetes and Digestive and Kidney Diseases (NIH): [employer] Summary  A healthy meal plan will help you control your blood glucose and maintain a healthy lifestyle.  Working with a diet and nutrition specialist (dietitian) can help you make a meal plan that is best for you.  Keep in mind that carbohydrates (carbs) and alcohol have immediate effects on your blood glucose levels. It is important to count carbs and to use alcohol carefully. This information is not intended to replace advice given to you by your health care provider. Make sure you discuss any questions  you have with your health care provider. Document Revised: 06/06/2017 Document Reviewed: 07/29/2016 Elsevier Patient Education  2020 [employer].

## 2020-04-26 NOTE — Assessment & Plan Note (Addendum)
Chronic, stable but not optimal with A1c 7.5%. encouraged renewed efforts at following low sugar low carb diabetic diet.  Continue metformin and ozempic.  He will call to schedule eye exam.

## 2020-04-26 NOTE — Assessment & Plan Note (Signed)
Appreciate ENT/audiology eval. Recent CTA head/neck returned normal

## 2020-04-26 NOTE — Assessment & Plan Note (Signed)
Statin intolerance - even weekly lovastatin.  Rpt FLP at CPE and discussed considering zetia.  The 10-year ASCVD risk score Mikey Bussing DC Brooke Bonito., et al., 2013) is: 17.7%   Values used to calculate the score:     Age: 64 years     Sex: Male     Is Non-Hispanic African American: No     Diabetic: Yes     Tobacco smoker: No     Systolic Blood Pressure: 567 mmHg     Is BP treated: No     HDL Cholesterol: 51.6 mg/dL     Total Cholesterol: 160 mg/dL

## 2020-05-03 DIAGNOSIS — E1142 Type 2 diabetes mellitus with diabetic polyneuropathy: Secondary | ICD-10-CM | POA: Diagnosis not present

## 2020-05-03 DIAGNOSIS — L603 Nail dystrophy: Secondary | ICD-10-CM | POA: Diagnosis not present

## 2020-05-03 DIAGNOSIS — L851 Acquired keratosis [keratoderma] palmaris et plantaris: Secondary | ICD-10-CM | POA: Diagnosis not present

## 2020-06-05 LAB — HM DIABETES EYE EXAM

## 2020-06-07 ENCOUNTER — Encounter: Payer: Self-pay | Admitting: Family Medicine

## 2020-06-27 ENCOUNTER — Other Ambulatory Visit: Payer: Self-pay

## 2020-06-27 ENCOUNTER — Other Ambulatory Visit (INDEPENDENT_AMBULATORY_CARE_PROVIDER_SITE_OTHER): Payer: BC Managed Care – PPO

## 2020-06-27 ENCOUNTER — Other Ambulatory Visit: Payer: Self-pay | Admitting: Family Medicine

## 2020-06-27 DIAGNOSIS — IMO0002 Reserved for concepts with insufficient information to code with codable children: Secondary | ICD-10-CM

## 2020-06-27 DIAGNOSIS — E1140 Type 2 diabetes mellitus with diabetic neuropathy, unspecified: Secondary | ICD-10-CM | POA: Diagnosis not present

## 2020-06-27 DIAGNOSIS — E1169 Type 2 diabetes mellitus with other specified complication: Secondary | ICD-10-CM

## 2020-06-27 DIAGNOSIS — E785 Hyperlipidemia, unspecified: Secondary | ICD-10-CM

## 2020-06-27 DIAGNOSIS — E538 Deficiency of other specified B group vitamins: Secondary | ICD-10-CM

## 2020-06-27 DIAGNOSIS — Z125 Encounter for screening for malignant neoplasm of prostate: Secondary | ICD-10-CM

## 2020-06-27 DIAGNOSIS — E1165 Type 2 diabetes mellitus with hyperglycemia: Secondary | ICD-10-CM

## 2020-06-27 LAB — COMPREHENSIVE METABOLIC PANEL
ALT: 27 U/L (ref 0–53)
AST: 18 U/L (ref 0–37)
Albumin: 4.7 g/dL (ref 3.5–5.2)
Alkaline Phosphatase: 71 U/L (ref 39–117)
BUN: 12 mg/dL (ref 6–23)
CO2: 29 mEq/L (ref 19–32)
Calcium: 9.7 mg/dL (ref 8.4–10.5)
Chloride: 104 mEq/L (ref 96–112)
Creatinine, Ser: 0.95 mg/dL (ref 0.40–1.50)
GFR: 85.96 mL/min (ref >60.00)
Glucose, Bld: 170 mg/dL — ABNORMAL HIGH (ref 70–99)
Potassium: 4.5 mEq/L (ref 3.5–5.1)
Sodium: 139 mEq/L (ref 135–145)
Total Bilirubin: 0.6 mg/dL (ref 0.2–1.2)
Total Protein: 7.5 g/dL (ref 6.0–8.3)

## 2020-06-27 LAB — MICROALBUMIN / CREATININE URINE RATIO
Creatinine,U: 168.6 mg/dL
Microalb Creat Ratio: 2.3 mg/g (ref 0.0–30.0)
Microalb, Ur: 4 mg/dL — ABNORMAL HIGH (ref 0.0–1.9)

## 2020-06-27 LAB — LIPID PANEL
Cholesterol: 168 mg/dL (ref 0–200)
HDL: 53.4 mg/dL (ref >39.00)
LDL Cholesterol: 98 mg/dL (ref 0–99)
NonHDL: 114.42
Total CHOL/HDL Ratio: 3
Triglycerides: 81 mg/dL (ref 0.0–149.0)
VLDL: 16.2 mg/dL (ref 0.0–40.0)

## 2020-06-27 LAB — PSA: PSA: 0.2 ng/mL (ref 0.10–4.00)

## 2020-06-27 LAB — VITAMIN B12: Vitamin B-12: 572 pg/mL (ref 211–911)

## 2020-06-28 ENCOUNTER — Other Ambulatory Visit (INDEPENDENT_AMBULATORY_CARE_PROVIDER_SITE_OTHER): Payer: BC Managed Care – PPO

## 2020-06-28 DIAGNOSIS — Z1211 Encounter for screening for malignant neoplasm of colon: Secondary | ICD-10-CM

## 2020-06-28 LAB — FECAL OCCULT BLOOD, IMMUNOCHEMICAL: Fecal Occult Bld: NEGATIVE

## 2020-07-02 LAB — FRUCTOSAMINE: Fructosamine: 342 umol/L — ABNORMAL HIGH (ref 205–285)

## 2020-07-04 ENCOUNTER — Ambulatory Visit (INDEPENDENT_AMBULATORY_CARE_PROVIDER_SITE_OTHER): Payer: BC Managed Care – PPO | Admitting: Family Medicine

## 2020-07-04 ENCOUNTER — Other Ambulatory Visit: Payer: Self-pay

## 2020-07-04 ENCOUNTER — Encounter: Payer: Self-pay | Admitting: Family Medicine

## 2020-07-04 VITALS — BP 140/78 | HR 61 | Temp 97.6°F | Ht 65.75 in | Wt 178.1 lb

## 2020-07-04 DIAGNOSIS — E785 Hyperlipidemia, unspecified: Secondary | ICD-10-CM

## 2020-07-04 DIAGNOSIS — E663 Overweight: Secondary | ICD-10-CM

## 2020-07-04 DIAGNOSIS — E1165 Type 2 diabetes mellitus with hyperglycemia: Secondary | ICD-10-CM

## 2020-07-04 DIAGNOSIS — Z85528 Personal history of other malignant neoplasm of kidney: Secondary | ICD-10-CM

## 2020-07-04 DIAGNOSIS — Z23 Encounter for immunization: Secondary | ICD-10-CM | POA: Diagnosis not present

## 2020-07-04 DIAGNOSIS — E1169 Type 2 diabetes mellitus with other specified complication: Secondary | ICD-10-CM

## 2020-07-04 DIAGNOSIS — E538 Deficiency of other specified B group vitamins: Secondary | ICD-10-CM

## 2020-07-04 DIAGNOSIS — E1140 Type 2 diabetes mellitus with diabetic neuropathy, unspecified: Secondary | ICD-10-CM | POA: Diagnosis not present

## 2020-07-04 DIAGNOSIS — IMO0002 Reserved for concepts with insufficient information to code with codable children: Secondary | ICD-10-CM

## 2020-07-04 DIAGNOSIS — Z0000 Encounter for general adult medical examination without abnormal findings: Secondary | ICD-10-CM

## 2020-07-04 DIAGNOSIS — Z789 Other specified health status: Secondary | ICD-10-CM

## 2020-07-04 DIAGNOSIS — K760 Fatty (change of) liver, not elsewhere classified: Secondary | ICD-10-CM

## 2020-07-04 MED ORDER — METFORMIN HCL 1000 MG PO TABS
1000.0000 mg | ORAL_TABLET | Freq: Two times a day (BID) | ORAL | 3 refills | Status: DC
Start: 2020-07-04 — End: 2021-07-11

## 2020-07-04 MED ORDER — OZEMPIC (0.25 OR 0.5 MG/DOSE) 2 MG/1.5ML ~~LOC~~ SOPN
0.5000 mg | PEN_INJECTOR | SUBCUTANEOUS | 3 refills | Status: DC
Start: 2020-07-04 — End: 2021-05-24

## 2020-07-04 MED ORDER — GABAPENTIN 300 MG PO CAPS
ORAL_CAPSULE | ORAL | 3 refills | Status: DC
Start: 2020-07-04 — End: 2021-07-11

## 2020-07-04 MED ORDER — LISINOPRIL 5 MG PO TABS: 5.0000 mg | ORAL_TABLET | Freq: Every day | ORAL | 3 refills | Status: DC

## 2020-07-04 NOTE — Progress Notes (Signed)
Patient ID: Blake Manning, male    DOB: [birth year], 64 y.o.   MRN: [id number]  This visit was conducted in person.  BP 140/78 (BP Location: Right Arm, Cuff Size: Normal)   Pulse 61   Temp 97.6 F (36.4 C) (Temporal)   Ht 5' 5.75" (1.67 m)   Wt 178 lb 1 oz (80.8 kg)   SpO2 98%   BMI 28.96 kg/m   BP Readings from Last 3 Encounters:  07/04/20 140/78  04/26/20 140/78  06/29/19 136/80    CC: CPE Subjective:   HPI: Deangleo Passage is a 64 y.o. 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 Off potassium citrate for kidney stone history. No recent kidney stones.   Doesn't check BP at home.  Did not tolerate lovastatin - now off all statins  Preventative: [place] 2012@ [facility] all normal-unable to receive records despite multiple attempts.iFOB normal 12/2017, again normal 06/2020. Prostate cancer screening - previously saw urologist([facility])h/o mild enlargement.Nocturia x1.  Flu shot - today COVID vaccine J&J 11/2019. Considering booster, discussed.  Tetanus shot - 2011.  Pneumovax -2015. shingrix - discussed.  Advanced directive - has not completed. Would want wife Bethena Roys to be HCPOA. Will work on this, bring [place] copy when completed.  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 Has restarted playing tennis at [employer] court, neighborhood bracket Diet: good waterand flavored water, fruits/vegetables daily- sugar free dessert     Relevant past medical, surgical, family and social history reviewed and updated as indicated. Interim medical history since our last visit reviewed. Allergies and medications reviewed and  updated. Outpatient Medications Prior to Visit  Medication Sig Dispense Refill  . Accu-Chek FastClix Lancets MISC 1 each by Does not apply route as directed. Use as directed to check blood sugar 102 each 3  . aspirin 81 MG EC tablet Take 1 tablet (81 mg total) by mouth daily. Swallow whole.    . Blood Glucose Monitoring Suppl (ACCU-CHEK NANO SMARTVIEW) w/Device KIT 1 each by Does not apply route as directed. Use as directed to check blood sugar 1 kit 0  . Cyanocobalamin (B-12) 1000 MCG SUBL Place 1 tablet under the tongue daily.    Marland Kitchen glucose blood (ACCU-CHEK GUIDE) test strip Check sugar once daily. DX E11.40 100 strip 3  . gabapentin (NEURONTIN) 300 MG capsule TAKE 1 CAPSULE BY MOUTH EVERYDAY AT BEDTIME 90 capsule 3  . lovastatin (MEVACOR) 20 MG tablet Take 1 tablet (20 mg total) by mouth once a week. 13 tablet 3  . metFORMIN (GLUCOPHAGE) 1000 MG tablet TAKE 1 TABLET (1,000 MG TOTAL) BY MOUTH 2 (TWO) TIMES DAILY WITH A MEAL. 180 tablet 1  . Semaglutide,0.25 or 0.5MG /DOS, (OZEMPIC, 0.25 OR 0.5 MG/DOSE,) 2 MG/1.5ML SOPN Inject 0.5 mg into the skin once a week. 3 pen 3   No facility-administered medications prior to visit.     Per HPI unless specifically indicated in ROS section below Review of Systems  Constitutional: Negative for activity change, appetite change, chills, fatigue, fever and unexpected weight change.  HENT: Negative for hearing loss.   Eyes: Negative for visual disturbance.  Respiratory: Negative for cough, chest tightness, shortness of breath and  wheezing.   Cardiovascular: Negative for chest pain, palpitations and leg swelling.  Gastrointestinal: Positive for nausea (ozempic related). Negative for abdominal distention, abdominal pain, blood in stool, constipation, diarrhea and vomiting.  Genitourinary: Negative for difficulty urinating and hematuria.  Musculoskeletal: Negative for arthralgias, myalgias and neck pain.  Skin: Negative for rash.  Neurological: Negative for  dizziness, seizures, syncope and headaches.  Hematological: Negative for adenopathy. Does not bruise/bleed easily.  Psychiatric/Behavioral: Negative for dysphoric mood. The patient is not nervous/anxious.    Objective:  BP 140/78 (BP Location: Right Arm, Cuff Size: Normal)   Pulse 61   Temp 97.6 F (36.4 C) (Temporal)   Ht 5' 5.75" (1.67 m)   Wt 178 lb 1 oz (80.8 kg)   SpO2 98%   BMI 28.96 kg/m   Wt Readings from Last 3 Encounters:  07/04/20 178 lb 1 oz (80.8 kg)  04/26/20 179 lb 7 oz (81.4 kg)  06/29/19 178 lb 2 oz (80.8 kg)      Physical Exam Vitals and nursing note reviewed.  Constitutional:      General: He is not in acute distress.    Appearance: Normal appearance. He is well-developed and well-nourished. He is not ill-appearing.  HENT:     Head: Normocephalic and atraumatic.     Right Ear: Hearing, tympanic membrane, ear canal and external ear normal.     Left Ear: Hearing, tympanic membrane, ear canal and external ear normal.     Mouth/Throat:     Mouth: Oropharynx is clear and moist and mucous membranes are normal.     Pharynx: No posterior oropharyngeal edema.  Eyes:     General: No scleral icterus.    Extraocular Movements: Extraocular movements intact and EOM normal.     Conjunctiva/sclera: Conjunctivae normal.     Pupils: Pupils are equal, round, and reactive to light.  Neck:     Thyroid: No thyroid mass or thyromegaly.  Cardiovascular:     Rate and Rhythm: Normal rate and regular rhythm.     Pulses: Normal pulses and intact distal pulses.          Radial pulses are 2+ on the right side and 2+ on the left side.     Heart sounds: Normal heart sounds. No murmur heard.   Pulmonary:     Effort: Pulmonary effort is normal. No respiratory distress.     Breath sounds: Normal breath sounds. No wheezing, rhonchi or rales.  Abdominal:     General: Abdomen is flat. Bowel sounds are normal. There is no distension.     Palpations: Abdomen is soft. There is no mass.      Tenderness: There is no abdominal tenderness. There is no guarding or rebound.     Hernia: No hernia is present.  Musculoskeletal:        General: No edema. Normal range of motion.     Cervical back: Normal range of motion and neck supple.     Right lower leg: No edema.     Left lower leg: No edema.  Lymphadenopathy:     Cervical: No cervical adenopathy.  Skin:    General: Skin is warm and dry.     Findings: No rash.  Neurological:     General: No focal deficit present.     Mental Status: He is alert and oriented to person, place, and time.     Comments: CN grossly intact, station and gait intact  Psychiatric:        Mood and  Affect: Mood and affect and mood normal.        Behavior: Behavior normal.        Thought Content: Thought content normal.        Judgment: Judgment normal.       Results for orders placed or performed in visit on 06/28/20  Fecal occult blood, imunochemical   Specimen: Stool  Result Value Ref Range   Fecal Occult Bld Negative Negative   Lab Results  Component Value Date   HGBA1C 7.5 (A) 04/26/2020    Lab Results  Component Value Date   CHOL 168 06/27/2020   HDL 53.40 06/27/2020   LDLCALC 98 06/27/2020   LDLDIRECT 104.0 12/19/2017   TRIG 81.0 06/27/2020   CHOLHDL 3 06/27/2020    Assessment & Plan:  This visit occurred during the SARS-CoV-2 public health emergency.  Safety protocols were in place, including screening questions prior to the visit, additional usage of staff PPE, and extensive cleaning of exam room while observing appropriate contact time as indicated for disinfecting solutions.   Problem List Items Addressed This Visit    Type 2 diabetes, uncontrolled, with neuropathy ([place])    Worsening as evidenced by increase in fructosamine, attributed to holiday dietary indiscretions. Encouraged renewed efforts to follow diabetic diet. ozempic dose likely limited due to nausea.  I did ask him to return in 3 months for DM f/u visit given  noted worsened control.       Relevant Medications   lisinopril (ZESTRIL) 5 MG tablet   metFORMIN (GLUCOPHAGE) 1000 MG tablet   Semaglutide,0.25 or 0.5MG /DOS, (OZEMPIC, 0.25 OR 0.5 MG/DOSE,) 2 MG/1.5ML SOPN   Statin intolerance   Overweight with body mass index (BMI) 25.0-29.9   Low vitamin B12 level    Continue daily replacement.       Hyperlipidemia associated with type 2 diabetes mellitus (HCC)    Statin intolerance, even weekly lovastatin. Continue to monitor off medication.  The 10-year ASCVD risk score Mikey Bussing DC Brooke Bonito., et al., 2013) is: 18%   Values used to calculate the score:     Age: 88 years     Sex: Male     Is Non-Hispanic African American: No     Diabetic: Yes     Tobacco smoker: No     Systolic Blood Pressure: 914 mmHg     Is BP treated: No     HDL Cholesterol: 53.4 mg/dL     Total Cholesterol: 168 mg/dL       Relevant Medications   lisinopril (ZESTRIL) 5 MG tablet   metFORMIN (GLUCOPHAGE) 1000 MG tablet   Semaglutide,0.25 or 0.5MG /DOS, (OZEMPIC, 0.25 OR 0.5 MG/DOSE,) 2 MG/1.5ML SOPN   History of renal cell cancer    Has not recently seen urology. Sounds like released from care Check UA yearly to monitor this.       Health maintenance examination - Primary    Preventative protocols reviewed and updated unless pt declined. Discussed healthy diet and lifestyle.       Fatty liver    LFTs normal.        Other Visit Diagnoses    Need for shingles vaccine       Relevant Orders   Varicella-zoster vaccine IM (Completed)       Meds ordered this encounter  Medications  . lisinopril (ZESTRIL) 5 MG tablet    Sig: Take 1 tablet (5 mg total) by mouth daily.    Dispense:  90 tablet    Refill:  3  .  gabapentin (NEURONTIN) 300 MG capsule    Sig: TAKE 1 CAPSULE BY MOUTH EVERYDAY AT BEDTIME    Dispense:  90 capsule    Refill:  3  . metFORMIN (GLUCOPHAGE) 1000 MG tablet    Sig: Take 1 tablet (1,000 mg total) by mouth 2 (two) times daily with a meal.     Dispense:  180 tablet    Refill:  3  . Semaglutide,0.25 or 0.5MG /DOS, (OZEMPIC, 0.25 OR 0.5 MG/DOSE,) 2 MG/1.5ML SOPN    Sig: Inject 0.5 mg into the skin once a week.    Dispense:  4.5 mL    Refill:  3   Orders Placed This Encounter  Procedures  . Varicella-zoster vaccine IM    Patient instructions: Shingrix vaccine today. Return in 2-6 months to complete series. May do Tetanus shot next visit.  Return in 3-4 months for diabetes follow up visit.  Good to see you today  Follow up plan: Return in about 3 months (around 10/02/2020) for follow up visit.  Ria Bush, MD

## 2020-07-04 NOTE — Assessment & Plan Note (Signed)
Preventative protocols reviewed and updated unless pt declined. Discussed healthy diet and lifestyle.  

## 2020-07-04 NOTE — Assessment & Plan Note (Signed)
Statin intolerance, even weekly lovastatin. Continue to monitor off medication.  The 10-year ASCVD risk score Denman George DC Montez Hageman., et al., 2013) is: 18%   Values used to calculate the score:     Age: 64 years     Sex: Male     Is Non-Hispanic African American: No     Diabetic: Yes     Tobacco smoker: No     Systolic Blood Pressure: 140 mmHg     Is BP treated: No     HDL Cholesterol: 53.4 mg/dL     Total Cholesterol: 168 mg/dL

## 2020-07-04 NOTE — Patient Instructions (Addendum)
Shingrix vaccine today. Return in 2-6 months to complete series. May do Tetanus shot next visit.  Return in 3-4 months for diabetes follow up visit.  Good to see you today   Health Maintenance, Male Adopting a healthy lifestyle and getting preventive care are important in promoting health and wellness. Ask your health care provider about:  The right schedule for you to have regular tests and exams.  Things you can do on your own to prevent diseases and keep yourself healthy. What should I know about diet, weight, and exercise? Eat a healthy diet   Eat a diet that includes plenty of vegetables, fruits, low-fat dairy products, and lean protein.  Do not eat a lot of foods that are high in solid fats, added sugars, or sodium. Maintain a healthy weight Body mass index (BMI) is a measurement that can be used to identify possible weight problems. It estimates body fat based on height and weight. Your health care provider can help determine your BMI and help you achieve or maintain a healthy weight. Get regular exercise Get regular exercise. This is one of the most important things you can do for your health. Most adults should:  Exercise for at least 150 minutes each week. The exercise should increase your heart rate and make you sweat (moderate-intensity exercise).  Do strengthening exercises at least twice a week. This is in addition to the moderate-intensity exercise.  Spend less time sitting. Even light physical activity can be beneficial. Watch cholesterol and blood lipids Have your blood tested for lipids and cholesterol at 62 years of age, then have this test every 5 years. You may need to have your cholesterol levels checked more often if:  Your lipid or cholesterol levels are high.  You are older than 61 years of age.  You are at high risk for heart disease. What should I know about cancer screening? Many types of cancers can be detected early and may often be prevented.  Depending on your health history and family history, you may need to have cancer screening at various ages. This may include screening for:  Colorectal cancer.  Prostate cancer.  Skin cancer.  Lung cancer. What should I know about heart disease, diabetes, and high blood pressure? Blood pressure and heart disease  High blood pressure causes heart disease and increases the risk of stroke. This is more likely to develop in people who have high blood pressure readings, are of African descent, or are overweight.  Talk with your health care provider about your target blood pressure readings.  Have your blood pressure checked: ? Every 3-5 years if you are 55-18 years of age. ? Every year if you are 12 years old or older.  If you are between the ages of 63 and 56 and are a current or former smoker, ask your health care provider if you should have a one-time screening for abdominal aortic aneurysm (AAA). Diabetes Have regular diabetes screenings. This checks your fasting blood sugar level. Have the screening done:  Once every three years after age 85 if you are at a normal weight and have a low risk for diabetes.  More often and at a younger age if you are overweight or have a high risk for diabetes. What should I know about preventing infection? Hepatitis B If you have a higher risk for hepatitis B, you should be screened for this virus. Talk with your health care provider to find out if you are at risk for hepatitis B infection.  Hepatitis C Blood testing is recommended for:  Everyone born from [birth year] through 1963.  Anyone with known risk factors for hepatitis C. Sexually transmitted infections (STIs)  You should be screened each year for STIs, including gonorrhea and chlamydia, if: ? You are sexually active and are younger than 62 years of age. ? You are older than 62 years of age and your health care provider tells you that you are at risk for this type of infection. ? Your sexual  activity has changed since you were last screened, and you are at increased risk for chlamydia or gonorrhea. Ask your health care provider if you are at risk.  Ask your health care provider about whether you are at high risk for HIV. Your health care provider may recommend a prescription medicine to help prevent HIV infection. If you choose to take medicine to prevent HIV, you should first get tested for HIV. You should then be tested every 3 months for as long as you are taking the medicine. Follow these instructions at home: Lifestyle  Do not use any products that contain nicotine or tobacco, such as cigarettes, e-cigarettes, and chewing tobacco. If you need help quitting, ask your health care provider.  Do not use street drugs.  Do not share needles.  Ask your health care provider for help if you need support or information about quitting drugs. Alcohol use  Do not drink alcohol if your health care provider tells you not to drink.  If you drink alcohol: ? Limit how much you have to 0-2 drinks a day. ? Be aware of how much alcohol is in your drink. In the [place] one drink equals one 12 oz bottle of beer (355 mL), one 5 oz glass of wine (148 mL), or one 1 oz glass of hard liquor (44 mL). General instructions  Schedule regular health, dental, and eye exams.  Stay current with your vaccines.  Tell your health care provider if: ? You often feel depressed. ? You have ever been abused or do not feel safe at home. Summary  Adopting a healthy lifestyle and getting preventive care are important in promoting health and wellness.  Follow your health care provider's instructions about healthy diet, exercising, and getting tested or screened for diseases.  Follow your health care provider's instructions on monitoring your cholesterol and blood pressure. This information is not intended to replace advice given to you by your health care provider. Make sure you discuss any questions you have  with your health care provider. Document Revised: 06/17/2018 Document Reviewed: 06/17/2018 Elsevier Patient Education  2020 [employer].

## 2020-07-04 NOTE — Assessment & Plan Note (Addendum)
Worsening as evidenced by increase in fructosamine, attributed to holiday dietary indiscretions. Encouraged renewed efforts to follow diabetic diet. ozempic dose likely limited due to nausea.  I did ask him to return in 3 months for DM f/u visit given noted worsened control.

## 2020-07-04 NOTE — Assessment & Plan Note (Addendum)
Has not recently seen urology. Sounds like released from care Check UA yearly to monitor this.

## 2020-07-04 NOTE — Assessment & Plan Note (Signed)
Continue daily replacement 

## 2020-07-04 NOTE — Assessment & Plan Note (Signed)
LFTs normal

## 2020-08-15 ENCOUNTER — Telehealth: Payer: Self-pay | Admitting: Family Medicine

## 2020-08-15 NOTE — Telephone Encounter (Signed)
Patients wife is calling in wanting to know why his prescription was not done for 90 days. Please advise. EM   ozempic

## 2020-08-15 NOTE — Telephone Encounter (Signed)
Pt's wife, Bethena Roys (on dpr), returning call.  I explained rx was sent as a 90-day.  Suggested she contact the pharmacy asking why pt was not given full refill.  She verbalizes understanding and will call the pharmacy.

## 2020-08-15 NOTE — Telephone Encounter (Signed)
Lvm asking pt to call back

## 2020-08-16 NOTE — Telephone Encounter (Signed)
Pt called in and stated that wanted to know about the RX, informed him to reach back out to the pharmacy to see why

## 2020-10-02 ENCOUNTER — Ambulatory Visit: Payer: BC Managed Care – PPO | Admitting: Family Medicine

## 2020-10-25 ENCOUNTER — Encounter: Payer: Self-pay | Admitting: Family Medicine

## 2020-10-25 ENCOUNTER — Other Ambulatory Visit: Payer: Self-pay

## 2020-10-25 ENCOUNTER — Ambulatory Visit (INDEPENDENT_AMBULATORY_CARE_PROVIDER_SITE_OTHER): Payer: BC Managed Care – PPO | Admitting: Family Medicine

## 2020-10-25 VITALS — BP 128/78 | HR 66 | Temp 97.7°F | Ht 65.75 in | Wt 180.1 lb

## 2020-10-25 DIAGNOSIS — E1140 Type 2 diabetes mellitus with diabetic neuropathy, unspecified: Secondary | ICD-10-CM

## 2020-10-25 DIAGNOSIS — Z23 Encounter for immunization: Secondary | ICD-10-CM | POA: Diagnosis not present

## 2020-10-25 DIAGNOSIS — E1165 Type 2 diabetes mellitus with hyperglycemia: Secondary | ICD-10-CM | POA: Diagnosis not present

## 2020-10-25 DIAGNOSIS — IMO0002 Reserved for concepts with insufficient information to code with codable children: Secondary | ICD-10-CM

## 2020-10-25 LAB — POCT GLYCOSYLATED HEMOGLOBIN (HGB A1C): Hemoglobin A1C: 7.6 % — AB (ref 4.0–5.6)

## 2020-10-25 NOTE — Assessment & Plan Note (Signed)
Chronic, stable, improving.  Notes occasional cbg fluctuations of unclear cause. Continue current regimen, he will bring me log to next visit of BID cbg's.  Encouraged diabetic diet - he is having conversations with wife for only healthy snack choices in the house.

## 2020-10-25 NOTE — Patient Instructions (Signed)
You are doing well today sugar was better but still above goal (A1c <7%).  Return in 4 months for diabetes follow up. If you can at least 1-2 weeks before next visit, keep log of fasting sugars and one other time in the day.

## 2020-10-25 NOTE — Progress Notes (Signed)
Patient ID: Blake Manning, male    DOB: [birth year], 65 y.o.   MRN: [id number]  This visit was conducted in person.  BP 128/78   Pulse 66   Temp 97.7 F (36.5 C) (Temporal)   Ht 5' 5.75" (1.67 m)   Wt 180 lb 1 oz (81.7 kg)   SpO2 98%   BMI 29.28 kg/m    CC: DM 3 mo f/u visit  Subjective:   HPI: Blake Manning is a 65 y.o. male presenting on 10/25/2020 for Diabetes (Here for 3 mo f/u.)   DM - does regularly check sugars - fasting wide fluctuations 110s-300s?? Compliant with antihyperglycemic regimen which includes: metformin 1045m bid, ozempic 0.521mweekly. [place]. Notes he feels ill when cbg's drop to low 100s. Last diabetic eye exam 05/2020. Glucometer brand: accucheck. DSME: did not complete.  Lab Results  Component Value Date   HGBA1C 7.6 (A) 10/25/2020   Diabetic Foot Exam - Simple   No data filed    Lab Results  Component Value Date   MICROALBUR 4.0 (H) 06/27/2020        Relevant past medical, surgical, family and social history reviewed and updated as indicated. Interim medical history since our last visit reviewed. Allergies and medications reviewed and updated. Outpatient Medications Prior to Visit  Medication Sig Dispense Refill  . Accu-Chek FastClix Lancets MISC 1 each by Does not apply route as directed. Use as directed to check blood sugar 102 each 3  . aspirin 81 MG EC tablet Take 1 tablet (81 mg total) by mouth daily. Swallow whole.    . Blood Glucose Monitoring Suppl (ACCU-CHEK NANO SMARTVIEW) w/Device KIT 1 each by Does not apply route as directed. Use as directed to check blood sugar 1 kit 0  . Cyanocobalamin (B-12) 1000 MCG SUBL Place 1 tablet under the tongue daily.    . Marland Kitchenabapentin (NEURONTIN) 300 MG capsule TAKE 1 CAPSULE BY MOUTH EVERYDAY AT BEDTIME 90 capsule 3  . glucose blood (ACCU-CHEK GUIDE) test strip Check sugar once daily. DX E11.40 100 strip 3  . lisinopril (ZESTRIL) 5 MG tablet Take 1 tablet (5 mg total) by mouth  daily. 90 tablet 3  . metFORMIN (GLUCOPHAGE) 1000 MG tablet Take 1 tablet (1,000 mg total) by mouth 2 (two) times daily with a meal. 180 tablet 3  . Semaglutide,0.25 or 0.5MG/DOS, (OZEMPIC, 0.25 OR 0.5 MG/DOSE,) 2 MG/1.5ML SOPN Inject 0.5 mg into the skin once a week. 4.5 mL 3   No facility-administered medications prior to visit.     Per HPI unless specifically indicated in ROS section below Review of Systems Objective:  BP 128/78   Pulse 66   Temp 97.7 F (36.5 C) (Temporal)   Ht 5' 5.75" (1.67 m)   Wt 180 lb 1 oz (81.7 kg)   SpO2 98%   BMI 29.28 kg/m   Wt Readings from Last 3 Encounters:  10/25/20 180 lb 1 oz (81.7 kg)  07/04/20 178 lb 1 oz (80.8 kg)  04/26/20 179 lb 7 oz (81.4 kg)      Physical Exam Vitals and nursing note reviewed.  Constitutional:      General: He is not in acute distress.    Appearance: Normal appearance. He is well-developed. He is not ill-appearing.  HENT:     Head: Normocephalic and atraumatic.  Eyes:     General: No scleral icterus.    Extraocular Movements: Extraocular movements intact.     Conjunctiva/sclera: Conjunctivae normal.  Pupils: Pupils are equal, round, and reactive to light.  Neck:     Thyroid: No thyroid mass or thyromegaly.  Cardiovascular:     Rate and Rhythm: Normal rate and regular rhythm.     Pulses: Normal pulses.     Heart sounds: Normal heart sounds. No murmur heard.   Pulmonary:     Effort: Pulmonary effort is normal. No respiratory distress.     Breath sounds: Normal breath sounds. No wheezing, rhonchi or rales.  Musculoskeletal:     Cervical back: Normal range of motion and neck supple.     Right lower leg: No edema.     Left lower leg: No edema.     Comments: See HPI for foot exam if done  Lymphadenopathy:     Cervical: No cervical adenopathy.  Skin:    General: Skin is warm and dry.     Findings: No rash.  Neurological:     Mental Status: He is alert.       Results for orders placed or performed  in visit on 10/25/20  POCT glycosylated hemoglobin (Hb A1C)  Result Value Ref Range   Hemoglobin A1C 7.6 (A) 4.0 - 5.6 %   HbA1c POC (<> result, manual entry)     HbA1c, POC (prediabetic range)     HbA1c, POC (controlled diabetic range)     Assessment & Plan:  This visit occurred during the SARS-CoV-2 public health emergency.  Safety protocols were in place, including screening questions prior to the visit, additional usage of staff PPE, and extensive cleaning of exam room while observing appropriate contact time as indicated for disinfecting solutions.   Problem List Items Addressed This Visit    Type 2 diabetes, uncontrolled, with neuropathy ([place]) - Primary    Chronic, stable, improving.  Notes occasional cbg fluctuations of unclear cause. Continue current regimen, he will bring me log to next visit of BID cbg's.  Encouraged diabetic diet - he is having conversations with wife for only healthy snack choices in the house.       Relevant Orders   POCT glycosylated hemoglobin (Hb A1C) (Completed)    Other Visit Diagnoses    Need for shingles vaccine       Relevant Orders   Varicella-zoster vaccine IM (Completed)       No orders of the defined types were placed in this encounter.  Orders Placed This Encounter  Procedures  . Varicella-zoster vaccine IM  . POCT glycosylated hemoglobin (Hb A1C)    Patient Instructions  You are doing well today sugar was better but still above goal (A1c <7%).  Return in 4 months for diabetes follow up. If you can at least 1-2 weeks before next visit, keep log of fasting sugars and one other time in the day.    Follow up plan: No follow-ups on file.  Ria Bush, MD

## 2020-11-01 DIAGNOSIS — L603 Nail dystrophy: Secondary | ICD-10-CM | POA: Diagnosis not present

## 2020-11-01 DIAGNOSIS — L851 Acquired keratosis [keratoderma] palmaris et plantaris: Secondary | ICD-10-CM | POA: Diagnosis not present

## 2020-11-01 DIAGNOSIS — E1142 Type 2 diabetes mellitus with diabetic polyneuropathy: Secondary | ICD-10-CM | POA: Diagnosis not present

## 2021-01-10 DIAGNOSIS — M79671 Pain in right foot: Secondary | ICD-10-CM | POA: Diagnosis not present

## 2021-01-10 DIAGNOSIS — S86019A Strain of unspecified Achilles tendon, initial encounter: Secondary | ICD-10-CM | POA: Diagnosis not present

## 2021-01-10 DIAGNOSIS — S92001A Unspecified fracture of right calcaneus, initial encounter for closed fracture: Secondary | ICD-10-CM | POA: Diagnosis not present

## 2021-01-10 DIAGNOSIS — M216X1 Other acquired deformities of right foot: Secondary | ICD-10-CM | POA: Diagnosis not present

## 2021-01-31 DIAGNOSIS — S86019A Strain of unspecified Achilles tendon, initial encounter: Secondary | ICD-10-CM | POA: Diagnosis not present

## 2021-01-31 DIAGNOSIS — S92001D Unspecified fracture of right calcaneus, subsequent encounter for fracture with routine healing: Secondary | ICD-10-CM | POA: Diagnosis not present

## 2021-01-31 DIAGNOSIS — M79671 Pain in right foot: Secondary | ICD-10-CM | POA: Diagnosis not present

## 2021-01-31 DIAGNOSIS — M216X1 Other acquired deformities of right foot: Secondary | ICD-10-CM | POA: Diagnosis not present

## 2021-02-21 DIAGNOSIS — S86019A Strain of unspecified Achilles tendon, initial encounter: Secondary | ICD-10-CM | POA: Diagnosis not present

## 2021-02-21 DIAGNOSIS — M79671 Pain in right foot: Secondary | ICD-10-CM | POA: Diagnosis not present

## 2021-02-21 DIAGNOSIS — M216X1 Other acquired deformities of right foot: Secondary | ICD-10-CM | POA: Diagnosis not present

## 2021-02-21 DIAGNOSIS — S92001D Unspecified fracture of right calcaneus, subsequent encounter for fracture with routine healing: Secondary | ICD-10-CM | POA: Diagnosis not present

## 2021-02-26 ENCOUNTER — Ambulatory Visit: Payer: BC Managed Care – PPO | Admitting: Family Medicine

## 2021-05-23 ENCOUNTER — Other Ambulatory Visit: Payer: Self-pay | Admitting: Family Medicine

## 2021-05-23 NOTE — Telephone Encounter (Signed)
Pt call in to know status on refill Semaglutide,0.25 or 0.5MG /DOS, (OZEMPIC, 0.25 OR 0.5 MG/DOSE,) 2 MG/1.5ML  please advise

## 2021-06-19 ENCOUNTER — Telehealth: Payer: Self-pay | Admitting: Family Medicine

## 2021-06-19 NOTE — Telephone Encounter (Signed)
Spoke with Mia of [facility] asking about Ozempic.  States they received notice from pt's insurance that med is not covered.  Says ins co is not suggesting PA, just that Ozempic is not covered.  Lvm asking pt to call back. Need to inform pt his ins co does not cover Ozempic.  He will need to contact them and ask what med [comparable to Ozempic] is covered.  Then call us back to let us know what they say.

## 2021-06-19 NOTE — Telephone Encounter (Signed)
Pt called stating that the pharmacy would not refill medication OZEMPIC, 0.25 OR 0.5 MG/DOSE, 2 MG/1.5ML SOPN, without authorization from doctor because pt has new insurance. Please advise.

## 2021-06-20 NOTE — Telephone Encounter (Signed)
Left message for patient to return call to office at [phone]  

## 2021-06-20 NOTE — Telephone Encounter (Signed)
Results have been relayed to the patient. The patient verbalized understanding. No questions at this time.  He will call back when he has answers form his insurance company.

## 2021-07-03 ENCOUNTER — Ambulatory Visit: Payer: BC Managed Care – PPO | Admitting: Family Medicine

## 2021-07-08 ENCOUNTER — Other Ambulatory Visit: Payer: Self-pay | Admitting: Family Medicine

## 2021-07-09 ENCOUNTER — Encounter: Payer: Self-pay | Admitting: Family Medicine

## 2021-07-10 ENCOUNTER — Telehealth: Payer: Self-pay

## 2021-07-10 ENCOUNTER — Other Ambulatory Visit: Payer: Self-pay | Admitting: Family Medicine

## 2021-07-10 NOTE — Telephone Encounter (Signed)
Working on [employer], Best boy in the chart

## 2021-07-10 NOTE — Telephone Encounter (Signed)
PA submitted for Ozempic through new insurance [employer]-see mychart from patient with information, patient aware we are waiting for feedback from insurance, PA goes through [facility].  BIN [id number] PCN [employer] [id number] Member ID [id number]  (Key: [id number])-submitted through covermymeds.  Awaiting for response.

## 2021-07-11 NOTE — Telephone Encounter (Signed)
ERx 

## 2021-07-11 NOTE — Telephone Encounter (Signed)
Refill request Gabapentin Last refill 07/04/20 #90/3 Last office visit 10/25/20 Upcoming appointment 07/17/21

## 2021-07-13 NOTE — Telephone Encounter (Signed)
PA approved for dates 07/10/21-07/10/22. Patient advised via mychart

## 2021-07-17 ENCOUNTER — Ambulatory Visit (INDEPENDENT_AMBULATORY_CARE_PROVIDER_SITE_OTHER): Payer: 59 | Admitting: Family Medicine

## 2021-07-17 ENCOUNTER — Encounter: Payer: Self-pay | Admitting: Family Medicine

## 2021-07-17 ENCOUNTER — Other Ambulatory Visit: Payer: Self-pay

## 2021-07-17 VITALS — BP 128/70 | HR 69 | Temp 97.9°F | Ht 65.75 in | Wt 179.0 lb

## 2021-07-17 DIAGNOSIS — D563 Thalassemia minor: Secondary | ICD-10-CM

## 2021-07-17 DIAGNOSIS — Z789 Other specified health status: Secondary | ICD-10-CM

## 2021-07-17 DIAGNOSIS — Z1211 Encounter for screening for malignant neoplasm of colon: Secondary | ICD-10-CM | POA: Diagnosis not present

## 2021-07-17 DIAGNOSIS — E785 Hyperlipidemia, unspecified: Secondary | ICD-10-CM | POA: Diagnosis not present

## 2021-07-17 DIAGNOSIS — Z23 Encounter for immunization: Secondary | ICD-10-CM

## 2021-07-17 DIAGNOSIS — E1169 Type 2 diabetes mellitus with other specified complication: Secondary | ICD-10-CM

## 2021-07-17 LAB — BASIC METABOLIC PANEL
BUN: 14 mg/dL (ref 6–23)
CO2: 27 mEq/L (ref 19–32)
Calcium: 10.5 mg/dL (ref 8.4–10.5)
Chloride: 99 mEq/L (ref 96–112)
Creatinine, Ser: 0.94 mg/dL (ref 0.40–1.50)
GFR: 86.42 mL/min (ref >60.00)
Glucose, Bld: 126 mg/dL — ABNORMAL HIGH (ref 70–99)
Potassium: 4.3 mEq/L (ref 3.5–5.1)
Sodium: 134 mEq/L — ABNORMAL LOW (ref 135–145)

## 2021-07-17 LAB — CBC WITH DIFFERENTIAL/PLATELET
Basophils Absolute: 0 10*3/uL (ref 0.0–0.1)
Basophils Relative: 0.6 % (ref 0.0–3.0)
Eosinophils Absolute: 0.1 10*3/uL (ref 0.0–0.7)
Eosinophils Relative: 1.8 % (ref 0.0–5.0)
HCT: 39.3 % (ref 39.0–52.0)
Hemoglobin: 12.3 g/dL — ABNORMAL LOW (ref 13.0–17.0)
Lymphocytes Relative: 38.2 % (ref 12.0–46.0)
Lymphs Abs: 2.8 10*3/uL (ref 0.7–4.0)
MCHC: 31.3 g/dL (ref 30.0–36.0)
MCV: 70 fl — ABNORMAL LOW (ref 78.0–100.0)
Monocytes Absolute: 0.7 10*3/uL (ref 0.1–1.0)
Monocytes Relative: 9.1 % (ref 3.0–12.0)
Neutro Abs: 3.6 10*3/uL (ref 1.4–7.7)
Neutrophils Relative %: 50.3 % (ref 43.0–77.0)
Platelets: 312 10*3/uL (ref 150.0–400.0)
RBC: 5.61 Mil/uL (ref 4.22–5.81)
RDW: 14.4 % (ref 11.5–15.5)
WBC: 7.3 10*3/uL (ref 4.0–10.5)

## 2021-07-17 LAB — POCT GLYCOSYLATED HEMOGLOBIN (HGB A1C): Hemoglobin A1C: 7.9 % — AB (ref 4.0–5.6)

## 2021-07-17 LAB — LDL CHOLESTEROL, DIRECT: Direct LDL: 98 mg/dL

## 2021-07-17 NOTE — Assessment & Plan Note (Signed)
Statin intolerance. Update d LDL The 10-year ASCVD risk score (Arnett DK, et al., 2019) is: 17%   Values used to calculate the score:     Age: 66 years     Sex: Male     Is Non-Hispanic African American: No     Diabetic: Yes     Tobacco smoker: No     Systolic Blood Pressure: 539 mmHg     Is BP treated: No     HDL Cholesterol: 53.4 mg/dL     Total Cholesterol: 168 mg/dL

## 2021-07-17 NOTE — Assessment & Plan Note (Signed)
Update cbc. 

## 2021-07-17 NOTE — Assessment & Plan Note (Signed)
Chronic, deteriorated control based on recent A1c - in setting of holiday diet and few missed ozempic doses. Now back on ozempic - will continue to monitor. Encouraged renewed efforts to follow diabetic diet. Reassess control at CPE in 4-6 months.

## 2021-07-17 NOTE — Progress Notes (Signed)
Patient ID: Blake Manning, male    DOB: [birth year], 66 y.o.   MRN: [id number]  This visit was conducted in person.  BP 128/70    Pulse 69    Temp 97.9 F (36.6 C) (Temporal)    Ht 5' 5.75" (1.67 m)    Wt 179 lb (81.2 kg)    SpO2 96%    BMI 29.11 kg/m    CC: diabetes follow up visit  Subjective:   HPI: Blake Manning is a 66 y.o. male presenting on 07/17/2021 for Diabetes (Here for f/u.)   Lots of traveling this past year, then retired 05/2021.  New insurance ([employer]).  Wants to restart pickle ball.   DM - does regularly check sugars fasting 130s, postprandial today 210. Feels tired/fatigued when sugars drop to low 100s. Compliant with antihyperglycemic regimen which includes: ozempic 0.109m weekly, metformin 10054mbid. Ran out of ozempic for 2 wks. Tolerating med well except for some nausea. Denies low sugars or hypoglycemic symptoms. Denies paresthesias, blurry vision. Last diabetic eye exam 05/2020 - DUE. Glucometer brand: accuchek. Last foot exam: 04/2020 - DUE. DSME: has not completed.  Lab Results  Component Value Date   HGBA1C 7.9 (A) 07/17/2021   Diabetic Foot Exam - Simple   Simple Foot Form Diabetic Foot exam was performed with the following findings: Yes 07/17/2021 11:56 AM  Visual Inspection See comments: Yes Sensation Testing Intact to touch and monofilament testing bilaterally: Yes Pulse Check Posterior Tibialis and Dorsalis pulse intact bilaterally: Yes Comments Callus formation to medial great toes    Lab Results  Component Value Date   MICROALBUR 4.0 (H) 06/27/2020         Relevant past medical, surgical, family and social history reviewed and updated as indicated. Interim medical history since our last visit reviewed. Allergies and medications reviewed and updated. Outpatient Medications Prior to Visit  Medication Sig Dispense Refill   Accu-Chek FastClix Lancets MISC 1 each by Does not apply route as directed. Use as directed to check blood sugar  102 each 3   aspirin 81 MG EC tablet Take 1 tablet (81 mg total) by mouth daily. Swallow whole.     Blood Glucose Monitoring Suppl (ACCU-CHEK NANO SMARTVIEW) w/Device KIT 1 each by Does not apply route as directed. Use as directed to check blood sugar 1 kit 0   Cyanocobalamin (B-12) 1000 MCG SUBL Place 1 tablet under the tongue daily.     gabapentin (NEURONTIN) 300 MG capsule TAKE 1 CAPSULE BY MOUTH EVERYDAY AT BEDTIME 90 capsule 3   glucose blood (ACCU-CHEK GUIDE) test strip Check sugar once daily. DX E11.40 100 strip 3   lisinopril (ZESTRIL) 5 MG tablet TAKE 1 TABLET (5 MG TOTAL) BY MOUTH DAILY. 90 tablet 0   metFORMIN (GLUCOPHAGE) 1000 MG tablet TAKE 1 TABLET (1,000 MG TOTAL) BY MOUTH 2 (TWO) TIMES DAILY WITH A MEAL. 180 tablet 0   OZEMPIC, 0.25 OR 0.5 MG/DOSE, 2 MG/1.5ML SOPN INJECT 0.5 MG INTO THE SKIN ONCE A WEEK. 4.5 mL 3   No facility-administered medications prior to visit.     Per HPI unless specifically indicated in ROS section below Review of Systems  Objective:  BP 128/70    Pulse 69    Temp 97.9 F (36.6 C) (Temporal)    Ht 5' 5.75" (1.67 m)    Wt 179 lb (81.2 kg)    SpO2 96%    BMI 29.11 kg/m   Wt Readings from Last 3 Encounters:  07/17/21 179  lb (81.2 kg)  10/25/20 180 lb 1 oz (81.7 kg)  07/04/20 178 lb 1 oz (80.8 kg)      Physical Exam Vitals and nursing note reviewed.  Constitutional:      Appearance: Normal appearance. He is not ill-appearing.  Eyes:     Extraocular Movements: Extraocular movements intact.     Conjunctiva/sclera: Conjunctivae normal.     Pupils: Pupils are equal, round, and reactive to light.  Cardiovascular:     Rate and Rhythm: Normal rate and regular rhythm.     Pulses: Normal pulses.     Heart sounds: Normal heart sounds. No murmur heard. Pulmonary:     Effort: Pulmonary effort is normal. No respiratory distress.     Breath sounds: Normal breath sounds. No wheezing, rhonchi or rales.  Musculoskeletal:     Right lower leg: No edema.      Left lower leg: No edema.     Comments: See HPI for foot exam if done  Skin:    General: Skin is warm and dry.     Findings: No rash.  Neurological:     Mental Status: He is alert.  Psychiatric:        Mood and Affect: Mood normal.        Behavior: Behavior normal.      Results for orders placed or performed in visit on 07/17/21  POCT glycosylated hemoglobin (Hb A1C)  Result Value Ref Range   Hemoglobin A1C 7.9 (A) 4.0 - 5.6 %   HbA1c POC (<> result, manual entry)     HbA1c, POC (prediabetic range)     HbA1c, POC (controlled diabetic range)      Assessment & Plan:  This visit occurred during the SARS-CoV-2 public health emergency.  Safety protocols were in place, including screening questions prior to the visit, additional usage of staff PPE, and extensive cleaning of exam room while observing appropriate contact time as indicated for disinfecting solutions.   Problem List Items Addressed This Visit     Type 2 diabetes mellitus with other specified complication ([place]) - Primary    Chronic, deteriorated control based on recent A1c - in setting of holiday diet and few missed ozempic doses. Now back on ozempic - will continue to monitor. Encouraged renewed efforts to follow diabetic diet. Reassess control at CPE in 4-6 months.       Relevant Orders   POCT glycosylated hemoglobin (Hb A1C) (Completed)   Basic metabolic panel   Hyperlipidemia associated with type 2 diabetes mellitus (HCC)    Statin intolerance. Update d LDL The 10-year ASCVD risk score (Arnett DK, et al., 2019) is: 17%   Values used to calculate the score:     Age: 36 years     Sex: Male     Is Non-Hispanic African American: No     Diabetic: Yes     Tobacco smoker: No     Systolic Blood Pressure: 540 mmHg     Is BP treated: No     HDL Cholesterol: 53.4 mg/dL     Total Cholesterol: 168 mg/dL       Relevant Orders   LDL Cholesterol, Direct   Thalassemia minor    Update cbc.       Relevant Orders   CBC  with Differential/Platelet   Statin intolerance   Other Visit Diagnoses     Need for influenza vaccination       Relevant Orders   Flu Vaccine QUAD 46moIM (Fluarix, Fluzone & Alfiuria  Quad PF) (Completed)   Special screening for malignant neoplasms, colon       Relevant Orders   Fecal occult blood, imunochemical        No orders of the defined types were placed in this encounter.  Orders Placed This Encounter  Procedures   Fecal occult blood, imunochemical    Standing Status:   Future    Standing Expiration Date:   07/17/2022   Flu Vaccine QUAD 4moIM (Fluarix, Fluzone & Alfiuria Quad PF)   CBC with Differential/Platelet   Basic metabolic panel   LDL Cholesterol, Direct   POCT glycosylated hemoglobin (Hb A1C)     Patient Instructions  Flu shot today.  Labs today, pick up stool kit from the lab.  Schedule eye exam.  Check to see how much continuous glucose monitor would cost with new insurance (Dexcom or freestyle libre 3).  Return in 4-6 months for physical.   Follow up plan: Return in about 4 months (around 11/14/2021) for annual exam, prior fasting for blood work.  JRia Bush MD

## 2021-07-17 NOTE — Patient Instructions (Addendum)
Flu shot today.  Labs today, pick up stool kit from the lab.  Schedule eye exam.  Check to see how much continuous glucose monitor would cost with new insurance (Dexcom or freestyle libre 3).  Return in 4-6 months for physical.

## 2021-07-17 NOTE — Telephone Encounter (Signed)
PA approved. (See 07/10/21 phn note)  Called to make [facility] aware.  States pt has already picked up rx.  Denied rx request.

## 2021-10-08 ENCOUNTER — Other Ambulatory Visit: Payer: Self-pay | Admitting: Family Medicine

## 2021-10-09 ENCOUNTER — Encounter: Payer: Self-pay | Admitting: Family Medicine

## 2021-10-10 ENCOUNTER — Telehealth: Payer: Self-pay | Admitting: Family Medicine

## 2021-10-10 MED ORDER — OZEMPIC (0.25 OR 0.5 MG/DOSE) 2 MG/1.5ML ~~LOC~~ SOPN: 0.5000 mg | PEN_INJECTOR | SUBCUTANEOUS | 2 refills | Status: DC

## 2021-10-10 NOTE — Telephone Encounter (Signed)
[facility]  (See Pt Msg, 10/09/21) ?

## 2021-10-10 NOTE — Telephone Encounter (Signed)
Spoke with [facility] asking about rx.  States there is a newer version/concentration of Ozempic so new rx is needed...same instructions, same qty.  ? ?E-scribed new rx. Notified pt.  ?

## 2021-10-10 NOTE — Telephone Encounter (Signed)
?  Encourage patient to contact the pharmacy for refills or they can request refills through [facility] ? ?LAST APPOINTMENT DATE:  Please schedule appointment if longer than 1 year ? ?NEXT APPOINTMENT DATE: ? ?MEDICATION:OZEMPIC, 0.25 OR 0.5 MG/DOSE, 2 MG/1.5ML SOPN ? ?Is the patient out of medication?  ? ?[id number] [place] [facility] [address] ? ?Let patient know to contact pharmacy at the end of the day to make sure medication is ready. ? ?Please notify patient to allow 48-72 hours to process ? ?CLINICAL FILLS OUT ALL BELOW:  ? ?LAST REFILL: ? ?QTY: ? ?REFILL DATE: ? ? ? ?OTHER COMMENTS:  ? ? ?Okay for refill? ? ?Please advise ? ? ?  ?

## 2021-10-17 ENCOUNTER — Encounter: Payer: Self-pay | Admitting: Family Medicine

## 2021-11-12 ENCOUNTER — Other Ambulatory Visit: Payer: Self-pay | Admitting: Family Medicine

## 2021-11-14 ENCOUNTER — Encounter: Payer: 59 | Admitting: Family Medicine

## 2021-12-23 IMAGING — CT CT ANGIO NECK
1 of 6 series · 6 of 27 positions shown · IV contrast (omnipaque)
Comparison: None.

CLINICAL DATA: Pulsatile tinnitus, bilateral.

EXAM:
CT ANGIOGRAPHY HEAD AND NECK
TECHNIQUE: Multidetector CT imaging of the head and neck was performed using
the standard protocol during bolus administration of intravenous
contrast. Multiplanar CT image reconstructions and MIPs were
obtained to evaluate the vascular anatomy. Carotid stenosis
measurements (when applicable) are obtained utilizing NASCET
criteria, using the distal internal carotid diameter as the
denominator.
CONTRAST:  75mL OMNIPAQUE IOHEXOL 350 MG/ML SOLN

[Series 12: ax thin · axial · 0.39mm/px · z∈[-252,-10]mm · 6 of 340 slices shown]
[im 49/340  soft-tissue]
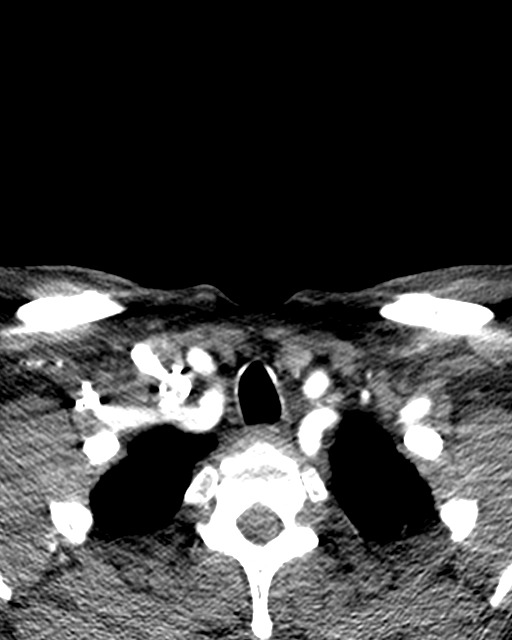
[im 97/340  bone]
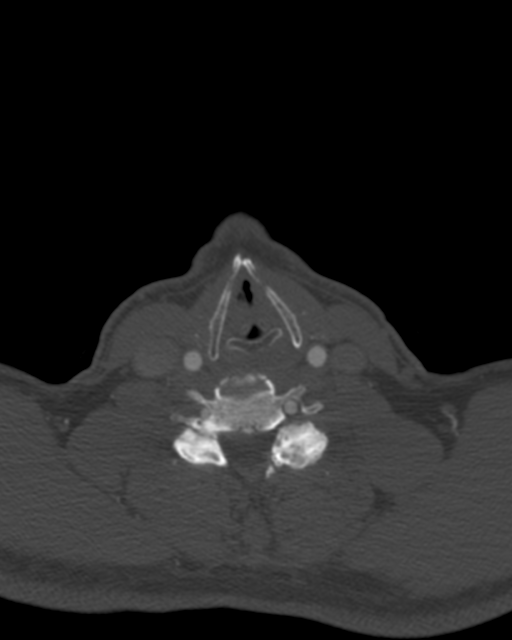
[im 146/340  soft-tissue]
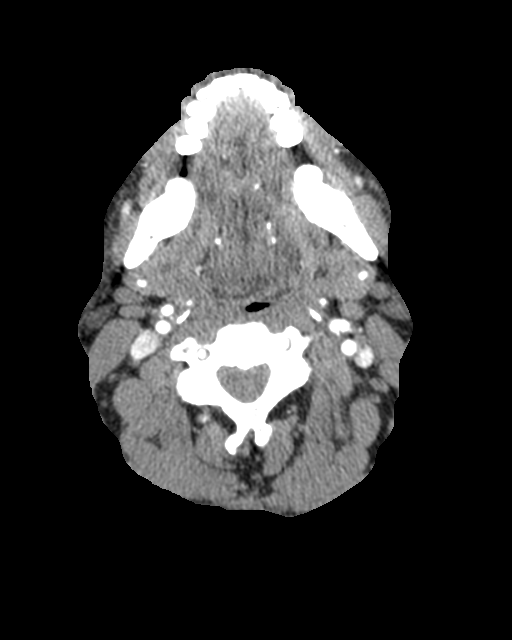
[im 194/340  bone]
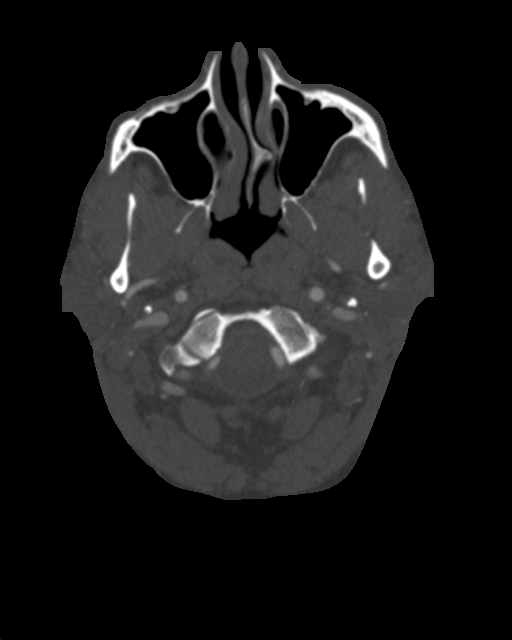
[im 243/340  soft-tissue]
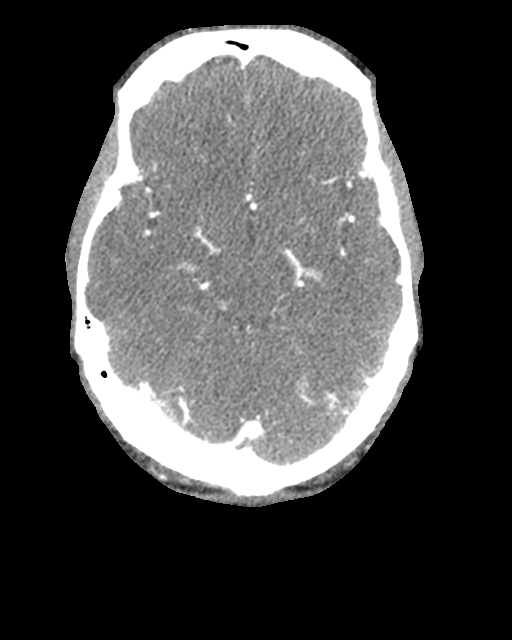
[im 291/340  bone]
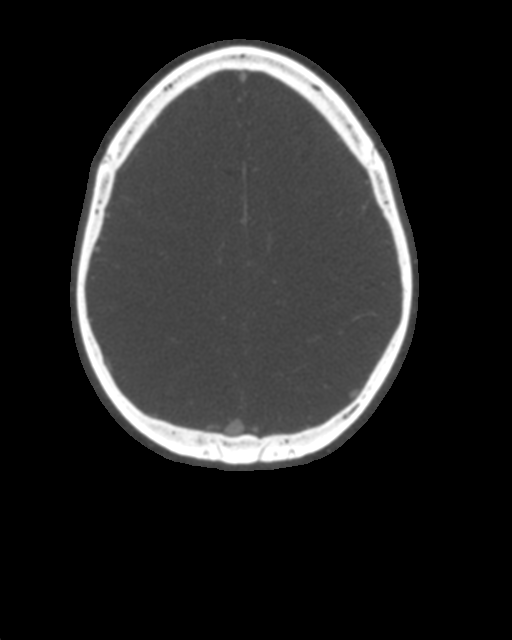

[6 of 27 positions shown; findings below may reference images not displayed]

FINDINGS: CT HEAD FINDINGS

Brain: No evidence of infarction, hemorrhage, hydrocephalus,
extra-axial collection or mass lesion/mass effect.

Vascular: See below

Skull: Unremarkable

Sinuses: Imaged portions are clear.

Orbits: Negative

Review of the MIP images confirms the above findings

CTA NECK FINDINGS

Aortic arch: Normal

Right carotid system: Vessels are smooth and widely patent.

Left carotid system: Vessels are smooth and widely patent. No
calcified or definite plaque.

Vertebral arteries: No proximal subclavian stenosis. Left dominant
vertebral artery. Both vertebral arteries are smooth and widely
patent to the dura.

Skeleton: Degenerative facet spurring. No acute or aggressive
finding.

Other neck: Negative

Upper chest: Negative

Review of the MIP images confirms the above findings

CTA HEAD FINDINGS

Anterior circulation: No stenosis, beading, or dissection. Negative
for aneurysm or signs of vascular malformation/fistula.

Posterior circulation: No stenosis, beading, or dissection. Negative
for aneurysm or signs of vascular malformation/fistula.

Venous sinuses: Diffusely patent

Anatomic variants: None significant

Review of the MIP images confirms the above findings
IMPRESSION: Negative CTA.  No explanation for tinnitus.

## 2022-01-27 ENCOUNTER — Other Ambulatory Visit: Payer: Self-pay | Admitting: Family Medicine

## 2022-01-27 DIAGNOSIS — Z125 Encounter for screening for malignant neoplasm of prostate: Secondary | ICD-10-CM

## 2022-01-27 DIAGNOSIS — E538 Deficiency of other specified B group vitamins: Secondary | ICD-10-CM

## 2022-01-27 DIAGNOSIS — Z85528 Personal history of other malignant neoplasm of kidney: Secondary | ICD-10-CM

## 2022-01-27 DIAGNOSIS — E1169 Type 2 diabetes mellitus with other specified complication: Secondary | ICD-10-CM

## 2022-01-30 ENCOUNTER — Other Ambulatory Visit: Payer: 59

## 2022-02-06 ENCOUNTER — Encounter: Payer: 59 | Admitting: Family Medicine

## 2022-02-20 ENCOUNTER — Telehealth: Payer: Self-pay

## 2022-02-20 ENCOUNTER — Encounter: Payer: Self-pay | Admitting: Family Medicine

## 2022-02-20 NOTE — Telephone Encounter (Signed)
Prior auth started for Ozempic (0.25 or 0.5 MG/DOSE) '2MG'$ /3ML pen-injectors. Blake Manning (Key: [id number])  Waiting for determination.

## 2022-02-20 NOTE — Telephone Encounter (Addendum)
Blake Manning, will you submit a PA with pt's new insurance plz and document in a new phn note?  Also, forwarding message to front office to upload pt's new insurance info.

## 2022-02-21 ENCOUNTER — Encounter: Payer: Self-pay | Admitting: Family Medicine

## 2022-02-21 NOTE — Telephone Encounter (Signed)
Prior auth for Ozempic (0.25 or 0.5 MG/DOSE) '2MG'$ /3ML pen-injectors has been denied. Myran Eppes (Key: [id number])  Coverage for this medication is denied for the following reason(s). We reviewed the information we received about your condition and circumstances. We used the plan approved policy when making this decision. The policy states that this medication may be approved when: -The member has a clinical condition or needs a specific dosage form for which there is no alternative on the formulary OR -The listed formulary alternatives are not recommended based on published guidelines or clinical literature OR -The formulary alternatives will likely be ineffective or less effective for the member OR -The formulary alternatives will likely cause an adverse effect OR -The member is unable to take the required number of formulary alternatives for the given diagnosis due to a trial and inadequate treatment response or contraindication OR -The member has tried and failed the required number of formulary alternatives. Based on the policy and the information we have, your request is denied. We did not receive any documentation that you meet any of the criteria outlined above. Formulary alternative(s) are Trulicity, Victoza. Requirement: 3 in a class with 3 or more alternatives, 2 in a class with 2 alternatives, or 1 in a class with only 1 alternative. Please refer to your plan documents for a complete list of alternatives.  Denial letter sent to scanning.

## 2022-02-22 MED ORDER — TRULICITY 0.75 MG/0.5ML ~~LOC~~ SOAJ: 0.7500 mg | SUBCUTANEOUS | 5 refills | Status: DC

## 2022-02-22 NOTE — Telephone Encounter (Signed)
Lvm asking pt to call back.  Need to relay Dr. G's message.  

## 2022-02-22 NOTE — Addendum Note (Signed)
Addended by: Ria Bush on: 02/22/2022 07:22 AM   Modules accepted: Orders

## 2022-02-22 NOTE — Telephone Encounter (Addendum)
Plz notify pt Ozempic denied by insurance, but trulicity should be covered. This is similar medicine in same family. I've sent trulicity to his pharmacy to check oncoverage.  Take 0.'75mg'$  weekly for now, if tolerating well to let us know and we can titrate dose after 1st month.  Let us know if any trouble with this.

## 2022-02-25 NOTE — Telephone Encounter (Signed)
Lvm asking pt to call back.  Need to relay Dr. G's message.  

## 2022-02-27 ENCOUNTER — Telehealth: Payer: Self-pay

## 2022-02-27 NOTE — Telephone Encounter (Signed)
Prior auth started for Trulicity 0.'75MG'$ /0.5ML pen-injectors. Gwyndolyn Saxon [facility] Key: [id number] - PA Case ID: [id number] - Rx #: [id number] Waiting for determination.

## 2022-02-27 NOTE — Telephone Encounter (Signed)
Lvm asking pt to call back.  Need to relay Dr. G's message.   Mailing a letter.  

## 2022-02-28 NOTE — Telephone Encounter (Signed)
Prior auth for Trulicity 0.$RemoveBeforeD'75MG'NngtaTMAEzScZp$ /0.5ML pen-injectors has been denied. Gwyndolyn Saxon [facility] Key: [id number] - PA Case ID: [id number] - Rx #: [id number]  [facility], the utilization review entity for [facility] [place] [employer], received a request for coverage of TRULICITY INJ 7.37/1.0 for you. We've denied the request for the following reason(s): You do not meet the requirements of your plan. Your plan covers this drug when you meet any of these conditions: - You tried the alternate drug(s) while covered by the current or the previous health benefit plan - The alternate drug(s) caused or is reasonably expected to cause an adverse reaction for you - You tried the alternate drug(s) and it did not work for you  The requested drug will be covered with prior authorization when the following criteria are met:  The requested drug is being prescribed for an FDA-approved indication or an indication supported in the compendia of current literature (examples: AHFS, Micromedex, current accepted guidelines) AND  The prescribed dose and quantity fall within the FDA-approved labeling or within dosing guidelines found in the compendia of current literature AND  The requested drug is a brand drug that has a generic equivalent or biosimilar available on formulary AND o The patient had a trial and failure of the generic equivalent or biosimilar due to an adverse event (examples: rash, nausea, vomiting, anaphylaxis) that is thought to be due to an inactive ingredient OR  The patient tried the alternate drug(s) while covered by the current or the previous health benefit plan OR  The alternate drug(s) caused or is reasonably expected to cause a harmful or adverse clinical reaction in the patient OR  The patient previously used the alternate drug(s) and it has been detrimental to the patient's health or has been ineffective in treating the same condition and is likely to be detrimental to the  patient's health or ineffective in treating the condition again  Denial letter sent to scanning.

## 2022-03-05 ENCOUNTER — Telehealth: Payer: Self-pay

## 2022-03-05 MED ORDER — SEMAGLUTIDE(0.25 OR 0.5MG/DOS) 2 MG/3ML ~~LOC~~ SOPN: 0.5000 mg | PEN_INJECTOR | SUBCUTANEOUS | 3 refills | Status: DC

## 2022-03-05 NOTE — Telephone Encounter (Signed)
I spoke to [place], who stated there are 4 alternatives to Trulicity.  Initially, she said he would not be covered for any of the four prescriptions, then she said that he would be covered if we did a prior auth for any of the four.  The four alternatives I was given are:  Victoza '18mg'$ /51m, Ozempic '2mg'$ /353m Bydureon BC 2/.85103mByetta 5 mcg.

## 2022-03-05 NOTE — Telephone Encounter (Addendum)
Based on below reasons, the Trulicity should be covered. Is there a list of alternate drugs recommended by insurance? Can we do an appeal?  Would make sure we included in PA that he is currently on full dose metformin and it has been ineffective to control diabetes. That is why I'm prescribing Trulicity (as ozempic was not on formulary). He was previously on ozempic and tolerated well, and it was effective.

## 2022-03-05 NOTE — Telephone Encounter (Signed)
Prior auth started for Ozempic (0.25 or 0.5 MG/DOSE) '2MG'$ /3ML pen-injectors. Blake Manning (Key: [id number]) Waiting for determination.

## 2022-03-05 NOTE — Addendum Note (Signed)
Addended by: Ria Bush on: 03/05/2022 02:06 PM   Modules accepted: Orders

## 2022-03-05 NOTE — Telephone Encounter (Addendum)
Looks like patient now has new insurance.  Sounds like Ozempic should now be covered.  I have sent in ozempic to his pharmacy to restart 0.'5mg'$  weekly.  I have sent patient a MyChart message.  Plz do PA for ozempic.   He was initially on ozempic and tolerating well. He is on full dose metformin, unable to attain glycemic control only on metformin.

## 2022-03-06 NOTE — Telephone Encounter (Signed)
Prior auth for Ozempic (0.25 or 0.5 MG/DOSE) '2MG'$ /3ML pen-injectors has been denied. Gwyndolyn Saxon S[place]hecki (Key: [id number])  I called [employer] to ask which alternatives need to be tried, spoke to [place].  She said one of the formulary alternatives is Victoza, and that a PA would still need to be done for this drug.  She said you could either try prescribing that or going ahead with an appeal.  Coverage for this medication is denied for the following reason(s). We reviewed the information we received about your condition and circumstances. We used the plan approved policy when making this decision. The policy states that this medication may be approved when: -The member has a clinical condition or needs a specific dosage form for which there is no alternative on the formulary OR -The listed formulary alternatives are not recommended based on published guidelines or clinical literature OR -The formulary alternatives will likely be ineffective or less effective for the member OR -The formulary alternatives will likely cause an adverse effect OR -The member is unable to take the required number of formulary alternatives for the given diagnosis due to a trial and inadequate treatment response or contraindication OR -The member has tried and failed the required number of formulary alternatives  Denial letter sent to scanning.

## 2022-03-08 NOTE — Telephone Encounter (Signed)
I called [facility] to discuss starting an appeal with them for Trulicity, spoke to [place].  Because Trulicity is on their formulary list, she was able to do an appeal over the phone for a 24-72 hour response time.  I have faxed in a letter of medical necessity along with lab results and last office note for documentation to fax # [phone].  Appeal reference # [id number].  Received fax confirmation.  If this appeal is not approved, we can either appeal the Ozempic prescription, which would take a 30 day response time, or we could prescribe Victoza 18 mg/3 ml pens.

## 2022-03-13 NOTE — Telephone Encounter (Signed)
Appeal for Trulicity 9.55OP/1.6FO pen-injectors has been denied per letter from [place].   The requested drug will be covered with prior authorization  when the following criteria are met: *The requested drug is being prescribed for an FDA-approved indication or an indication supported in the compendia of current literature AND *The prescribed dose and quantity fall within the FDA-approved labeling or within dosing guidelines found in the compendia of current literature AND *The requested drug is a brand drug that has a generic equivalent or biosimilar available on formulary AND  *The patient had a trial and failure of the generic equivalent or biosimilar due to an adverse event that is thought to be due to an inactive ingredient OR *The patient tried the alternate drug(s) while covered by the current or the previous health benefit plan OR *The alternate drug(s) cause or is reasonably expected to cause a harmful or adverse clinical reaction in the patient OR *The patient previously used the alternate drug(s) and it has been detrimental to the patient's health or has been ineffective in treating the same condition and is likely to be detrimental to the patient's health or ineffective in treating the condition again  Denial letter sent to scanning.

## 2022-03-14 MED ORDER — TRULICITY 0.75 MG/0.5ML ~~LOC~~ SOAJ: 0.7500 mg | SUBCUTANEOUS | 1 refills | Status: DC

## 2022-03-14 NOTE — Addendum Note (Signed)
Addended by: Ria Bush on: 03/14/2022 08:46 PM   Modules accepted: Orders

## 2022-03-14 NOTE — Telephone Encounter (Addendum)
Can we update pt's insurance? It's still showing as [employer]. Thanks.

## 2022-03-14 NOTE — Telephone Encounter (Addendum)
We've been working on PA for medication for patient for over 3 weeks now, with continual denials although it seems it should be approved.  When patient called insurance he was told trulicty would be covered as a step therapy given he's already on metformin.  I would like to file an expedited appeal and have written another letter that we can send in. It's in the chart.

## 2022-03-15 NOTE — Telephone Encounter (Signed)
I called [facility] [phone], spoke to [facility].  We can send a letter of medical necessity and patient chart documentation to F# [phone] to request an expedited appeal.  I also called [facility] [phone], spoke to [facility], because I was told that [employer] [facility] would process the appeal.  I was told to fax all documentation to [phone].  Dena stated that they have been having an issue lately, there is a disconnect with the medication side and they are not receiving documentation like they should.  This may be the problem as to why the initial request was denied for Trulicity.  All info has been faxed to both F#[phone] and [phone].  Received fax confirmation from both fax #s.  Waiting for determination.

## 2022-03-18 NOTE — Telephone Encounter (Signed)
Faxed form to F# [phone].  Received fax confirmation.  Waiting for determination.

## 2022-03-18 NOTE — Telephone Encounter (Signed)
Filled and returned to [place].

## 2022-03-18 NOTE — Telephone Encounter (Signed)
I received another form for prior auth for Trulicity for Blake Manning.  This was faxed on 03/15/22.  I have placed it in your inbox for completion and signing.  I have already requested the expedited appeal, but I will go ahead and send this in once you have completed, in case it helps.

## 2022-03-20 LAB — HM DIABETES EYE EXAM

## 2022-03-21 ENCOUNTER — Encounter: Payer: Self-pay | Admitting: Family Medicine

## 2022-03-22 NOTE — Telephone Encounter (Signed)
I called [facility], spoke to Vicente Males, to check on status of appeal.  She said she did not see an appeal, so I called the expedited appeal line at ph#[phone] to leave all info for appeal.  I received an immediate call back from [employer], spoke to [place].  She stated they have received the appeal, but it does not meet the expedited appeal criteria.  There will be a panel review per [place] rules.  We will receive a letter, and about 15 days from the date of the letter, we will be able to "attend" the panel review to give any pertinent information.  Once the panel review occurs, the decision is made quickly and we will receive a fast response on the appeal.

## 2022-03-25 NOTE — Telephone Encounter (Signed)
Noted  

## 2022-03-25 NOTE — Telephone Encounter (Signed)
Can we call Mr [facility] and let him know? Thanks.

## 2022-03-25 NOTE — Telephone Encounter (Signed)
I called Mr [facility] to notify him of where we are in the appeal process.  He understands.  He also called his insurance company to notify them that this could potentially cause health issues in the future due to not being on this medication.

## 2022-03-25 NOTE — Telephone Encounter (Signed)
See chart. We are in process of "appeal panel"

## 2022-03-27 ENCOUNTER — Other Ambulatory Visit: Payer: Self-pay | Admitting: Family Medicine

## 2022-03-27 NOTE — Telephone Encounter (Signed)
We received a letter from [place] of Appeal Request.  The appeal was received on 03/15/22.  They will send a written response within 30 calendar days.  I left a voicemail for Geni Bers, phone # [phone], to verify that they have all information needed for appeal to process.  Copy of letter was sent to scanning.

## 2022-04-02 NOTE — Telephone Encounter (Signed)
I received a second letter from [place] about this appeal.  This letter states that the decision will be made 15 days from the date of the requested appeal.  I called [employer] to see which letter was valid, the 30 days or 15 days for decision.  I spoke to [place] [facility] at [facility] provider line, ph # [phone].  She stated she sees where they are reviewing the case currently, but no decision has been made yet.  She said the 15 day response time may have been a clerical error.  She said she still shows that the decision will be made by Oct 8th, 30 days after date of receipt of appeal.

## 2022-04-11 NOTE — Telephone Encounter (Signed)
Prior authorization appeal for Trulicity has been approved.  I spoke to [place] at [facility] provider line [phone], who stated the original decision has been overturned, and the appeal has been approved.  Reference # [id number].  Charlena Cross is faxing a copy of the determination to F# [phone].  Patient has been notified via mychart.  Left vmail for patient that info is in [place].

## 2022-04-29 ENCOUNTER — Other Ambulatory Visit (INDEPENDENT_AMBULATORY_CARE_PROVIDER_SITE_OTHER): Payer: 59

## 2022-04-29 DIAGNOSIS — E1169 Type 2 diabetes mellitus with other specified complication: Secondary | ICD-10-CM

## 2022-04-29 DIAGNOSIS — Z1211 Encounter for screening for malignant neoplasm of colon: Secondary | ICD-10-CM

## 2022-04-29 DIAGNOSIS — Z125 Encounter for screening for malignant neoplasm of prostate: Secondary | ICD-10-CM | POA: Diagnosis not present

## 2022-04-29 DIAGNOSIS — R7989 Other specified abnormal findings of blood chemistry: Secondary | ICD-10-CM

## 2022-04-29 DIAGNOSIS — E785 Hyperlipidemia, unspecified: Secondary | ICD-10-CM | POA: Diagnosis not present

## 2022-04-29 DIAGNOSIS — Z85528 Personal history of other malignant neoplasm of kidney: Secondary | ICD-10-CM

## 2022-04-29 LAB — COMPREHENSIVE METABOLIC PANEL
ALT: 31 U/L (ref 0–53)
AST: 19 U/L (ref 0–37)
Albumin: 4.6 g/dL (ref 3.5–5.2)
Alkaline Phosphatase: 58 U/L (ref 39–117)
BUN: 17 mg/dL (ref 6–23)
CO2: 27 mEq/L (ref 19–32)
Calcium: 9.9 mg/dL (ref 8.4–10.5)
Chloride: 102 mEq/L (ref 96–112)
Creatinine, Ser: 1.03 mg/dL (ref 0.40–1.50)
GFR: 77.01 mL/min (ref >60.00)
Glucose, Bld: 183 mg/dL — ABNORMAL HIGH (ref 70–99)
Potassium: 4.5 mEq/L (ref 3.5–5.1)
Sodium: 137 mEq/L (ref 135–145)
Total Bilirubin: 0.6 mg/dL (ref 0.2–1.2)
Total Protein: 7.3 g/dL (ref 6.0–8.3)

## 2022-04-29 LAB — LIPID PANEL
Cholesterol: 178 mg/dL (ref 0–200)
HDL: 48.2 mg/dL (ref >39.00)
LDL Cholesterol: 104 mg/dL — ABNORMAL HIGH (ref 0–99)
NonHDL: 130.11
Total CHOL/HDL Ratio: 4
Triglycerides: 132 mg/dL (ref 0.0–149.0)
VLDL: 26.4 mg/dL (ref 0.0–40.0)

## 2022-04-29 LAB — PSA: PSA: 0.17 ng/mL (ref 0.10–4.00)

## 2022-04-29 LAB — URINALYSIS, ROUTINE W REFLEX MICROSCOPIC
Bilirubin Urine: NEGATIVE
Hgb urine dipstick: NEGATIVE
Leukocytes,Ua: NEGATIVE
Nitrite: NEGATIVE
RBC / HPF: NONE SEEN (ref 0–?)
Specific Gravity, Urine: 1.02 (ref 1.000–1.030)
Total Protein, Urine: NEGATIVE
Urine Glucose: 500 — AB
Urobilinogen, UA: 0.2 (ref 0.0–1.0)
pH: 5.5 (ref 5.0–8.0)

## 2022-04-29 LAB — VITAMIN B12: Vitamin B-12: 939 pg/mL — ABNORMAL HIGH (ref 211–911)

## 2022-04-29 LAB — FECAL OCCULT BLOOD, IMMUNOCHEMICAL: Fecal Occult Bld: NEGATIVE

## 2022-04-29 LAB — MICROALBUMIN / CREATININE URINE RATIO
Creatinine,U: 97 mg/dL
Microalb Creat Ratio: 0.9 mg/g (ref 0.0–30.0)
Microalb, Ur: 0.8 mg/dL (ref 0.0–1.9)

## 2022-04-29 LAB — HEMOGLOBIN A1C: Hgb A1c MFr Bld: 9.1 % — ABNORMAL HIGH (ref 4.6–6.5)

## 2022-05-01 ENCOUNTER — Encounter: Payer: Self-pay | Admitting: Family Medicine

## 2022-05-01 ENCOUNTER — Ambulatory Visit (INDEPENDENT_AMBULATORY_CARE_PROVIDER_SITE_OTHER): Payer: 59 | Admitting: Family Medicine

## 2022-05-01 VITALS — BP 136/80 | HR 63 | Temp 97.7°F | Ht 65.5 in | Wt 175.5 lb

## 2022-05-01 DIAGNOSIS — Z0000 Encounter for general adult medical examination without abnormal findings: Secondary | ICD-10-CM | POA: Diagnosis not present

## 2022-05-01 DIAGNOSIS — E785 Hyperlipidemia, unspecified: Secondary | ICD-10-CM

## 2022-05-01 DIAGNOSIS — Z789 Other specified health status: Secondary | ICD-10-CM

## 2022-05-01 DIAGNOSIS — Z23 Encounter for immunization: Secondary | ICD-10-CM | POA: Diagnosis not present

## 2022-05-01 DIAGNOSIS — Z7189 Other specified counseling: Secondary | ICD-10-CM

## 2022-05-01 DIAGNOSIS — Z85528 Personal history of other malignant neoplasm of kidney: Secondary | ICD-10-CM

## 2022-05-01 DIAGNOSIS — E1136 Type 2 diabetes mellitus with diabetic cataract: Secondary | ICD-10-CM | POA: Diagnosis not present

## 2022-05-01 DIAGNOSIS — E1169 Type 2 diabetes mellitus with other specified complication: Secondary | ICD-10-CM | POA: Diagnosis not present

## 2022-05-01 DIAGNOSIS — K760 Fatty (change of) liver, not elsewhere classified: Secondary | ICD-10-CM | POA: Diagnosis not present

## 2022-05-01 DIAGNOSIS — R7989 Other specified abnormal findings of blood chemistry: Secondary | ICD-10-CM

## 2022-05-01 MED ORDER — LISINOPRIL 5 MG PO TABS: 5.0000 mg | ORAL_TABLET | Freq: Every day | ORAL | 3 refills | Status: DC

## 2022-05-01 MED ORDER — EZETIMIBE 10 MG PO TABS: 10.0000 mg | ORAL_TABLET | Freq: Every day | ORAL | 11 refills | Status: DC

## 2022-05-01 MED ORDER — TRULICITY 0.75 MG/0.5ML ~~LOC~~ SOAJ: 1.5000 mg | SUBCUTANEOUS | 3 refills | Status: DC

## 2022-05-01 MED ORDER — METFORMIN HCL 1000 MG PO TABS: 1000.0000 mg | ORAL_TABLET | Freq: Two times a day (BID) | ORAL | 3 refills | Status: DC

## 2022-05-01 MED ORDER — GABAPENTIN 300 MG PO CAPS: 300.0000 mg | ORAL_CAPSULE | Freq: Every day | ORAL | 3 refills | Status: DC

## 2022-05-01 NOTE — Telephone Encounter (Signed)
Received appeal determination. They have approved Trulicity.  I do see where new script was given 10/25. Do we need to call patient and let know approved or has he received that information in office?

## 2022-05-01 NOTE — Assessment & Plan Note (Signed)
Levels well repleted with daily b12.

## 2022-05-01 NOTE — Assessment & Plan Note (Signed)
UA normal.

## 2022-05-01 NOTE — Patient Instructions (Addendum)
Flu shot today. Tdap today.  Work on advanced directive, bring [place] a copy.  Try zetia for cholesterol Increase trulicity to 1.'5mg'$  weekly - new dose sent to pharmacy.  Return in 3 months for lab visit only (A1c) Return in 6 months for diabetes follow up visit.   Health Maintenance, Male Adopting a healthy lifestyle and getting preventive care are important in promoting health and wellness. Ask your health care provider about: The right schedule for you to have regular tests and exams. Things you can do on your own to prevent diseases and keep yourself healthy. What should I know about diet, weight, and exercise? Eat a healthy diet  Eat a diet that includes plenty of vegetables, fruits, low-fat dairy products, and lean protein. Do not eat a lot of foods that are high in solid fats, added sugars, or sodium. Maintain a healthy weight Body mass index (BMI) is a measurement that can be used to identify possible weight problems. It estimates body fat based on height and weight. Your health care provider can help determine your BMI and help you achieve or maintain a healthy weight. Get regular exercise Get regular exercise. This is one of the most important things you can do for your health. Most adults should: Exercise for at least 150 minutes each week. The exercise should increase your heart rate and make you sweat (moderate-intensity exercise). Do strengthening exercises at least twice a week. This is in addition to the moderate-intensity exercise. Spend less time sitting. Even light physical activity can be beneficial. Watch cholesterol and blood lipids Have your blood tested for lipids and cholesterol at 64 years of age, then have this test every 5 years. You may need to have your cholesterol levels checked more often if: Your lipid or cholesterol levels are high. You are older than 64 years of age. You are at high risk for heart disease. What should I know about cancer screening? Many  types of cancers can be detected early and may often be prevented. Depending on your health history and family history, you may need to have cancer screening at various ages. This may include screening for: Colorectal cancer. Prostate cancer. Skin cancer. Lung cancer. What should I know about heart disease, diabetes, and high blood pressure? Blood pressure and heart disease High blood pressure causes heart disease and increases the risk of stroke. This is more likely to develop in people who have high blood pressure readings or are overweight. Talk with your health care provider about your target blood pressure readings. Have your blood pressure checked: Every 3-5 years if you are 76-26 years of age. Every year if you are 77 years old or older. If you are between the ages of 74 and 25 and are a current or former smoker, ask your health care provider if you should have a one-time screening for abdominal aortic aneurysm (AAA). Diabetes Have regular diabetes screenings. This checks your fasting blood sugar level. Have the screening done: Once every three years after age 18 if you are at a normal weight and have a low risk for diabetes. More often and at a younger age if you are overweight or have a high risk for diabetes. What should I know about preventing infection? Hepatitis B If you have a higher risk for hepatitis B, you should be screened for this virus. Talk with your health care provider to find out if you are at risk for hepatitis B infection. Hepatitis C Blood testing is recommended for: Everyone born  from [place] through 1965. Anyone with known risk factors for hepatitis C. Sexually transmitted infections (STIs) You should be screened each year for STIs, including gonorrhea and chlamydia, if: You are sexually active and are younger than 64 years of age. You are older than 64 years of age and your health care provider tells you that you are at risk for this type of infection. Your  sexual activity has changed since you were last screened, and you are at increased risk for chlamydia or gonorrhea. Ask your health care provider if you are at risk. Ask your health care provider about whether you are at high risk for HIV. Your health care provider may recommend a prescription medicine to help prevent HIV infection. If you choose to take medicine to prevent HIV, you should first get tested for HIV. You should then be tested every 3 months for as long as you are taking the medicine. Follow these instructions at home: Alcohol use Do not drink alcohol if your health care provider tells you not to drink. If you drink alcohol: Limit how much you have to 0-2 drinks a day. Know how much alcohol is in your drink. In the [place] one drink equals one 12 oz bottle of beer (355 mL), one 5 oz glass of wine (148 mL), or one 1 oz glass of hard liquor (44 mL). Lifestyle Do not use any products that contain nicotine or tobacco. These products include cigarettes, chewing tobacco, and vaping devices, such as e-cigarettes. If you need help quitting, ask your health care provider. Do not use street drugs. Do not share needles. Ask your health care provider for help if you need support or information about quitting drugs. General instructions Schedule regular health, dental, and eye exams. Stay current with your vaccines. Tell your health care provider if: You often feel depressed. You have ever been abused or do not feel safe at home. Summary Adopting a healthy lifestyle and getting preventive care are important in promoting health and wellness. Follow your health care provider's instructions about healthy diet, exercising, and getting tested or screened for diseases. Follow your health care provider's instructions on monitoring your cholesterol and blood pressure. This information is not intended to replace advice given to you by your health care provider. Make sure you discuss any questions you  have with your health care provider. Document Revised: 11/13/2020 Document Reviewed: 11/13/2020 Elsevier Patient Education  [place].

## 2022-05-01 NOTE — Telephone Encounter (Signed)
Noted.  Spoke with pharmacy to make them aware of PA approval.  Told pt had already picked up med.

## 2022-05-01 NOTE — Assessment & Plan Note (Addendum)
Chronic, deteriorated after he was off [place] for 2 months due to insurance delay. Now on trulicity 0.'75mg'$  weekly - will increase dose to 1.'5mg'$  weekly. Reviewed side effects to monitor for. Continue full dose metformin. RTC 3 mo A1c, 6 mo DM f/u visit

## 2022-05-01 NOTE — Assessment & Plan Note (Signed)
LFTs remain normal.

## 2022-05-01 NOTE — Assessment & Plan Note (Signed)
Advanced directive - has not completed. Would want wife Bethena Roys to be HCPOA. Will work on this, bring [place] copy when completed.

## 2022-05-01 NOTE — Progress Notes (Signed)
Patient ID: Blake Manning, male    DOB: March 22, 1956, 66 y.o.   MRN: [id number]  This visit was conducted in person.  BP 136/80   Pulse 63   Temp 97.7 F (36.5 C) (Temporal)   Ht 5' 5.5" (1.664 m)   Wt 175 lb 8 oz (79.6 kg)   SpO2 97%   BMI 28.76 kg/m    CC: CPE Subjective:   HPI: Blake Manning is a 66 y.o. male presenting on 05/01/2022 for Annual Exam   Wife just found to have breast mass - in process of getting this worked up.   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 Off potassium citrate for kidney stone history. No recent kidney stones.   DM - difficulty getting GLP1RA approved - back and forth with insurance company - this led to being off medication for 2 months. Previously on ozempic, now on trulicity 0.75mg  weekly for the past 4 weeks.   Did not tolerate lovastatin - now off all statins   Preventative: Colonoscopy 2012 @ [place] told all normal - unable to receive records despite multiple attempts. iFOB normal 12/2017, 06/2020, 04/2022.  Prostate cancer screening - previously saw urologist Sheppard Penton) h/o mild enlargement. Nocturia x0-1.  Lung cancer screening - not eligible  Flu shot - today COVID vaccine J&J 11/2019. Pfizer booster 07/2020.  Tetanus shot - 2011, Tdap today.  Pneumovax - 2015.  shingrix - 06/2020, 10/2020.  Advanced directive - has not completed. Would want wife [name] to be HCPOA. Will work on this, bring [place] copy when completed.  Seat belt use discussed  Sunscreen use discussed, no changing moles on skin  Non smoker  Alcohol - none Dentist q6 mo Eye exam yearly   Lives with wife, 1 dog   Grown children   Occupation: Retail banker  Edu: HS Activity: exercises at farm - also physical labor at work. Playing pickle ball. 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  Diet: good water and flavored water, fruits/vegetables daily - sugar free dessert      Relevant past medical, surgical, family and social  history reviewed and updated as indicated. Interim medical history since our last visit reviewed. Allergies and medications reviewed and updated. Outpatient Medications Prior to Visit  Medication Sig Dispense Refill   Accu-Chek FastClix Lancets MISC 1 each by Does not apply route as directed. Use as directed to check blood sugar 102 each 3   aspirin 81 MG EC tablet Take 1 tablet (81 mg total) by mouth daily. Swallow whole.     Blood Glucose Monitoring Suppl (ACCU-CHEK NANO SMARTVIEW) w/Device KIT 1 each by Does not apply route as directed. Use as directed to check blood sugar 1 kit 0   Cyanocobalamin (B-12) 1000 MCG SUBL Place 1 tablet under the tongue daily.     glucose blood (ACCU-CHEK GUIDE) test strip Check sugar once daily. DX E11.40 100 strip 3   Dulaglutide (TRULICITY) 0.75 MG/0.5ML SOPN Inject 0.75 mg into the skin once a week. 6 mL 1   gabapentin (NEURONTIN) 300 MG capsule TAKE 1 CAPSULE BY MOUTH EVERYDAY AT BEDTIME 90 capsule 3   lisinopril (ZESTRIL) 5 MG tablet TAKE 1 TABLET (5 MG TOTAL) BY MOUTH DAILY. 90 tablet 0   metFORMIN (GLUCOPHAGE) 1000 MG tablet TAKE 1 TABLET (1,000 MG TOTAL) BY MOUTH TWICE A DAY WITH FOOD 180 tablet 0   No facility-administered medications prior to visit.     Per HPI unless specifically indicated in  ROS section below Review of Systems  Constitutional:  Negative for activity change, appetite change, chills, fatigue, fever and unexpected weight change.  HENT:  Negative for hearing loss.   Eyes:  Negative for visual disturbance.  Respiratory:  Negative for cough, chest tightness, shortness of breath and wheezing.   Cardiovascular:  Negative for chest pain, palpitations and leg swelling.  Gastrointestinal:  Negative for abdominal distention, abdominal pain, blood in stool, constipation, diarrhea, nausea and vomiting.  Genitourinary:  Negative for difficulty urinating and hematuria.  Musculoskeletal:  Negative for arthralgias, myalgias and neck pain.  Skin:   Negative for rash.  Neurological:  Negative for dizziness, seizures, syncope and headaches.  Hematological:  Negative for adenopathy. Does not bruise/bleed easily.  Psychiatric/Behavioral:  Negative for dysphoric mood. The patient is not nervous/anxious.     Objective:  BP 136/80   Pulse 63   Temp 97.7 F (36.5 C) (Temporal)   Ht 5' 5.5" (1.664 m)   Wt 175 lb 8 oz (79.6 kg)   SpO2 97%   BMI 28.76 kg/m   Wt Readings from Last 3 Encounters:  05/01/22 175 lb 8 oz (79.6 kg)  07/17/21 179 lb (81.2 kg)  10/25/20 180 lb 1 oz (81.7 kg)      Physical Exam Vitals and nursing note reviewed.  Constitutional:      General: He is not in acute distress.    Appearance: Normal appearance. He is well-developed. He is not ill-appearing.  HENT:     Head: Normocephalic and atraumatic.     Right Ear: Hearing, tympanic membrane, ear canal and external ear normal.     Left Ear: Hearing, tympanic membrane, ear canal and external ear normal.  Eyes:     General: No scleral icterus.    Extraocular Movements: Extraocular movements intact.     Conjunctiva/sclera: Conjunctivae normal.     Pupils: Pupils are equal, round, and reactive to light.  Neck:     Thyroid: No thyroid mass or thyromegaly.  Cardiovascular:     Rate and Rhythm: Normal rate and regular rhythm.     Pulses: Normal pulses.          Radial pulses are 2+ on the right side and 2+ on the left side.     Heart sounds: Normal heart sounds. No murmur heard. Pulmonary:     Effort: Pulmonary effort is normal. No respiratory distress.     Breath sounds: Normal breath sounds. No wheezing, rhonchi or rales.  Abdominal:     General: Bowel sounds are normal. There is no distension.     Palpations: Abdomen is soft. There is no mass.     Tenderness: There is no abdominal tenderness. There is no guarding or rebound.     Hernia: No hernia is present.  Musculoskeletal:        General: Normal range of motion.     Cervical back: Normal range of  motion and neck supple.     Right lower leg: No edema.     Left lower leg: No edema.  Lymphadenopathy:     Cervical: No cervical adenopathy.  Skin:    General: Skin is warm and dry.     Findings: No rash.  Neurological:     General: No focal deficit present.     Mental Status: He is alert and oriented to person, place, and time.  Psychiatric:        Mood and Affect: Mood normal.        Behavior: Behavior  normal.        Thought Content: Thought content normal.        Judgment: Judgment normal.       Results for orders placed or performed in visit on 04/29/22  Fecal occult blood, imunochemical   Specimen: Stool  Result Value Ref Range   Fecal Occult Bld Negative Negative  Lipid panel  Result Value Ref Range   Cholesterol 178 0 - 200 mg/dL   Triglycerides 132.0 0.0 - 149.0 mg/dL   HDL 48.20 >39.00 mg/dL   VLDL 26.4 0.0 - 40.0 mg/dL   LDL Cholesterol 104 (H) 0 - 99 mg/dL   Total CHOL/HDL Ratio 4    NonHDL 130.11   Comprehensive metabolic panel  Result Value Ref Range   Sodium 137 135 - 145 mEq/L   Potassium 4.5 3.5 - 5.1 mEq/L   Chloride 102 96 - 112 mEq/L   CO2 27 19 - 32 mEq/L   Glucose, Bld 183 (H) 70 - 99 mg/dL   BUN 17 6 - 23 mg/dL   Creatinine, Ser 1.03 0.40 - 1.50 mg/dL   Total Bilirubin 0.6 0.2 - 1.2 mg/dL   Alkaline Phosphatase 58 39 - 117 U/L   AST 19 0 - 37 U/L   ALT 31 0 - 53 U/L   Total Protein 7.3 6.0 - 8.3 g/dL   Albumin 4.6 3.5 - 5.2 g/dL   GFR 77.01 >60.00 mL/min   Calcium 9.9 8.4 - 10.5 mg/dL  Hemoglobin A1c  Result Value Ref Range   Hgb A1c MFr Bld 9.1 (H) 4.6 - 6.5 %  Microalbumin / creatinine urine ratio  Result Value Ref Range   Microalb, Ur 0.8 0.0 - 1.9 mg/dL   Creatinine,U 97.0 mg/dL   Microalb Creat Ratio 0.9 0.0 - 30.0 mg/g  PSA  Result Value Ref Range   PSA 0.17 0.10 - 4.00 ng/mL  Vitamin B12  Result Value Ref Range   Vitamin B-12 939 (H) 211 - 911 pg/mL  Urinalysis, Routine w reflex microscopic  Result Value Ref Range   Color,  Urine YELLOW Yellow;Lt. Yellow;Straw;Dark Yellow;Amber;Green;Red;Brown   APPearance CLEAR Clear;Turbid;Slightly Cloudy;Cloudy   Specific Gravity, Urine 1.020 1.000 - 1.030   pH 5.5 5.0 - 8.0   Total Protein, Urine NEGATIVE Negative   Urine Glucose 500 (A) Negative   Ketones, ur TRACE (A) Negative   Bilirubin Urine NEGATIVE Negative   Hgb urine dipstick NEGATIVE Negative   Urobilinogen, UA 0.2 0.0 - 1.0   Leukocytes,Ua NEGATIVE Negative   Nitrite NEGATIVE Negative   WBC, UA 0-2/hpf 0-2/hpf   RBC / HPF none seen 0-2/hpf   Mucus, UA Presence of (A) None    Assessment & Plan:   Problem List Items Addressed This Visit     Health maintenance examination - Primary (Chronic)    Preventative protocols reviewed and updated unless pt declined. Discussed healthy diet and lifestyle.       Advanced care planning/counseling discussion (Chronic)    Advanced directive - has not completed. Would want wife Bethena Roys to be HCPOA. Will work on this, bring [place] copy when completed.       History of renal cell cancer    UA normal.       Type 2 diabetes mellitus with other specified complication (HCC)    Chronic, deteriorated after he was off [place] for 2 months due to insurance delay. Now on trulicity 0.$RemoveBefore'75mg'TWSfRHTiHIpSN$  weekly - will increase dose to 1.$RemoveB'5mg'bLMBcvJo$  weekly. Reviewed side effects to monitor for. Continue  full dose metformin. RTC 3 mo A1c, 6 mo DM f/u visit       Relevant Medications   Dulaglutide (TRULICITY) 7.47 BU/0.3JQ SOPN   lisinopril (ZESTRIL) 5 MG tablet   metFORMIN (GLUCOPHAGE) 1000 MG tablet   Other Relevant Orders   POCT glycosylated hemoglobin (Hb A1C)   Hyperlipidemia associated with type 2 diabetes mellitus (HCC)    Statin intolerance. Try zetia $RemoveB'10mg'KyaPRxhR$  daily.  The 10-year ASCVD risk score (Arnett DK, et al., 2019) is: 22.3%   Values used to calculate the score:     Age: 27 years     Sex: Male     Is Non-Hispanic African American: No     Diabetic: Yes     Tobacco smoker: No     Systolic  Blood Pressure: 136 mmHg     Is BP treated: No     HDL Cholesterol: 48.2 mg/dL     Total Cholesterol: 178 mg/dL       Relevant Medications   Dulaglutide (TRULICITY) 9.64 RC/3.8FM SOPN   lisinopril (ZESTRIL) 5 MG tablet   metFORMIN (GLUCOPHAGE) 1000 MG tablet   ezetimibe (ZETIA) 10 MG tablet   Fatty liver    LFTs remain normal.       Low vitamin B12 level    Levels well repleted with daily b12.       Statin intolerance   Diabetic cataract, associated with type 2 diabetes mellitus (HCC)   Relevant Medications   Dulaglutide (TRULICITY) 4.03 FV/4.3KG SOPN   lisinopril (ZESTRIL) 5 MG tablet   metFORMIN (GLUCOPHAGE) 1000 MG tablet   Other Visit Diagnoses     Need for influenza vaccination       Relevant Orders   Flu Vaccine QUAD 61mo+IM (Fluarix, Fluzone & Alfiuria Quad PF) (Completed)        Meds ordered this encounter  Medications   Dulaglutide (TRULICITY) 6.77 CH/4.0BT SOPN    Sig: Inject 1.5 mg into the skin once a week.    Dispense:  6 mL    Refill:  3   gabapentin (NEURONTIN) 300 MG capsule    Sig: Take 1-2 capsules (300-600 mg total) by mouth at bedtime.    Dispense:  135 capsule    Refill:  3   lisinopril (ZESTRIL) 5 MG tablet    Sig: Take 1 tablet (5 mg total) by mouth daily.    Dispense:  90 tablet    Refill:  3   metFORMIN (GLUCOPHAGE) 1000 MG tablet    Sig: Take 1 tablet (1,000 mg total) by mouth 2 (two) times daily with a meal.    Dispense:  180 tablet    Refill:  3   ezetimibe (ZETIA) 10 MG tablet    Sig: Take 1 tablet (10 mg total) by mouth daily.    Dispense:  30 tablet    Refill:  11   Orders Placed This Encounter  Procedures   Flu Vaccine QUAD 86mo+IM (Fluarix, Fluzone & Alfiuria Quad PF)   POCT glycosylated hemoglobin (Hb A1C)    Standing Status:   Future    Standing Expiration Date:   10/31/2022    Patient instructions: Flu shot today. Tdap today.  Work on advanced directive, bring [place] a copy.  Try zetia for cholesterol Increase  trulicity to 1.$RemoveBefore'5mg'VgJTOEmFmpFBt$  weekly - new dose sent to pharmacy.  Return in 3 months for lab visit only (A1c) Return in 6 months for diabetes follow up visit.   Follow up plan: Return in about 6 months (around  10/31/2022) for follow up visit.  Ria Bush, MD

## 2022-05-01 NOTE — Addendum Note (Signed)
Addended by: Brenton Grills on: 21/94/7125 10:46 AM   Modules accepted: Orders

## 2022-05-01 NOTE — Assessment & Plan Note (Signed)
Preventative protocols reviewed and updated unless pt declined. Discussed healthy diet and lifestyle.  

## 2022-05-01 NOTE — Assessment & Plan Note (Signed)
Statin intolerance. Try zetia '10mg'$  daily.  The 10-year ASCVD risk score (Arnett DK, et al., 2019) is: 22.3%   Values used to calculate the score:     Age: 66 years     Sex: Male     Is Non-Hispanic African American: No     Diabetic: Yes     Tobacco smoker: No     Systolic Blood Pressure: 909 mmHg     Is BP treated: No     HDL Cholesterol: 48.2 mg/dL     Total Cholesterol: 178 mg/dL

## 2022-07-04 ENCOUNTER — Encounter: Payer: Self-pay | Admitting: Family Medicine

## 2022-07-04 NOTE — Telephone Encounter (Signed)
Lvm asking pt to call back. Pt will need OV to be evaluated.   I will also send message via MyChart.

## 2022-07-28 ENCOUNTER — Encounter: Payer: Self-pay | Admitting: Family Medicine

## 2022-07-29 ENCOUNTER — Other Ambulatory Visit: Payer: Self-pay | Admitting: Family Medicine

## 2022-07-29 ENCOUNTER — Other Ambulatory Visit (HOSPITAL_COMMUNITY): Payer: Self-pay

## 2022-08-01 ENCOUNTER — Other Ambulatory Visit: Payer: 59

## 2022-08-28 ENCOUNTER — Encounter: Payer: Self-pay | Admitting: Family Medicine

## 2022-08-28 ENCOUNTER — Other Ambulatory Visit: Payer: Self-pay | Admitting: Family Medicine

## 2022-08-28 NOTE — Telephone Encounter (Signed)
Too soon. Rx sent on 05/01/22, #135/3 with new sig to [facility].  Denied request.

## 2022-08-28 NOTE — Telephone Encounter (Signed)
Pt should have refills available. Rx was sent 05/01/22, #135/3 to [facility].   Will notify pt via MyChart to contact pharmacy and actually speak with someone.

## 2022-09-30 ENCOUNTER — Encounter: Payer: Self-pay | Admitting: Family Medicine

## 2022-10-02 ENCOUNTER — Other Ambulatory Visit: Payer: Self-pay

## 2022-10-02 DIAGNOSIS — E1169 Type 2 diabetes mellitus with other specified complication: Secondary | ICD-10-CM

## 2022-10-02 NOTE — Telephone Encounter (Signed)
Trulicity [id number] mg Last rx: 05/01/22, #6 mL Last OV:  05/01/22, CPE Next OV:  none

## 2022-10-03 ENCOUNTER — Other Ambulatory Visit: Payer: Self-pay | Admitting: Family Medicine

## 2022-10-03 MED ORDER — OZEMPIC (0.25 OR 0.5 MG/DOSE) 2 MG/1.5ML ~~LOC~~ SOPN: 0.5000 mg | PEN_INJECTOR | SUBCUTANEOUS | 3 refills | Status: DC

## 2022-10-03 NOTE — Telephone Encounter (Signed)
Sent Ozempic PA request to Rx PA Team to submit. (See 10/02/22 refill note.)

## 2022-10-03 NOTE — Telephone Encounter (Signed)
Trulicity allergy see below - please submit PA for Ozempic which I've sent to pharmacy

## 2022-10-03 NOTE — Telephone Encounter (Signed)
See mychart message Trulicity allergy - itchy welps to injection site.  Please submit PA for Ozempic which I've sent to pharmacy

## 2022-10-08 ENCOUNTER — Other Ambulatory Visit (HOSPITAL_COMMUNITY): Payer: Self-pay

## 2022-10-08 NOTE — Telephone Encounter (Signed)
Patient Advocate Encounter   Received notification from [place] that prior authorization for Ozempic is required.   PA submitted on 10/08/2022 Key [id number] Status is pending

## 2022-10-17 ENCOUNTER — Telehealth: Payer: Self-pay

## 2022-10-17 ENCOUNTER — Other Ambulatory Visit (HOSPITAL_COMMUNITY): Payer: Self-pay

## 2022-10-17 NOTE — Telephone Encounter (Signed)
Pharmacy Patient Advocate Encounter  Received notification from [employer] that the request for prior authorization for Ozempic has been denied due to .    Please be advised we currently do not have a Pharmacist to review denials, therefore you will need to process appeals accordingly as needed. Thanks for your support at this time.   You may call [phone]  to appeal.

## 2022-10-17 NOTE — Telephone Encounter (Signed)
PA denied, documented in separate encounter.

## 2022-10-18 NOTE — Telephone Encounter (Addendum)
Was initial PA submitted with all necessary documentation? Please resubmit with this information:  Pt already on metformin full dose. He has allergic reaction to preferred formulary med Trulicity. Do they still deny Ozempic despite all this?    Sending to my pool and back to pharmacy team.  Letter written and in Lisa's box in case again denied.

## 2022-10-18 NOTE — Telephone Encounter (Signed)
Called insurance company to start the appeal. Was on the phone for over 20 minutes and transferred several times I was never able to speak to anyone.

## 2022-10-18 NOTE — Telephone Encounter (Signed)
I see the appeal has been submitted, upon further review, PA was submitted with chart notes from when Trulicity was prescribed to show that pt has trial/failure of medication, chart notes do include diagnosis, Hgb A1c, etc. The only thing I can see that wasn't submitted was something stating that the patient has an allergic reaction to Trulicity, however, that is noted multiple times in [facility]. Unfortunately, sometimes we do see diabetics denied for not being "diabetic" despite chart notes explicitly showing diagnosis. I'd be happy to resubmit but I believe we would see a quicker turn around time on the expedited appeal that has already been submitted. I will keep an eye out in Onbase for a response and update as soon as it is received. Please let me know if I can be of any further help. Apologies for the previous delay.

## 2022-10-18 NOTE — Telephone Encounter (Signed)
Please do appeal:  Pt has tried and not tolerated formulary alternative Trulicity due to itchy welps at injection site. He has tolerated Ozempic well in the past that's why I requested insurance coverage for Ozempic.

## 2022-10-18 NOTE — Telephone Encounter (Addendum)
Received fax from [facility] showing to call [phone] to expedite the appeal..  Called telephone number and left a detailed message. Unable to speak to anyone and the only option was to leave a message.  Sending a letter may be quicker.

## 2022-10-18 NOTE — Telephone Encounter (Signed)
PA denied, appeal is needed. We are in process of appeal: Pt has tried and not tolerated formulary alternative Trulicity due to itchy welps at injection site. He has tolerated Ozempic well in the past that's why I requested insurance coverage for Ozempic.

## 2022-10-18 NOTE — Telephone Encounter (Signed)
Faxed expedited appeal to [employer] at [phone]. Also, mailed expedited appeal for external review to: [place] [employer], [place] [address] [place], [place] [address]  Decision pending.

## 2022-10-22 NOTE — Telephone Encounter (Signed)
No worries and thanks for everything you've done. We'll see what the appeal results are.

## 2022-10-30 ENCOUNTER — Encounter: Payer: Self-pay | Admitting: Family Medicine

## 2022-10-30 NOTE — Telephone Encounter (Signed)
Can we check on status of appeal?

## 2022-10-30 NOTE — Telephone Encounter (Signed)
Still waiting for their decision.

## 2022-11-01 ENCOUNTER — Ambulatory Visit: Payer: 59 | Admitting: Family Medicine

## 2022-11-01 NOTE — Telephone Encounter (Addendum)
Received letter from [place] acknowledging they received the appeal request on 10/18/22. States they will send a written response within 30 calendar days from the date they received the request. Fyii to Dr. Reece Agar.  Notified pt via MyChart. (See 10/30/22 pt msg.)

## 2022-11-22 NOTE — Telephone Encounter (Signed)
Can we check on decision? It's been >30 days.

## 2022-11-25 ENCOUNTER — Other Ambulatory Visit: Payer: Self-pay | Admitting: Family Medicine

## 2022-11-25 NOTE — Telephone Encounter (Signed)
Refill request Ozempic Last refill 10/03/22  4.5 ml/3 refills Last office visit 05/01/22

## 2022-11-26 NOTE — Telephone Encounter (Signed)
Called [employer] at [phone] and left a voicemail to call the office back. Need the status of the appeal for the patient. The appeal ws submitted over 30 days ago. Patient ID # [id number] Case # [id number]

## 2022-11-27 MED ORDER — LIRAGLUTIDE 18 MG/3ML ~~LOC~~ SOPN: PEN_INJECTOR | SUBCUTANEOUS | 0 refills | Status: DC

## 2022-11-27 NOTE — Telephone Encounter (Signed)
Rosey Bath from [place] called back stating that the appeal was denied and a letter was sent out 11/20/22.  Representative stated that there was not any documentation that patient tried Victoza and failed. The letter is being re-faxed to our office today.

## 2022-11-27 NOTE — Telephone Encounter (Signed)
Plz notify pt: We finally received notice from insurance that they again have denied the Ozempic weekly shots. His insurance is requiring that you first try Victoza which is a daily shot in the same family as ozempic. I have sent victoza for you to try. Start 0.6mg  weekly for 1 week then increase to 1.2mg  daily. Keep [place] updated with how you do on this medicine.  Schedule a diabetes follow up over the next 1-2 months.

## 2022-11-27 NOTE — Telephone Encounter (Signed)
Left another message on voicemail for a representative to call the office back ASAP.

## 2022-11-27 NOTE — Addendum Note (Signed)
Addended by: Eustaquio Boyden on: 11/27/2022 05:19 PM   Modules accepted: Orders

## 2022-11-28 ENCOUNTER — Other Ambulatory Visit (HOSPITAL_COMMUNITY): Payer: Self-pay

## 2022-11-28 ENCOUNTER — Telehealth: Payer: Self-pay

## 2022-11-28 NOTE — Telephone Encounter (Signed)
I've sent Rx for Victoza to try per insurance requirement.

## 2022-11-28 NOTE — Telephone Encounter (Signed)
Patient Advocate Encounter  Prior Authorization for Victoza 18MG /3ML pen-injectors has been approved with [employer].    PA# [id number] Key: [id number]  Effective dates: 11/28/22 through 11/28/23

## 2022-11-28 NOTE — Telephone Encounter (Signed)
Spoke with pt relaying Dr. Timoteo Expose message and notified him the message was sent to his MyChart. Pt verbalizes understanding and will call back to schedule OV.

## 2022-11-28 NOTE — Telephone Encounter (Signed)
Can we request STAT PA to be done for Victoza?  Please send request with below information:  We have been dealing with insurance for months to get injectable medication approved.  He is already on full dose metformin without glycemic control achieved. He cannot tolerate sulfonylurea ie glipizide.  He has allergy to Trulicity - welps at injection site.  He has tolerated Ozempic very well but insurance has denied repeatedly.  Latest they required him to try Victoza first - please fill out expedited PA for Victoza.

## 2022-11-29 NOTE — Telephone Encounter (Signed)
Spoke with [facility] making them aware of PA. Says it's in process to be filled.

## 2023-01-06 DIAGNOSIS — L97512 Non-pressure chronic ulcer of other part of right foot with fat layer exposed: Secondary | ICD-10-CM | POA: Diagnosis not present

## 2023-01-06 DIAGNOSIS — E1142 Type 2 diabetes mellitus with diabetic polyneuropathy: Secondary | ICD-10-CM | POA: Diagnosis not present

## 2023-01-07 ENCOUNTER — Encounter: Payer: Self-pay | Admitting: Family Medicine

## 2023-01-07 DIAGNOSIS — E1169 Type 2 diabetes mellitus with other specified complication: Secondary | ICD-10-CM

## 2023-01-07 DIAGNOSIS — E1136 Type 2 diabetes mellitus with diabetic cataract: Secondary | ICD-10-CM

## 2023-01-13 ENCOUNTER — Encounter: Payer: Self-pay | Admitting: Family Medicine

## 2023-01-13 DIAGNOSIS — E1142 Type 2 diabetes mellitus with diabetic polyneuropathy: Secondary | ICD-10-CM | POA: Diagnosis not present

## 2023-01-13 DIAGNOSIS — L97512 Non-pressure chronic ulcer of other part of right foot with fat layer exposed: Secondary | ICD-10-CM | POA: Diagnosis not present

## 2023-01-14 MED ORDER — ACCU-CHEK GUIDE VI STRP: ORAL_STRIP | 3 refills | Status: AC

## 2023-01-14 MED ORDER — ACCU-CHEK FASTCLIX LANCETS MISC: 1.0000 | 3 refills | Status: AC

## 2023-01-28 ENCOUNTER — Telehealth: Payer: Self-pay

## 2023-02-03 DIAGNOSIS — E1142 Type 2 diabetes mellitus with diabetic polyneuropathy: Secondary | ICD-10-CM | POA: Diagnosis not present

## 2023-02-03 DIAGNOSIS — L97512 Non-pressure chronic ulcer of other part of right foot with fat layer exposed: Secondary | ICD-10-CM | POA: Diagnosis not present

## 2023-02-24 DIAGNOSIS — E1142 Type 2 diabetes mellitus with diabetic polyneuropathy: Secondary | ICD-10-CM | POA: Diagnosis not present

## 2023-02-24 DIAGNOSIS — L97512 Non-pressure chronic ulcer of other part of right foot with fat layer exposed: Secondary | ICD-10-CM | POA: Diagnosis not present

## 2023-03-24 DIAGNOSIS — L97512 Non-pressure chronic ulcer of other part of right foot with fat layer exposed: Secondary | ICD-10-CM | POA: Diagnosis not present

## 2023-03-24 DIAGNOSIS — E1142 Type 2 diabetes mellitus with diabetic polyneuropathy: Secondary | ICD-10-CM | POA: Diagnosis not present

## 2023-04-10 ENCOUNTER — Telehealth: Payer: Self-pay

## 2023-04-10 NOTE — Telephone Encounter (Signed)
Copied from CRM [id number]. Topic: Appointment Scheduling - Scheduling Inquiry for Clinic >> Apr 10, 2023 12:00 PM Lennox Pippins wrote: Patient's wife Bosie Clos has called to reschedule patients new patient appt on 04/23/2023. Nicki Reaper has scheduled this to work patient in for this appointment and next new patient is not until April of next year. Please advise if appt needs to be worked in sooner and contact back @ # [phone] (patients wife Bosie Clos)

## 2023-04-10 NOTE — Telephone Encounter (Signed)
Is this okay to reschedule to a sooner day?   Thanks,   -Vernona Rieger

## 2023-04-11 ENCOUNTER — Encounter: Payer: Self-pay | Admitting: Internal Medicine

## 2023-04-11 NOTE — Telephone Encounter (Signed)
Sent patient message via MyChart

## 2023-04-23 ENCOUNTER — Ambulatory Visit: Payer: Self-pay | Admitting: Internal Medicine

## 2023-04-28 DIAGNOSIS — E1169 Type 2 diabetes mellitus with other specified complication: Secondary | ICD-10-CM | POA: Diagnosis not present

## 2023-04-28 DIAGNOSIS — E785 Hyperlipidemia, unspecified: Secondary | ICD-10-CM | POA: Diagnosis not present

## 2023-04-28 DIAGNOSIS — E1142 Type 2 diabetes mellitus with diabetic polyneuropathy: Secondary | ICD-10-CM | POA: Diagnosis not present

## 2023-05-26 ENCOUNTER — Other Ambulatory Visit (INDEPENDENT_AMBULATORY_CARE_PROVIDER_SITE_OTHER): Payer: Self-pay

## 2023-05-26 ENCOUNTER — Other Ambulatory Visit: Payer: Self-pay | Admitting: Family Medicine

## 2023-05-26 DIAGNOSIS — Z125 Encounter for screening for malignant neoplasm of prostate: Secondary | ICD-10-CM

## 2023-05-26 DIAGNOSIS — E1169 Type 2 diabetes mellitus with other specified complication: Secondary | ICD-10-CM

## 2023-05-26 DIAGNOSIS — Z85528 Personal history of other malignant neoplasm of kidney: Secondary | ICD-10-CM | POA: Diagnosis not present

## 2023-05-26 DIAGNOSIS — E785 Hyperlipidemia, unspecified: Secondary | ICD-10-CM | POA: Diagnosis not present

## 2023-05-26 DIAGNOSIS — R7989 Other specified abnormal findings of blood chemistry: Secondary | ICD-10-CM

## 2023-05-26 LAB — URINALYSIS, ROUTINE W REFLEX MICROSCOPIC
Bilirubin Urine: NEGATIVE
Hgb urine dipstick: NEGATIVE
Ketones, ur: NEGATIVE
Leukocytes,Ua: NEGATIVE
Nitrite: NEGATIVE
RBC / HPF: NONE SEEN (ref 0–?)
Specific Gravity, Urine: >=1.03 — AB (ref 1.000–1.030)
Total Protein, Urine: NEGATIVE
Urine Glucose: 250 — AB
Urobilinogen, UA: 0.2 (ref 0.0–1.0)
pH: 5.5 (ref 5.0–8.0)

## 2023-05-26 LAB — CBC WITH DIFFERENTIAL/PLATELET
Basophils Absolute: 0 10*3/uL (ref 0.0–0.1)
Basophils Relative: 0.7 % (ref 0.0–3.0)
Eosinophils Absolute: 0.1 10*3/uL (ref 0.0–0.7)
Eosinophils Relative: 2 % (ref 0.0–5.0)
HCT: 41.9 % (ref 39.0–52.0)
Hemoglobin: 13.1 g/dL (ref 13.0–17.0)
Lymphocytes Relative: 34.2 % (ref 12.0–46.0)
Lymphs Abs: 2.3 10*3/uL (ref 0.7–4.0)
MCHC: 31.3 g/dL (ref 30.0–36.0)
MCV: 70.6 fL — ABNORMAL LOW (ref 78.0–100.0)
Monocytes Absolute: 0.5 10*3/uL (ref 0.1–1.0)
Monocytes Relative: 7.2 % (ref 3.0–12.0)
Neutro Abs: 3.7 10*3/uL (ref 1.4–7.7)
Neutrophils Relative %: 55.9 % (ref 43.0–77.0)
Platelets: 292 10*3/uL (ref 150.0–400.0)
RBC: 5.93 Mil/uL — ABNORMAL HIGH (ref 4.22–5.81)
RDW: 15 % (ref 11.5–15.5)
WBC: 6.7 10*3/uL (ref 4.0–10.5)

## 2023-05-26 LAB — COMPREHENSIVE METABOLIC PANEL
ALT: 22 U/L (ref 0–53)
AST: 17 U/L (ref 0–37)
Albumin: 4.9 g/dL (ref 3.5–5.2)
Alkaline Phosphatase: 66 U/L (ref 39–117)
BUN: 14 mg/dL (ref 6–23)
CO2: 25 meq/L (ref 19–32)
Calcium: 10.2 mg/dL (ref 8.4–10.5)
Chloride: 101 meq/L (ref 96–112)
Creatinine, Ser: 0.89 mg/dL (ref 0.40–1.50)
GFR: 90.17 mL/min (ref >60.00)
Glucose, Bld: 173 mg/dL — ABNORMAL HIGH (ref 70–99)
Potassium: 4.6 meq/L (ref 3.5–5.1)
Sodium: 139 meq/L (ref 135–145)
Total Bilirubin: 0.6 mg/dL (ref 0.2–1.2)
Total Protein: 7.6 g/dL (ref 6.0–8.3)

## 2023-05-26 LAB — LIPID PANEL
Cholesterol: 178 mg/dL (ref 0–200)
HDL: 50.8 mg/dL (ref >39.00)
LDL Cholesterol: 103 mg/dL — ABNORMAL HIGH (ref 0–99)
NonHDL: 127.57
Total CHOL/HDL Ratio: 4
Triglycerides: 122 mg/dL (ref 0.0–149.0)
VLDL: 24.4 mg/dL (ref 0.0–40.0)

## 2023-05-26 LAB — HEMOGLOBIN A1C: Hgb A1c MFr Bld: 9.7 % — ABNORMAL HIGH (ref 4.6–6.5)

## 2023-05-26 LAB — PSA: PSA: 0.25 ng/mL (ref 0.10–4.00)

## 2023-05-26 LAB — VITAMIN B12: Vitamin B-12: 704 pg/mL (ref 211–911)

## 2023-05-26 LAB — MICROALBUMIN / CREATININE URINE RATIO
Creatinine,U: 136.1 mg/dL
Microalb Creat Ratio: 2.8 mg/g (ref 0.0–30.0)
Microalb, Ur: 3.8 mg/dL — ABNORMAL HIGH (ref 0.0–1.9)

## 2023-05-27 ENCOUNTER — Encounter: Payer: Self-pay | Admitting: Family Medicine

## 2023-05-27 NOTE — Telephone Encounter (Signed)
last appointment 05/01/22 next appointment 05/30/23 last fill 05/01/22 #135 3 rf

## 2023-05-29 MED ORDER — GABAPENTIN 300 MG PO CAPS: 300.0000 mg | ORAL_CAPSULE | Freq: Every day | ORAL | 0 refills | Status: DC

## 2023-05-30 ENCOUNTER — Ambulatory Visit: Payer: Medicare Other | Admitting: Family Medicine

## 2023-05-30 VITALS — BP 130/74 | HR 60 | Temp 97.7°F | Ht 65.25 in | Wt 171.0 lb

## 2023-05-30 DIAGNOSIS — K760 Fatty (change of) liver, not elsewhere classified: Secondary | ICD-10-CM

## 2023-05-30 DIAGNOSIS — Z7189 Other specified counseling: Secondary | ICD-10-CM

## 2023-05-30 DIAGNOSIS — E1169 Type 2 diabetes mellitus with other specified complication: Secondary | ICD-10-CM

## 2023-05-30 DIAGNOSIS — Z23 Encounter for immunization: Secondary | ICD-10-CM | POA: Diagnosis not present

## 2023-05-30 DIAGNOSIS — E663 Overweight: Secondary | ICD-10-CM

## 2023-05-30 DIAGNOSIS — Z0000 Encounter for general adult medical examination without abnormal findings: Secondary | ICD-10-CM | POA: Insufficient documentation

## 2023-05-30 DIAGNOSIS — Z85528 Personal history of other malignant neoplasm of kidney: Secondary | ICD-10-CM

## 2023-05-30 DIAGNOSIS — Z1211 Encounter for screening for malignant neoplasm of colon: Secondary | ICD-10-CM | POA: Insufficient documentation

## 2023-05-30 DIAGNOSIS — Z789 Other specified health status: Secondary | ICD-10-CM

## 2023-05-30 DIAGNOSIS — D563 Thalassemia minor: Secondary | ICD-10-CM

## 2023-05-30 DIAGNOSIS — Z8631 Personal history of diabetic foot ulcer: Secondary | ICD-10-CM

## 2023-05-30 DIAGNOSIS — R7989 Other specified abnormal findings of blood chemistry: Secondary | ICD-10-CM

## 2023-05-30 DIAGNOSIS — Z7985 Long-term (current) use of injectable non-insulin antidiabetic drugs: Secondary | ICD-10-CM

## 2023-05-30 DIAGNOSIS — E785 Hyperlipidemia, unspecified: Secondary | ICD-10-CM

## 2023-05-30 HISTORY — DX: Personal history of diabetic foot ulcer: Z86.31

## 2023-05-30 MED ORDER — B-12 1000 MCG SL SUBL: 1.0000 | SUBLINGUAL_TABLET | SUBLINGUAL | Status: AC

## 2023-05-30 MED ORDER — METFORMIN HCL 1000 MG PO TABS: 1000.0000 mg | ORAL_TABLET | Freq: Two times a day (BID) | ORAL | 4 refills | Status: DC

## 2023-05-30 MED ORDER — EZETIMIBE 10 MG PO TABS: 10.0000 mg | ORAL_TABLET | Freq: Every day | ORAL | 4 refills | Status: DC

## 2023-05-30 MED ORDER — GABAPENTIN 300 MG PO CAPS: 300.0000 mg | ORAL_CAPSULE | Freq: Three times a day (TID) | ORAL | 4 refills | Status: DC

## 2023-05-30 MED ORDER — LISINOPRIL 5 MG PO TABS: 5.0000 mg | ORAL_TABLET | Freq: Every day | ORAL | 4 refills | Status: DC

## 2023-05-30 NOTE — Assessment & Plan Note (Addendum)
Continue every other day b12.

## 2023-05-30 NOTE — Assessment & Plan Note (Signed)

## 2023-05-30 NOTE — Assessment & Plan Note (Addendum)
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

## 2023-05-30 NOTE — Assessment & Plan Note (Signed)
By prior imaging. LFTs remain normal.

## 2023-05-30 NOTE — Patient Instructions (Addendum)
We will refer you for colonoscopy in [place] Flu shot today  Prevnar-20 shot today  Advanced directive packet provided today.  Good to see you today.  Return in 6 months for follow up visit.

## 2023-05-30 NOTE — Assessment & Plan Note (Signed)
Recent UA normal ?

## 2023-05-30 NOTE — Assessment & Plan Note (Addendum)
Advanced directive - has not completed. Would want wife [name] to be HCPOA. Will work on this, bring [place] copy when completed. Packet provided today.

## 2023-05-30 NOTE — Assessment & Plan Note (Signed)
This has healed.

## 2023-05-30 NOTE — Assessment & Plan Note (Signed)
Chronic, has established with endocrinology now back on Ozempic. Appreciate their care.

## 2023-05-30 NOTE — Assessment & Plan Note (Addendum)
Chronic, mild in diabetic. Statin intolerance. Reviewed options - will retry zetia.  The 10-year ASCVD risk score (Arnett DK, et al., 2019) is: 21.8%   Values used to calculate the score:     Age: 67 years     Sex: Male     Is Non-Hispanic African American: No     Diabetic: Yes     Tobacco smoker: No     Systolic Blood Pressure: 130 mmHg     Is BP treated: No     HDL Cholesterol: 50.8 mg/dL     Total Cholesterol: 178 mg/dL

## 2023-05-30 NOTE — Progress Notes (Signed)
Ph: [phone] Fax: [phone]   Patient ID: Blake Manning, male    DOB: 1956/01/20, 67 y.o.   MRN: [id number]  This visit was conducted in person.  BP 130/74   Pulse 60   Temp 97.7 F (36.5 C) (Oral)   Ht 5' 5.25" (1.657 m)   Wt 171 lb (77.6 kg)   SpO2 98%   BMI 28.24 kg/m    CC: welcome to medicare visit Subjective:   HPI: Clynton Shanholtz is a 67 y.o. male presenting on 05/30/2023 for Welcome to Medicare Exam   Last seen for CPE 04/2022.   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 Off potassium citrate for kidney stone history. No recent kidney stones   T2DM - established with [facility] endo Dr Gershon Crane. Restarted on ozempic, 0.25mg  weekly for the first month then planned 0.5mg  weekly. H/o diabetic R foot ulcer 01/2023 followed by podiatry, fully healed on last check 03/2023. Pending eye exam. Continues gabapentin 300mg  1-2 at night. Previously trouble getting GLP1RA approved by prior insurance - lots of back and forth.   HLD - statin intolerance even lovastatin (moody), zetia intolerance (loopy).   Preventative: Colonoscopy 2012 @ [place] told all normal - unable to receive records despite multiple attempts.  iFOB normal 12/2017, 06/2020, 04/2022. Requests referral for rpt colonoscopy.  Prostate cancer screening - previously saw urologist Sheppard Penton) h/o mild enlargement. Nocturia x0-1.  Lung cancer screening - not eligible  Flu shot - today COVID vaccine J&J 11/2019. Pfizer booster 07/2020.  Tetanus shot - 2011, Tdap 04/2022.  Pneumovax - 2015. prevnar 20 today  shingrix - 06/2020, 10/2020.  Advanced directive - has not completed. Would want wife [name] to be HCPOA. Will work on this, bring [place] copy when completed. Packet provided today.  Seat belt use discussed  Sunscreen use discussed, no changing moles on skin.  Non smoker  Alcohol - none  Dentist q6 mo Eye exam yearly - upcoming appt Monday Bowel - no constipation   Bladder - no incontinence    Lives with wife, 1 dog   Grown children   Occupation: Retail banker  Edu: HS Activity: exercises at farm. Playing pickle ball and tennis. 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  Diet: good water and flavored water, fruits/vegetables daily      Relevant past medical, surgical, family and social history reviewed and updated as indicated. Interim medical history since our last visit reviewed. Allergies and medications reviewed and updated. Outpatient Medications Prior to Visit  Medication Sig Dispense Refill   Accu-Chek FastClix Lancets MISC 1 each by Does not apply route as directed. Use as directed to check blood sugar 102 each 3   Blood Glucose Monitoring Suppl (ACCU-CHEK NANO SMARTVIEW) w/Device KIT 1 each by Does not apply route as directed. Use as directed to check blood sugar 1 kit 0   glucose blood (ACCU-CHEK GUIDE) test strip Check sugar once daily. DX E11.40 100 strip 3   Cyanocobalamin (B-12) 1000 MCG SUBL Place 1 tablet under the tongue daily.     ezetimibe (ZETIA) 10 MG tablet Take 1 tablet (10 mg total) by mouth daily. 30 tablet 11   gabapentin (NEURONTIN) 300 MG capsule Take 1-2 capsules (300-600 mg total) by mouth at bedtime. 135 capsule 0   lisinopril (ZESTRIL) 5 MG tablet Take 1 tablet (5 mg total) by mouth daily. 90 tablet 3   metFORMIN (GLUCOPHAGE) 1000 MG tablet Take 1 tablet (1,000 mg total) by mouth 2 (two)  times daily with a meal. 180 tablet 3   aspirin 81 MG EC tablet Take 1 tablet (81 mg total) by mouth daily. Swallow whole.     liraglutide (VICTOZA) 18 MG/3ML SOPN Inject 0.6 mg into the skin daily for 7 days, THEN 1.2 mg daily. 15 mL 0   No facility-administered medications prior to visit.     Per HPI unless specifically indicated in ROS section below Review of Systems  Objective:  BP 130/74   Pulse 60   Temp 97.7 F (36.5 C) (Oral)   Ht 5' 5.25" (1.657 m)   Wt 171 lb (77.6 kg)   SpO2 98%   BMI 28.24 kg/m   Wt Readings from  Last 3 Encounters:  05/30/23 171 lb (77.6 kg)  05/01/22 175 lb 8 oz (79.6 kg)  07/17/21 179 lb (81.2 kg)      Physical Exam Vitals and nursing note reviewed.  Constitutional:      General: He is not in acute distress.    Appearance: Normal appearance. He is well-developed. He is not ill-appearing.  HENT:     Head: Normocephalic and atraumatic.     Right Ear: Hearing, tympanic membrane, ear canal and external ear normal.     Left Ear: Hearing, tympanic membrane, ear canal and external ear normal.     Mouth/Throat:     Mouth: Mucous membranes are moist.     Pharynx: Oropharynx is clear. No oropharyngeal exudate or posterior oropharyngeal erythema.  Eyes:     General: No scleral icterus.    Extraocular Movements: Extraocular movements intact.     Conjunctiva/sclera: Conjunctivae normal.     Pupils: Pupils are equal, round, and reactive to light.  Neck:     Thyroid: No thyroid mass or thyromegaly.     Vascular: No carotid bruit.  Cardiovascular:     Rate and Rhythm: Normal rate and regular rhythm.     Pulses: Normal pulses.          Radial pulses are 2+ on the right side and 2+ on the left side.     Heart sounds: Normal heart sounds. No murmur heard. Pulmonary:     Effort: Pulmonary effort is normal. No respiratory distress.     Breath sounds: Normal breath sounds. No wheezing, rhonchi or rales.  Abdominal:     General: Bowel sounds are normal. There is no distension.     Palpations: Abdomen is soft. There is no mass.     Tenderness: There is no abdominal tenderness. There is no guarding or rebound.     Hernia: No hernia is present.  Musculoskeletal:        General: Normal range of motion.     Cervical back: Normal range of motion and neck supple.     Right lower leg: No edema.     Left lower leg: No edema.  Lymphadenopathy:     Cervical: No cervical adenopathy.  Skin:    General: Skin is warm and dry.     Findings: No rash.  Neurological:     General: No focal deficit  present.     Mental Status: He is alert and oriented to person, place, and time.     Comments:  Recall 3/3 Calculation DLROW 5/5  Psychiatric:        Mood and Affect: Mood normal.        Behavior: Behavior normal.        Thought Content: Thought content normal.  Judgment: Judgment normal.       Results for orders placed or performed in visit on 05/26/23  Urinalysis, Routine w reflex microscopic  Result Value Ref Range   Color, Urine YELLOW Yellow;Lt. Yellow;Straw;Dark Yellow;Amber;Green;Red;Brown   APPearance CLEAR Clear;Turbid;Slightly Cloudy;Cloudy   Specific Gravity, Urine >=1.030 (A) 1.000 - 1.030   pH 5.5 5.0 - 8.0   Total Protein, Urine NEGATIVE Negative   Urine Glucose 250 (A) Negative   Ketones, ur NEGATIVE Negative   Bilirubin Urine NEGATIVE Negative   Hgb urine dipstick NEGATIVE Negative   Urobilinogen, UA 0.2 0.0 - 1.0   Leukocytes,Ua NEGATIVE Negative   Nitrite NEGATIVE Negative   WBC, UA 0-2/hpf 0-2/hpf   RBC / HPF none seen 0-2/hpf  Vitamin B12  Result Value Ref Range   Vitamin B-12 704 211 - 911 pg/mL  CBC with Differential/Platelet  Result Value Ref Range   WBC 6.7 4.0 - 10.5 K/uL   RBC 5.93 (H) 4.22 - 5.81 Mil/uL   Hemoglobin 13.1 13.0 - 17.0 g/dL   HCT 16.1 09.6 - 04.5 %   MCV 70.6 (L) 78.0 - 100.0 fl   MCHC 31.3 30.0 - 36.0 g/dL   RDW 40.9 81.1 - 91.4 %   Platelets 292.0 150.0 - 400.0 K/uL   Neutrophils Relative % 55.9 43.0 - 77.0 %   Lymphocytes Relative 34.2 12.0 - 46.0 %   Monocytes Relative 7.2 3.0 - 12.0 %   Eosinophils Relative 2.0 0.0 - 5.0 %   Basophils Relative 0.7 0.0 - 3.0 %   Neutro Abs 3.7 1.4 - 7.7 K/uL   Lymphs Abs 2.3 0.7 - 4.0 K/uL   Monocytes Absolute 0.5 0.1 - 1.0 K/uL   Eosinophils Absolute 0.1 0.0 - 0.7 K/uL   Basophils Absolute 0.0 0.0 - 0.1 K/uL  PSA  Result Value Ref Range   PSA 0.25 0.10 - 4.00 ng/mL  Comprehensive metabolic panel  Result Value Ref Range   Sodium 139 135 - 145 mEq/L   Potassium 4.6 3.5 - 5.1  mEq/L   Chloride 101 96 - 112 mEq/L   CO2 25 19 - 32 mEq/L   Glucose, Bld 173 (H) 70 - 99 mg/dL   BUN 14 6 - 23 mg/dL   Creatinine, Ser 7.82 0.40 - 1.50 mg/dL   Total Bilirubin 0.6 0.2 - 1.2 mg/dL   Alkaline Phosphatase 66 39 - 117 U/L   AST 17 0 - 37 U/L   ALT 22 0 - 53 U/L   Total Protein 7.6 6.0 - 8.3 g/dL   Albumin 4.9 3.5 - 5.2 g/dL   GFR 95.62 >13.08 mL/min   Calcium 10.2 8.4 - 10.5 mg/dL  Lipid panel  Result Value Ref Range   Cholesterol 178 0 - 200 mg/dL   Triglycerides 657.8 0.0 - 149.0 mg/dL   HDL 46.96 >29.52 mg/dL   VLDL 84.1 0.0 - 32.4 mg/dL   LDL Cholesterol 401 (H) 0 - 99 mg/dL   Total CHOL/HDL Ratio 4    NonHDL 127.57   Microalbumin / creatinine urine ratio  Result Value Ref Range   Microalb, Ur 3.8 (H) 0.0 - 1.9 mg/dL   Creatinine,U 027.2 mg/dL   Microalb Creat Ratio 2.8 0.0 - 30.0 mg/g  Hemoglobin A1c  Result Value Ref Range   Hgb A1c MFr Bld 9.7 (H) 4.6 - 6.5 %   EKG - NSR rate 60, normal axis, intervals, no hypertrophy or acute ST/T changes   Assessment & Plan:   Problem List Items  Addressed This Visit     Advanced care planning/counseling discussion (Chronic)    Advanced directive - has not completed. Would want wife [name] to be HCPOA. Will work on this, bring [place] copy when completed. Packet provided today.       Welcome to Medicare preventive visit - Primary (Chronic)    I have personally reviewed the Medicare Annual Wellness questionnaire and have noted 1. The patient's medical and social history 2. Their use of alcohol, tobacco or illicit drugs 3. Their current medications and supplements 4. The patient's functional ability including ADL's, fall risks, home safety risks and hearing or visual impairment. Cognitive function has been assessed and addressed as indicated.  5. Diet and physical activity 6. Evidence for depression or mood disorders The patients weight, height, BMI have been recorded in the chart. I have made referrals, counseling and  provided education to the patient based on review of the above and I have provided the pt with a written personalized care plan for preventive services. Provider list updated.. See scanned questionairre as needed for further documentation. Reviewed preventative protocols and updated unless pt declined.       Relevant Orders   EKG 12-Lead (Completed)   Pneumococcal conjugate vaccine 20-valent (Prevnar 20) (Completed)   History of renal cell cancer    Recent UA normal       Type 2 diabetes mellitus with other specified complication (HCC)    Chronic, has established with endocrinology now back on Ozempic. Appreciate their care.       Relevant Medications   lisinopril (ZESTRIL) 5 MG tablet   metFORMIN (GLUCOPHAGE) 1000 MG tablet   Hyperlipidemia associated with type 2 diabetes mellitus (HCC)    Chronic, mild in diabetic. Statin intolerance. Reviewed options - will retry zetia.  The 10-year ASCVD risk score (Arnett DK, et al., 2019) is: 21.8%   Values used to calculate the score:     Age: 43 years     Sex: Male     Is Non-Hispanic African American: No     Diabetic: Yes     Tobacco smoker: No     Systolic Blood Pressure: 130 mmHg     Is BP treated: No     HDL Cholesterol: 50.8 mg/dL     Total Cholesterol: 178 mg/dL       Relevant Medications   lisinopril (ZESTRIL) 5 MG tablet   metFORMIN (GLUCOPHAGE) 1000 MG tablet   ezetimibe (ZETIA) 10 MG tablet   Fatty liver    By prior imaging. LFTs remain normal.       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       Relevant Medications   Cyanocobalamin (B-12) 1000 MCG SUBL   Low vitamin B12 level    Continue every other day b12.       Statin intolerance   History of diabetic ulcer of foot    This has healed.      Other Visit Diagnoses     Encounter for immunization       Relevant Orders   Flu Vaccine Trivalent High Dose  (Fluad) (Completed)   Special screening for malignant neoplasms, colon       Relevant Orders   Ambulatory referral to Gastroenterology        Meds ordered this encounter  Medications   gabapentin (NEURONTIN) 300 MG capsule    Sig: Take 1 capsule (300 mg total)  by mouth 3 (three) times daily.    Dispense:  270 capsule    Refill:  4   lisinopril (ZESTRIL) 5 MG tablet    Sig: Take 1 tablet (5 mg total) by mouth daily.    Dispense:  90 tablet    Refill:  4   metFORMIN (GLUCOPHAGE) 1000 MG tablet    Sig: Take 1 tablet (1,000 mg total) by mouth 2 (two) times daily with a meal.    Dispense:  180 tablet    Refill:  4   Cyanocobalamin (B-12) 1000 MCG SUBL    Sig: Place 1 tablet under the tongue every other day.   ezetimibe (ZETIA) 10 MG tablet    Sig: Take 1 tablet (10 mg total) by mouth daily.    Dispense:  90 tablet    Refill:  4    Orders Placed This Encounter  Procedures   Flu Vaccine Trivalent High Dose (Fluad)   Pneumococcal conjugate vaccine 20-valent (Prevnar 20)   Ambulatory referral to Gastroenterology    Referral Priority:   Routine    Referral Type:   Consultation    Referral Reason:   Specialty Services Required    Number of Visits Requested:   1   EKG 12-Lead    Patient Instructions  We will refer you for colonoscopy in [place] Flu shot today  Prevnar-20 shot today  Advanced directive packet provided today.  Good to see you today.  Return in 6 months for follow up visit.   Follow up plan: Return in about 6 months (around 11/27/2023) for follow up visit.  Eustaquio Boyden, MD

## 2023-06-02 DIAGNOSIS — H5212 Myopia, left eye: Secondary | ICD-10-CM | POA: Diagnosis not present

## 2023-06-02 DIAGNOSIS — H2513 Age-related nuclear cataract, bilateral: Secondary | ICD-10-CM | POA: Diagnosis not present

## 2023-06-02 DIAGNOSIS — E113291 Type 2 diabetes mellitus with mild nonproliferative diabetic retinopathy without macular edema, right eye: Secondary | ICD-10-CM | POA: Diagnosis not present

## 2023-06-02 DIAGNOSIS — H5201 Hypermetropia, right eye: Secondary | ICD-10-CM | POA: Diagnosis not present

## 2023-06-02 DIAGNOSIS — E113292 Type 2 diabetes mellitus with mild nonproliferative diabetic retinopathy without macular edema, left eye: Secondary | ICD-10-CM | POA: Diagnosis not present

## 2023-06-02 LAB — HM DIABETES EYE EXAM

## 2023-06-16 ENCOUNTER — Encounter: Payer: Self-pay | Admitting: *Deleted

## 2023-06-18 ENCOUNTER — Encounter: Payer: Self-pay | Admitting: Family Medicine

## 2023-06-18 DIAGNOSIS — E113299 Type 2 diabetes mellitus with mild nonproliferative diabetic retinopathy without macular edema, unspecified eye: Secondary | ICD-10-CM | POA: Insufficient documentation

## 2023-06-27 ENCOUNTER — Telehealth: Payer: Self-pay

## 2023-06-27 ENCOUNTER — Other Ambulatory Visit: Payer: Self-pay

## 2023-06-27 DIAGNOSIS — Z1211 Encounter for screening for malignant neoplasm of colon: Secondary | ICD-10-CM

## 2023-06-27 MED ORDER — NA SULFATE-K SULFATE-MG SULF 17.5-3.13-1.6 GM/177ML PO SOLN: 1.0000 | Freq: Once | ORAL | 0 refills | Status: AC

## 2023-06-27 NOTE — Telephone Encounter (Signed)
Gastroenterology Pre-Procedure Review  Request Date: 08/15/23 Requesting Physician: Dr. Tobi Bastos  PATIENT REVIEW QUESTIONS: The patient responded to the following health history questions as indicated:    1. Are you having any GI issues? no 2. Do you have a personal history of Polyps? no 3. Do you have a family history of Colon Cancer or Polyps? no 4. Diabetes Mellitus? yes (takes glipizide, ozempic, metformin advised of stop dates and noted on instructions) 5. Joint replacements in the past 12 months?no 6. Major health problems in the past 3 months?no 7. Any artificial heart valves, MVP, or defibrillator?no    MEDICATIONS & ALLERGIES:    Patient reports the following regarding taking any anticoagulation/antiplatelet therapy:   Plavix, Coumadin, Eliquis, Xarelto, Lovenox, Pradaxa, Brilinta, or Effient? no Aspirin? no  Patient confirms/reports the following medications:  Current Outpatient Medications  Medication Sig Dispense Refill   OZEMPIC, 0.25 OR 0.5 MG/DOSE, 2 MG/3ML SOPN      Accu-Chek FastClix Lancets MISC 1 each by Does not apply route as directed. Use as directed to check blood sugar 102 each 3   Blood Glucose Monitoring Suppl (ACCU-CHEK NANO SMARTVIEW) w/Device KIT 1 each by Does not apply route as directed. Use as directed to check blood sugar 1 kit 0   Cyanocobalamin (B-12) 1000 MCG SUBL Place 1 tablet under the tongue every other day.     ezetimibe (ZETIA) 10 MG tablet Take 1 tablet (10 mg total) by mouth daily. 90 tablet 4   gabapentin (NEURONTIN) 300 MG capsule Take 1 capsule (300 mg total) by mouth 3 (three) times daily. 270 capsule 4   glucose blood (ACCU-CHEK GUIDE) test strip Check sugar once daily. DX E11.40 100 strip 3   lisinopril (ZESTRIL) 5 MG tablet Take 1 tablet (5 mg total) by mouth daily. 90 tablet 4   metFORMIN (GLUCOPHAGE) 1000 MG tablet Take 1 tablet (1,000 mg total) by mouth 2 (two) times daily with a meal. 180 tablet 4   No current facility-administered  medications for this visit.    Patient confirms/reports the following allergies:  Allergies  Allergen Reactions   Trulicity [Dulaglutide] Other (See Comments)    Itchy welps at injection site to Trulicity, has tolerated Ozempic well in the past   Glipizide Other (See Comments)    hypoglycemia   Lipitor [Atorvastatin] Other (See Comments)    Bone aches   Lyrica [Pregabalin] Other (See Comments)    Didn't like how it made him feel, mood swings   Naprosyn [Naproxen] Other (See Comments)    Spots before pts eyes   Pravastatin Other (See Comments)    Body aches and joint pain   Rosuvastatin Nausea Only    GI upset, abd pain   Codeine Palpitations    No orders of the defined types were placed in this encounter.   AUTHORIZATION INFORMATION Primary Insurance: 1D#: Group #:  Secondary Insurance: 1D#: Group #:  SCHEDULE INFORMATION: Date: 08/15/23 Time: Location: [facility]

## 2023-08-04 DIAGNOSIS — E1169 Type 2 diabetes mellitus with other specified complication: Secondary | ICD-10-CM | POA: Diagnosis not present

## 2023-08-04 DIAGNOSIS — E1142 Type 2 diabetes mellitus with diabetic polyneuropathy: Secondary | ICD-10-CM | POA: Diagnosis not present

## 2023-08-04 DIAGNOSIS — E785 Hyperlipidemia, unspecified: Secondary | ICD-10-CM | POA: Diagnosis not present

## 2023-08-08 ENCOUNTER — Encounter: Payer: Self-pay | Admitting: Gastroenterology

## 2023-08-15 ENCOUNTER — Ambulatory Visit: Payer: Medicare Other | Admitting: General Practice

## 2023-08-15 ENCOUNTER — Encounter
Admission: RE | Disposition: A | Discharge status: Home / Self Care | Payer: Self-pay | Source: Home / Self Care | Attending: Gastroenterology

## 2023-08-15 ENCOUNTER — Ambulatory Visit
Admission: RE | Admit: 2023-08-15 | Discharge: 2023-08-15 | Disposition: A | Discharge status: Home / Self Care | Payer: Medicare Other | Attending: Gastroenterology | Admitting: Gastroenterology

## 2023-08-15 DIAGNOSIS — Z79899 Other long term (current) drug therapy: Secondary | ICD-10-CM | POA: Insufficient documentation

## 2023-08-15 DIAGNOSIS — K635 Polyp of colon: Secondary | ICD-10-CM | POA: Diagnosis not present

## 2023-08-15 DIAGNOSIS — Z833 Family history of diabetes mellitus: Secondary | ICD-10-CM | POA: Insufficient documentation

## 2023-08-15 DIAGNOSIS — E119 Type 2 diabetes mellitus without complications: Secondary | ICD-10-CM | POA: Diagnosis not present

## 2023-08-15 DIAGNOSIS — Z1211 Encounter for screening for malignant neoplasm of colon: Secondary | ICD-10-CM | POA: Diagnosis not present

## 2023-08-15 DIAGNOSIS — D122 Benign neoplasm of ascending colon: Secondary | ICD-10-CM | POA: Insufficient documentation

## 2023-08-15 DIAGNOSIS — D124 Benign neoplasm of descending colon: Secondary | ICD-10-CM | POA: Diagnosis not present

## 2023-08-15 DIAGNOSIS — D123 Benign neoplasm of transverse colon: Secondary | ICD-10-CM | POA: Insufficient documentation

## 2023-08-15 DIAGNOSIS — D126 Benign neoplasm of colon, unspecified: Secondary | ICD-10-CM

## 2023-08-15 DIAGNOSIS — Z0000 Encounter for general adult medical examination without abnormal findings: Secondary | ICD-10-CM

## 2023-08-15 HISTORY — PX: COLONOSCOPY WITH PROPOFOL: SHX5780

## 2023-08-15 HISTORY — PX: POLYPECTOMY: SHX5525

## 2023-08-15 LAB — GLUCOSE, CAPILLARY: Glucose-Capillary: 148 mg/dL — ABNORMAL HIGH (ref 70–99)

## 2023-08-15 SURGERY — COLONOSCOPY WITH PROPOFOL
Anesthesia: General

## 2023-08-15 MED ORDER — PROPOFOL 10 MG/ML IV BOLUS
INTRAVENOUS | Status: AC
  Filled 2023-08-15: qty 40

## 2023-08-15 MED ORDER — PROPOFOL 10 MG/ML IV BOLUS
INTRAVENOUS | Status: DC | PRN
  Administered 2023-08-15: 75 ug/kg/min via INTRAVENOUS
  Administered 2023-08-15: 80 mg via INTRAVENOUS

## 2023-08-15 MED ORDER — SODIUM CHLORIDE 0.9 % IV SOLN
INTRAVENOUS | Status: DC
  Administered 2023-08-15: 20 mL/h via INTRAVENOUS

## 2023-08-15 NOTE — Transfer of Care (Signed)
 Immediate Anesthesia Transfer of Care Note  Patient: Blake Manning  Procedure(s) Performed: COLONOSCOPY WITH PROPOFOL  POLYPECTOMY  Patient Location: PACU  Anesthesia Type:MAC  Level of Consciousness: awake  Airway & Oxygen Therapy: Patient Spontanous Breathing  Post-op Assessment: Report given to RN and Post -op Vital signs reviewed and stable  Post vital signs: Reviewed and stable  Last Vitals:  Vitals Value Taken Time  BP    Temp    Pulse    Resp    SpO2      Last Pain:  Vitals:   08/15/23 0749  TempSrc: Temporal  PainSc: 0-No pain         Complications: No notable events documented.

## 2023-08-15 NOTE — Anesthesia Preprocedure Evaluation (Signed)
 Anesthesia Evaluation  Patient identified by MRN, date of birth, ID band Patient awake    Reviewed: Allergy & Precautions, NPO status , Patient's Chart, lab work & pertinent test results  Airway Mallampati: III  TM Distance: >3 FB Neck ROM: full    Dental  (+) Chipped, Dental Advidsory Given   Pulmonary neg pulmonary ROS   Pulmonary exam normal        Cardiovascular negative cardio ROS Normal cardiovascular exam     Neuro/Psych negative neurological ROS  negative psych ROS   GI/Hepatic negative GI ROS, Neg liver ROS,,,  Endo/Other  negative endocrine ROSdiabetes    Renal/GU negative Renal ROS  negative genitourinary   Musculoskeletal   Abdominal   Peds  Hematology negative hematology ROS (+)   Anesthesia Other Findings Past Medical History: 01/2014: DDD (degenerative disc disease), lumbar     Comment:  severe by CT scan at L4/5 and mod at L3/4 and L5/S1,               severe R L4/5 and L L5/S1 neural foraminal narrowing 2010: Diabetes type 2, controlled (HCC)     Comment:  referred for DSME (05/2013) but pt did not return phone               call 02/16/2019: Diabetic foot ulcer associated with type 2 diabetes  mellitus (HCC)     Comment:  Resolved, saw podiatry. 01/2014: Fatty liver     Comment:  by CT scan 05/30/2023: History of diabetic ulcer of foot     Comment:  01/2023 saw podiatry   No date: History of kidney stones     Comment:  uric acid No date: Hyperlipidemia 2009: Renal cancer (HCC)     Comment:  part of R kidney removed (Dr. 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               complicated by pseudoaneurysm + bladder clots after               surgery  Past Surgical History: No date: COLONOSCOPY 1980s: KNEE SURGERY; Left     Comment:  ?meniscus 07/09/2007: PARTIAL NEPHRECTOMY; Right     Comment:  RCC, negative margins  BMI    Body Mass  Index: 26.63 kg/m      Reproductive/Obstetrics negative OB ROS                             Anesthesia Physical Anesthesia Plan  ASA: 2  Anesthesia Plan: General   Post-op Pain Management: Minimal or no pain anticipated   Induction: Intravenous  PONV Risk Score and Plan: 3 and Propofol  infusion, TIVA and Ondansetron  Airway Management Planned: Nasal Cannula  Additional Equipment: None  Intra-op Plan:   Post-operative Plan:   Informed Consent: I have reviewed the patients History and Physical, chart, labs and discussed the procedure including the risks, benefits and alternatives for the proposed anesthesia with the patient or authorized representative who has indicated his/her understanding and acceptance.     Dental advisory given  Plan Discussed with: CRNA and Surgeon  Anesthesia Plan Comments: (Discussed risks of anesthesia with patient, including possibility of difficulty with spontaneous ventilation under anesthesia necessitating airway intervention, PONV, and rare risks such as cardiac or respiratory or neurological events, and allergic reactions. Discussed the role of CRNA  in patient's perioperative care. Patient understands.)       Anesthesia Quick Evaluation

## 2023-08-15 NOTE — Op Note (Signed)
 [facility] Patient Name: Blake Manning Procedure Date: 08/15/2023 8:30 AM MRN: [id number] Account #: [id number] Date of Birth: 1956-01-27 Admit Type: Outpatient Age: 68 Room: [facility] ENDO ROOM 4 Gender: Male Note Status: Finalized Instrument Name: Arvis [id number] Procedure:             Colonoscopy Indications:           Screening for colorectal malignant neoplasm Providers:             Ruel Kung MD, MD Referring MD:          Anton Blas (Referring MD) Medicines:             Monitored Anesthesia Care Complications:         No immediate complications. Procedure:             Pre-Anesthesia Assessment:                        - Prior to the procedure, a History and Physical was                         performed, and patient medications, allergies and                         sensitivities were reviewed. The patient's tolerance                         of previous anesthesia was reviewed.                        - The risks and benefits of the procedure and the                         sedation options and risks were discussed with the                         patient. All questions were answered and informed                         consent was obtained.                        - ASA Grade Assessment: II - A patient with mild                         systemic disease.                        After obtaining informed consent, the colonoscope was                         passed under direct vision. Throughout the procedure,                         the patient's blood pressure, pulse, and oxygen                         saturations were monitored continuously. The                         Colonoscope was introduced through  the anus and                         advanced to the the cecum, identified by the                         appendiceal orifice. The colonoscopy was performed                         with ease. The patient tolerated the procedure well.                          The quality of the bowel preparation was excellent.                         The ileocecal valve, appendiceal orifice, and rectum                         were photographed. Findings:      The perianal and digital rectal examinations were normal.      Three sessile polyps were found in the descending colon, transverse       colon and ascending colon. The polyps were 5 to 6 mm in size. These       polyps were removed with a cold snare. Resection and retrieval were       complete.      The exam was otherwise without abnormality on direct and retroflexion       views. Impression:            - Three 5 to 6 mm polyps in the descending colon, in                         the transverse colon and in the ascending colon,                         removed with a cold snare. Resected and retrieved.                        - The examination was otherwise normal on direct and                         retroflexion views. Recommendation:        - Discharge patient to home (with escort).                        - Resume previous diet.                        - Continue present medications.                        - Await pathology results.                        - Repeat colonoscopy for surveillance based on                         pathology results. Procedure Code(s):     --- Professional ---  54614, Colonoscopy, flexible; with removal of                         tumor(s), polyp(s), or other lesion(s) by snare                         technique Diagnosis Code(s):     --- Professional ---                        Z12.11, Encounter for screening for malignant neoplasm                         of colon                        D12.4, Benign neoplasm of descending colon                        D12.3, Benign neoplasm of transverse colon (hepatic                         flexure or splenic flexure)                        D12.2, Benign neoplasm of ascending colon CPT copyright 2022  American Medical Association. All rights reserved. The codes documented in this report are preliminary and upon coder review may  be revised to meet current compliance requirements. Ruel Kung, MD Ruel Kung MD, MD 08/15/2023 8:52:30 AM This report has been signed electronically. Number of Addenda: 0 Note Initiated On: 08/15/2023 8:30 AM Scope Withdrawal Time: 0 hours 10 minutes 19 seconds  Total Procedure Duration: 0 hours 12 minutes 38 seconds  Estimated Blood Loss:  Estimated blood loss: none.      [facility]

## 2023-08-15 NOTE — H&P (Signed)
 Ruel Kung, MD [address], [address], [place], [place], [address] [address], [place], [place], [address] Phone: [phone]  Fax: [phone]  Primary Care Physician:  Rilla Baller, MD   Pre-Procedure History & Physical: HPI:  Blake Manning is a 68 y.o. male is here for an colonoscopy.   Past Medical History:  Diagnosis Date   DDD (degenerative disc disease), lumbar 01/2014   severe by CT scan at L4/5 and mod at L3/4 and L5/S1, severe R L4/5 and L L5/S1 neural foraminal narrowing   Diabetes type 2, controlled (HCC) 2010   referred for DSME (05/2013) but pt did not return phone call   Diabetic foot ulcer associated with type 2 diabetes mellitus (HCC) 02/16/2019   Resolved, saw podiatry.   Fatty liver 01/2014   by CT scan   History of diabetic ulcer of foot 05/30/2023   01/2023 saw podiatry     History of kidney stones    uric acid   Hyperlipidemia    Renal cancer (HCC) 2009   part of R kidney removed (Dr. 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 Authorizing Provider  Accu-Chek FastClix Lancets MISC 1 each by Does not apply route as directed. Use as directed to check blood sugar 01/14/23  Yes Rilla Baller, MD  Blood Glucose Monitoring Suppl (ACCU-CHEK NANO SMARTVIEW) w/Device KIT 1 each by Does not apply route as directed. Use as directed to check blood sugar 02/17/19  Yes Rilla Baller, MD  Cyanocobalamin  (B-12) 1000 MCG SUBL Place 1 tablet under the tongue every other day. 05/30/23  Yes Rilla Baller, MD  ezetimibe  (ZETIA ) 10 MG tablet Take 1 tablet (10 mg total) by mouth daily. 05/30/23  Yes  Rilla Baller, MD  gabapentin  (NEURONTIN ) 300 MG capsule Take 1 capsule (300 mg total) by mouth 3 (three) times daily. 05/30/23  Yes Rilla Baller, MD  glucose blood (ACCU-CHEK GUIDE) test strip Check sugar once daily. DX E11.40 01/14/23  Yes Rilla Baller, MD  lisinopril  (ZESTRIL ) 5 MG tablet Take 1 tablet (5 mg total) by mouth daily. 05/30/23  Yes Rilla Baller, MD  metFORMIN  (GLUCOPHAGE ) 1000 MG tablet Take 1 tablet (1,000 mg total) by mouth 2 (two) times daily with a meal. 05/30/23  Yes Rilla Baller, MD  OZEMPIC , 0.25 OR 0.5 MG/DOSE, 2 MG/3ML SOPN  06/11/23  Yes [provider]    Allergies as of 06/27/2023 - Review Complete 06/27/2023  Allergen Reaction Noted   Trulicity  [dulaglutide ] Other (See Comments) 10/03/2022   Glipizide  Other (See Comments) 01/05/2016   Lipitor [atorvastatin] Other (See Comments) 06/24/2013   Lyrica [pregabalin] Other (See Comments) 06/01/2014   Naprosyn  [naproxen ] Other (See Comments) 01/25/2014   Pravastatin  Other (See Comments) 09/21/2013   Rosuvastatin  Nausea Only 02/16/2019   Codeine Palpitations 06/24/2013    Family History  Problem Relation Age of Onset   Cancer Mother        lung (smoker)   Diabetes Mother    Cancer Maternal Aunt        lung (smoker)   Cancer Maternal Aunt        lung with  met to brain (smoker)   Valvular heart disease Son 30       extra heart valves s/p recent procedure   Hypertension Son    Hyperlipidemia Son    COPD Father    Diabetes Sister    Asthma Brother    Stroke Neg Hx     Social History   Socioeconomic History   Marital status: Married    Spouse name: Not on file   Number of children: Not on file   Years of education: Not on file   Highest education level: 12th grade  Occupational History   Not on file  Tobacco Use   Smoking status: Never   Smokeless tobacco: Never  Substance and Sexual Activity   Alcohol use: No    Alcohol/week: 0.0 standard drinks of alcohol   Drug  use: No   Sexual activity: Not on file  Other Topics Concern   Not on file  Social History Narrative   Lives with wife, 1 dog   Grown children   Musician - plays base    Occupation: retail banker   Edu: HS, going to join local gym   Diet: good water, fruits/vegetables daily   Social Drivers of Health   Financial Resource Strain: Low Risk  (05/30/2023)   Overall Financial Resource Strain (CARDIA)    Difficulty of Paying Living Expenses: Not hard at all  Food Insecurity: No Food Insecurity (05/30/2023)   Hunger Vital Sign    Worried About Running Out of Food in the Last Year: Never true    Ran Out of Food in the Last Year: Never true  Transportation Needs: No Transportation Needs (05/30/2023)   PRAPARE - Administrator, Civil Service (Medical): No    Lack of Transportation (Non-Medical): No  Physical Activity: Sufficiently Active (05/30/2023)   Exercise Vital Sign    Days of Exercise per Week: 5 days    Minutes of Exercise per Session: 60 min  Stress: No Stress Concern Present (05/30/2023)   [employer] of Occupational Health - Occupational Stress Questionnaire    Feeling of Stress : Not at all  Social Connections: Unknown (05/30/2023)   Social Connection and Isolation Panel [NHANES]    Frequency of Communication with Friends and Family: More than three times a week    Frequency of Social Gatherings with Friends and Family: More than three times a week    Attends Religious Services: More than 4 times per year    Active Member of [employer] or Organizations: Yes    Attends Engineer, Structural: More than 4 times per year    Marital Status: Not on file  Intimate Partner Violence: Not on file    Review of Systems: See HPI, otherwise negative ROS  Physical Exam: BP 123/82   Pulse 71   Temp (!) 96.9 F (36.1 C) (Temporal)   Resp 20   Ht 5' 6 (1.676 m)   Wt 74.8 kg   SpO2 99%   BMI 26.63 kg/m  General:   Alert,  pleasant and cooperative in  NAD Head:  Normocephalic and atraumatic. Neck:  Supple; no masses or thyromegaly. Lungs:  Clear throughout to auscultation, normal respiratory effort.    Heart:  +S1, +S2, Regular rate and rhythm, No edema. Abdomen:  Soft, nontender and nondistended. Normal bowel sounds, without guarding, and without rebound.   Neurologic:  Alert and  oriented x4;  grossly normal neurologically.  Impression/Plan: Blake Manning is here for an colonoscopy to be  performed for Screening colonoscopy average risk   Risks, benefits, limitations, and alternatives regarding  colonoscopy have been reviewed with the patient.  Questions have been answered.  All parties agreeable.   Ruel Kung, MD  08/15/2023, 7:58 AM

## 2023-08-15 NOTE — Anesthesia Postprocedure Evaluation (Signed)
 Anesthesia Post Note  Patient: Blake Manning  Procedure(s) Performed: COLONOSCOPY WITH PROPOFOL  POLYPECTOMY  Patient location during evaluation: PACU Anesthesia Type: General Level of consciousness: awake and alert Pain management: pain level controlled Vital Signs Assessment: post-procedure vital signs reviewed and stable Respiratory status: spontaneous breathing, nonlabored ventilation, respiratory function stable and patient connected to nasal cannula oxygen Cardiovascular status: blood pressure returned to baseline and stable Postop Assessment: no apparent nausea or vomiting Anesthetic complications: no  No notable events documented.   Last Vitals:  Vitals:   08/15/23 0749 08/15/23 0855  BP: 123/82 113/79  Pulse: 71 86  Resp: 20 19  Temp: (!) 36.1 C (!) 36.3 C  SpO2: 99% 99%    Last Pain:  Vitals:   08/15/23 0855  TempSrc: Temporal  PainSc: 0-No pain                 Debby Mines

## 2023-08-18 ENCOUNTER — Encounter: Payer: Self-pay | Admitting: Gastroenterology

## 2023-08-18 LAB — SURGICAL PATHOLOGY

## 2023-08-19 ENCOUNTER — Encounter: Payer: Self-pay | Admitting: Family Medicine

## 2023-08-19 ENCOUNTER — Encounter: Payer: Self-pay | Admitting: Gastroenterology

## 2023-10-27 DIAGNOSIS — E1142 Type 2 diabetes mellitus with diabetic polyneuropathy: Secondary | ICD-10-CM | POA: Diagnosis not present

## 2023-10-27 DIAGNOSIS — E1169 Type 2 diabetes mellitus with other specified complication: Secondary | ICD-10-CM | POA: Diagnosis not present

## 2023-10-27 DIAGNOSIS — E785 Hyperlipidemia, unspecified: Secondary | ICD-10-CM | POA: Diagnosis not present

## 2023-11-18 ENCOUNTER — Ambulatory Visit: Payer: Self-pay | Admitting: Internal Medicine

## 2023-11-28 ENCOUNTER — Ambulatory Visit: Payer: Medicare Other | Admitting: Family Medicine

## 2024-01-07 DIAGNOSIS — M19072 Primary osteoarthritis, left ankle and foot: Secondary | ICD-10-CM | POA: Diagnosis not present

## 2024-01-07 DIAGNOSIS — E1142 Type 2 diabetes mellitus with diabetic polyneuropathy: Secondary | ICD-10-CM | POA: Diagnosis not present

## 2024-01-07 DIAGNOSIS — M79672 Pain in left foot: Secondary | ICD-10-CM | POA: Diagnosis not present

## 2024-02-23 DIAGNOSIS — E785 Hyperlipidemia, unspecified: Secondary | ICD-10-CM | POA: Diagnosis not present

## 2024-02-23 DIAGNOSIS — E1169 Type 2 diabetes mellitus with other specified complication: Secondary | ICD-10-CM | POA: Diagnosis not present

## 2024-02-23 DIAGNOSIS — E1142 Type 2 diabetes mellitus with diabetic polyneuropathy: Secondary | ICD-10-CM | POA: Diagnosis not present

## 2024-04-07 NOTE — Progress Notes (Signed)
 Blake Manning                                          MRN: [id number]   04/07/2024   The [facility] Quality Team Specialist reviewed this patient medical record for the purposes of chart review for care gap closure. The following were reviewed: chart review for care gap closure-kidney health evaluation for diabetes:eGFR  and uACR.    [facility] Quality Team

## 2024-04-07 NOTE — Progress Notes (Signed)
 Blake Manning                                          MRN: [id number]   04/07/2024   The [facility] Quality Team Specialist reviewed this patient medical record for the purposes of chart review for care gap closure. The following were reviewed: abstraction for care gap closure-glycemic status assessment.    [facility]I Quality Team

## 2024-06-07 LAB — OPHTHALMOLOGY REPORT-SCANNED

## 2024-06-11 ENCOUNTER — Ambulatory Visit

## 2024-06-11 VITALS — Ht 65.25 in | Wt 165.0 lb

## 2024-06-11 DIAGNOSIS — Z0000 Encounter for general adult medical examination without abnormal findings: Secondary | ICD-10-CM | POA: Diagnosis not present

## 2024-06-11 NOTE — Patient Instructions (Addendum)
 [name],  Thank you for taking the time for your Medicare Wellness Visit. I appreciate your continued commitment to your health goals. Please review the care plan we discussed, and feel free to reach out if I can assist you further.  Please note that Annual Wellness Visits do not include a physical exam. Some assessments may be limited, especially if the visit was conducted virtually. If needed, we may recommend an in-person follow-up with your provider.  Ongoing Care Seeing your primary care provider every 3 to 6 months helps us  monitor your health and provide consistent, personalized care. Next office visit on 06/15/2024.  You are due for a Flu vaccine and can get that during your next office visit.  You are also due for a foot exam, a kidney evaluation, and a A1C check, which all can be done during your office visit.    Referrals If a referral was made during today's visit and you haven't received any updates within two weeks, please contact the referred provider directly to check on the status.  Recommended Screenings:  Health Maintenance  Topic Date Due   Yearly kidney health urinalysis for diabetes  Never done   Complete foot exam   07/17/2022   Hemoglobin A1C  11/23/2023   Flu Shot  02/06/2024   COVID-19 Vaccine (3 - 2025-26 season) 03/08/2024   Yearly kidney function blood test for diabetes  05/25/2024   Medicare Annual Wellness Visit  05/29/2024   Eye exam for diabetics  06/07/2025   Colon Cancer Screening  08/14/2026   DTaP/Tdap/Td vaccine (3 - Td or Tdap) 05/01/2032   Pneumococcal Vaccine for age over 39  Completed   Hepatitis C Screening  Completed   Zoster (Shingles) Vaccine  Completed   Meningitis B Vaccine  Aged Out       06/10/2024    1:16 PM  Advanced Directives  Does Patient Have a Medical Advance Directive? No    Vision: Annual vision screenings are recommended for early detection of glaucoma, cataracts, and diabetic retinopathy. These exams can also  reveal signs of chronic conditions such as diabetes and high blood pressure.  Dental: Annual dental screenings help detect early signs of oral cancer, gum disease, and other conditions linked to overall health, including heart disease and diabetes.  Please see the attached documents for additional preventive care recommendations.

## 2024-06-11 NOTE — Progress Notes (Deleted)
 Chief Complaint  Patient presents with   Medicare Wellness     Subjective:   Blake Manning is a 68 y.o. male who presents for a Medicare Annual Wellness Visit.  Visit info / Clinical Intake: Medicare Wellness Visit Type:: Subsequent Annual Wellness Visit Persons participating in visit and providing information:: patient Medicare Wellness Visit Mode:: Video Since this visit was completed virtually, some vitals may be partially provided or unavailable. Missing vitals are due to the limitations of the virtual format.: Unable to obtain vitals - no equipment If Telephone or Video please confirm:: I connected with patient using audio/video enable telemedicine. I verified patient identity with two identifiers, discussed telehealth limitations, and patient agreed to proceed. Patient Location:: Home Provider Location:: Office Interpreter Needed?: No Pre-visit prep was completed: yes AWV questionnaire completed by patient prior to visit?: yes Date:: 06/10/24 Living arrangements:: (Patient-Rptd) lives with spouse/significant other Patient's Overall Health Status Rating: (Patient-Rptd) good Typical amount of pain: (Patient-Rptd) some Does pain affect daily life?: (Patient-Rptd) no Are you currently prescribed opioids?: no  Dietary Habits and Nutritional Risks How many meals a day?: (Patient-Rptd) 3 Eats fruit and vegetables daily?: (!) (Patient-Rptd) no Most meals are obtained by: (Patient-Rptd) preparing own meals In the last 2 weeks, have you had any of the following?: none Diabetic:: (!) yes Any non-healing wounds?: no How often do you check your BS?: 1 (once a day-o every other day) Would you like to be referred to a Nutritionist or for Diabetic Management? : no  Functional Status Activities of Daily Living (to include ambulation/medication): (Patient-Rptd) Independent Ambulation: Independent with device- listed below Home Assistive Devices/Equipment: Eyeglasses Medication  Administration: (Patient-Rptd) Independent Home Management (perform basic housework or laundry): (Patient-Rptd) Independent Manage your own finances?: (Patient-Rptd) yes Primary transportation is: (Patient-Rptd) driving Concerns about vision?: no *vision screening is required for WTM* Concerns about hearing?: no (ringing in ears)  Fall Screening Falls in the past year?: (Patient-Rptd) 0 Number of falls in past year: 0 Was there an injury with Fall?: 0 Fall Risk Category Calculator: 0 Patient Fall Risk Level: Low Fall Risk  Fall Risk Patient at Risk for Falls Due to: No Fall Risks Fall risk Follow up: Falls evaluation completed; Falls prevention discussed  Home and Transportation Safety: All rugs have non-skid backing?: (!) (Patient-Rptd) no All stairs or steps have railings?: (Patient-Rptd) N/A, no stairs Grab bars in the bathtub or shower?: (Patient-Rptd) yes Have non-skid surface in bathtub or shower?: (Patient-Rptd) yes Good home lighting?: (Patient-Rptd) yes Regular seat belt use?: (Patient-Rptd) yes Hospital stays in the last year:: (Patient-Rptd) no  Cognitive Assessment Difficulty concentrating, remembering, or making decisions? : (Patient-Rptd) no Will 6CIT or Mini Cog be Completed: no 6CIT or Mini Cog Declined: patient alert, oriented, able to answer questions appropriately and recall recent events  Advance Directives (For Healthcare) Does Patient Have a Medical Advance Directive?: No  Reviewed/Updated  Reviewed/Updated: Reviewed All (Medical, Surgical, Family, Medications, Allergies, Care Teams, Patient Goals)    Allergies (verified) Trulicity  [dulaglutide ], Glipizide , Lipitor [atorvastatin], Lyrica [pregabalin], Naprosyn  [naproxen ], Pravastatin , Rosuvastatin , and Codeine   Current Medications (verified) Outpatient Encounter Medications as of 06/11/2024  Medication Sig   Accu-Chek FastClix Lancets MISC 1 each by Does not apply route as directed. Use as directed  to check blood sugar   Blood Glucose Monitoring Suppl (ACCU-CHEK NANO SMARTVIEW) w/Device KIT 1 each by Does not apply route as directed. Use as directed to check blood sugar   Cyanocobalamin  (B-12) 1000 MCG SUBL Place 1 tablet under the  tongue every other day.   ezetimibe  (ZETIA ) 10 MG tablet Take 1 tablet (10 mg total) by mouth daily.   gabapentin  (NEURONTIN ) 300 MG capsule Take 1 capsule (300 mg total) by mouth 3 (three) times daily.   glucose blood (ACCU-CHEK GUIDE) test strip Check sugar once daily. DX E11.40   OZEMPIC , 0.25 OR 0.5 MG/DOSE, 2 MG/3ML SOPN    lisinopril  (ZESTRIL ) 5 MG tablet Take 1 tablet (5 mg total) by mouth daily.   metFORMIN  (GLUCOPHAGE ) 1000 MG tablet Take 1 tablet (1,000 mg total) by mouth 2 (two) times daily with a meal.   No facility-administered encounter medications on file as of 06/11/2024.    History: Past Medical History:  Diagnosis Date   DDD (degenerative disc disease), lumbar 01/2014   severe by CT scan at L4/5 and mod at L3/4 and L5/S1, severe R L4/5 and L L5/S1 neural foraminal narrowing   Diabetes type 2, controlled (HCC) 2010   referred for DSME (05/2013) but pt did not return phone call   Diabetic foot ulcer associated with type 2 diabetes mellitus (HCC) 02/16/2019   Resolved, saw podiatry.   Fatty liver 01/2014   by CT scan   History of diabetic ulcer of foot 05/30/2023   01/2023 saw podiatry     History of kidney stones    uric acid   Hyperlipidemia    Renal cancer (HCC) 2009   part of R kidney removed (Dr. 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   Past Surgical History:  Procedure Laterality Date   COLONOSCOPY     COLONOSCOPY WITH PROPOFOL  N/A 08/15/2023   TAx3 rpt 5 yrs Romero Bi, MD)   KNEE SURGERY Left 1980s   ?meniscus   PARTIAL NEPHRECTOMY Right 07/09/2007   RCC, negative margins   POLYPECTOMY  08/15/2023   Procedure: POLYPECTOMY;   Surgeon: Therisa Bi, MD;  Location: [facility] ENDOSCOPY;  Service: Gastroenterology;;   Family History  Problem Relation Age of Onset   Cancer Mother        lung (smoker)   Diabetes Mother    Cancer Maternal Aunt        lung (smoker)   Cancer Maternal Aunt        lung with met to brain (smoker)   Valvular heart disease Son 30       extra heart valves s/p recent procedure   Hypertension Son    Hyperlipidemia Son    COPD Father    Diabetes Sister    Asthma Brother    Stroke Neg Hx    Social History   Occupational History   Occupation: RETIRED  Tobacco Use   Smoking status: Never   Smokeless tobacco: Never  Vaping Use   Vaping status: Never Used  Substance and Sexual Activity   Alcohol use: No    Alcohol/week: 0.0 standard drinks of alcohol   Drug use: No   Sexual activity: Not on file   Tobacco Counseling Counseling given: Not Answered  SDOH Screenings   Food Insecurity: No Food Insecurity (06/10/2024)  Housing: Low Risk  (06/10/2024)  Transportation Needs: No Transportation Needs (06/10/2024)  Utilities: Not At Risk (04/27/2023)   Received from [facility]  Depression [phone]): Low Risk  (06/11/2024)  Financial Resource Strain: Low Risk  (06/10/2024)  Physical Activity: Sufficiently Active (06/10/2024)  Social Connections: Unknown (06/10/2024)  Stress: No Stress Concern Present (06/10/2024)  Tobacco Use: Low Risk  (  06/11/2024)  Health Literacy: Adequate Health Literacy (06/11/2024)   See flowsheets for full screening details  Depression Screen PHQ 2 & 9 Depression Scale- Over the past 2 weeks, how often have you been bothered by any of the following problems? Little interest or pleasure in doing things: 0 Feeling down, depressed, or hopeless (PHQ Adolescent also includes...irritable): 0 PHQ-2 Total Score: 0 Trouble falling or staying asleep, or sleeping too much: 0 Feeling tired or having little energy: 0 Poor appetite or overeating (PHQ Adolescent  also includes...weight loss): 0 Feeling bad about yourself - or that you are a failure or have let yourself or your family down: 0 Trouble concentrating on things, such as reading the newspaper or watching television (PHQ Adolescent also includes...like school work): 0 Moving or speaking so slowly that other people could have noticed. Or the opposite - being so fidgety or restless that you have been moving around a lot more than usual: 0 Thoughts that you would be better off dead, or of hurting yourself in some way: 0 PHQ-9 Total Score: 0 If you checked off any problems, how difficult have these problems made it for you to do your work, take care of things at home, or get along with other people?: Not difficult at all  Depression Treatment Depression Interventions/Treatment : EYV7-0 Score <4 Follow-up Not Indicated     Goals Addressed   None          Objective:    Today's Vitals   06/11/24 1005  Weight: 165 lb (74.8 kg)  Height: 5' 5.25 (1.657 m)   Body mass index is 27.25 kg/m.  Hearing/Vision screen Hearing Screening - Comments:: Ringing in the ears-per pt Vision Screening - Comments:: Wears eyeglasses/ UTD/ Dr. Cammie Immunizations and Health Maintenance Health Maintenance  Topic Date Due   Diabetic kidney evaluation - Urine ACR  Never done   FOOT EXAM  07/17/2022   HEMOGLOBIN A1C  11/23/2023   Influenza Vaccine  02/06/2024   COVID-19 Vaccine (3 - 2025-26 season) 03/08/2024   Diabetic kidney evaluation - eGFR measurement  05/25/2024   OPHTHALMOLOGY EXAM  06/07/2025   Medicare Annual Wellness (AWV)  06/11/2025   Colonoscopy  08/14/2026   DTaP/Tdap/Td (3 - Td or Tdap) 05/01/2032   Pneumococcal Vaccine: 50+ Years  Completed   Hepatitis C Screening  Completed   Zoster Vaccines- Shingrix  Completed   Meningococcal B Vaccine  Aged Out        Assessment/Plan:  This is a routine wellness examination for Elsie.  Patient Care Team: Rilla Baller, MD as PCP -  General (Family Medicine) Cammie Batters, OD as Referring Physician (Optometry)  I have personally reviewed and noted the following in the patient's chart:   Medical and social history Use of alcohol, tobacco or illicit drugs  Current medications and supplements including opioid prescriptions. Functional ability and status Nutritional status Physical activity Advanced directives List of other physicians Hospitalizations, surgeries, and ER visits in previous 12 months Vitals Screenings to include cognitive, depression, and falls Referrals and appointments  No orders of the defined types were placed in this encounter.  In addition, I have reviewed and discussed with patient certain preventive protocols, quality metrics, and best practice recommendations. A written personalized care plan for preventive services as well as general preventive health recommendations were provided to patient.   Kimiye Strathman L Arleny Kruger, CMA   06/11/2024   Return in 1 year (on 06/11/2025).  After Visit Summary: (MyChart) Due to this being a telephonic visit,  the after visit summary with patients personalized plan was offered to patient via MyChart   Nurse Notes: Patient is due for a foot exam, a A1C check, a UACR, a eGFR, and a Flu vaccine.  Patient would like to get these items done during his next office visit.  Patient had no other concerns to address today.

## 2024-06-14 NOTE — Progress Notes (Deleted)
 Chief Complaint  Patient presents with   Medicare Wellness     Subjective:   Blake Manning is a 68 y.o. male who presents for a Medicare Annual Wellness Visit.  Visit info / Clinical Intake: Medicare Wellness Visit Type:: Subsequent Annual Wellness Visit Persons participating in visit and providing information:: patient Medicare Wellness Visit Mode:: Video Since this visit was completed virtually, some vitals may be partially provided or unavailable. Missing vitals are due to the limitations of the virtual format.: Unable to obtain vitals - no equipment If Telephone or Video please confirm:: I connected with patient using audio/video enable telemedicine. I verified patient identity with two identifiers, discussed telehealth limitations, and patient agreed to proceed. Patient Location:: Home Provider Location:: Office Interpreter Needed?: No Pre-visit prep was completed: yes AWV questionnaire completed by patient prior to visit?: yes Date:: 06/10/24 Living arrangements:: (Patient-Rptd) lives with spouse/significant other Patient's Overall Health Status Rating: (Patient-Rptd) good Typical amount of pain: (Patient-Rptd) some Does pain affect daily life?: (Patient-Rptd) no Are you currently prescribed opioids?: no  Dietary Habits and Nutritional Risks How many meals a day?: (Patient-Rptd) 3 Eats fruit and vegetables daily?: (!) (Patient-Rptd) no Most meals are obtained by: (Patient-Rptd) preparing own meals In the last 2 weeks, have you had any of the following?: none Diabetic:: (!) yes Any non-healing wounds?: no How often do you check your BS?: 1 (once a day-o every other day) Would you like to be referred to a Nutritionist or for Diabetic Management? : no  Functional Status Activities of Daily Living (to include ambulation/medication): (Patient-Rptd) Independent Ambulation: Independent with device- listed below Home Assistive Devices/Equipment: Eyeglasses Medication  Administration: (Patient-Rptd) Independent Home Management (perform basic housework or laundry): (Patient-Rptd) Independent Manage your own finances?: (Patient-Rptd) yes Primary transportation is: (Patient-Rptd) driving Concerns about vision?: no *vision screening is required for WTM* Concerns about hearing?: no (ringing in ears)  Fall Screening Falls in the past year?: (Patient-Rptd) 0 Number of falls in past year: 0 Was there an injury with Fall?: 0 Fall Risk Category Calculator: 0 Patient Fall Risk Level: Low Fall Risk  Fall Risk Patient at Risk for Falls Due to: No Fall Risks Fall risk Follow up: Falls evaluation completed; Falls prevention discussed  Home and Transportation Safety: All rugs have non-skid backing?: (!) (Patient-Rptd) no All stairs or steps have railings?: (Patient-Rptd) N/A, no stairs Grab bars in the bathtub or shower?: (Patient-Rptd) yes Have non-skid surface in bathtub or shower?: (Patient-Rptd) yes Good home lighting?: (Patient-Rptd) yes Regular seat belt use?: (Patient-Rptd) yes Hospital stays in the last year:: (Patient-Rptd) no  Cognitive Assessment Difficulty concentrating, remembering, or making decisions? : (Patient-Rptd) no Will 6CIT or Mini Cog be Completed: no 6CIT or Mini Cog Declined: patient alert, oriented, able to answer questions appropriately and recall recent events  Advance Directives (For Healthcare) Does Patient Have a Medical Advance Directive?: No  Reviewed/Updated  Reviewed/Updated: Reviewed All (Medical, Surgical, Family, Medications, Allergies, Care Teams, Patient Goals)    Allergies (verified) Trulicity  [dulaglutide ], Glipizide , Lipitor [atorvastatin], Lyrica [pregabalin], Naprosyn  [naproxen ], Pravastatin , Rosuvastatin , and Codeine   Current Medications (verified) Outpatient Encounter Medications as of 06/11/2024  Medication Sig   Accu-Chek FastClix Lancets MISC 1 each by Does not apply route as directed. Use as directed  to check blood sugar   Blood Glucose Monitoring Suppl (ACCU-CHEK NANO SMARTVIEW) w/Device KIT 1 each by Does not apply route as directed. Use as directed to check blood sugar   Cyanocobalamin  (B-12) 1000 MCG SUBL Place 1 tablet under the  tongue every other day.   ezetimibe  (ZETIA ) 10 MG tablet Take 1 tablet (10 mg total) by mouth daily.   gabapentin  (NEURONTIN ) 300 MG capsule Take 1 capsule (300 mg total) by mouth 3 (three) times daily.   glucose blood (ACCU-CHEK GUIDE) test strip Check sugar once daily. DX E11.40   OZEMPIC , 0.25 OR 0.5 MG/DOSE, 2 MG/3ML SOPN    lisinopril  (ZESTRIL ) 5 MG tablet Take 1 tablet (5 mg total) by mouth daily.   metFORMIN  (GLUCOPHAGE ) 1000 MG tablet Take 1 tablet (1,000 mg total) by mouth 2 (two) times daily with a meal.   No facility-administered encounter medications on file as of 06/11/2024.    History: Past Medical History:  Diagnosis Date   DDD (degenerative disc disease), lumbar 01/2014   severe by CT scan at L4/5 and mod at L3/4 and L5/S1, severe R L4/5 and L L5/S1 neural foraminal narrowing   Diabetes type 2, controlled (HCC) 2010   referred for DSME (05/2013) but pt did not return phone call   Diabetic foot ulcer associated with type 2 diabetes mellitus (HCC) 02/16/2019   Resolved, saw podiatry.   Fatty liver 01/2014   by CT scan   History of diabetic ulcer of foot 05/30/2023   01/2023 saw podiatry     History of kidney stones    uric acid   Hyperlipidemia    Renal cancer (HCC) 2009   part of R kidney removed (Dr. 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   Past Surgical History:  Procedure Laterality Date   COLONOSCOPY     COLONOSCOPY WITH PROPOFOL  N/A 08/15/2023   TAx3 rpt 5 yrs Romero Bi, MD)   KNEE SURGERY Left 1980s   ?meniscus   PARTIAL NEPHRECTOMY Right 07/09/2007   RCC, negative margins   POLYPECTOMY  08/15/2023   Procedure: POLYPECTOMY;   Surgeon: Therisa Bi, MD;  Location: [facility] ENDOSCOPY;  Service: Gastroenterology;;   Family History  Problem Relation Age of Onset   Cancer Mother        lung (smoker)   Diabetes Mother    Cancer Maternal Aunt        lung (smoker)   Cancer Maternal Aunt        lung with met to brain (smoker)   Valvular heart disease Son 30       extra heart valves s/p recent procedure   Hypertension Son    Hyperlipidemia Son    COPD Father    Diabetes Sister    Asthma Brother    Stroke Neg Hx    Social History   Occupational History   Occupation: RETIRED  Tobacco Use   Smoking status: Never   Smokeless tobacco: Never  Vaping Use   Vaping status: Never Used  Substance and Sexual Activity   Alcohol use: No    Alcohol/week: 0.0 standard drinks of alcohol   Drug use: No   Sexual activity: Not on file   Tobacco Counseling Counseling given: Not Answered  SDOH Screenings   Food Insecurity: No Food Insecurity (06/10/2024)  Housing: Low Risk  (06/10/2024)  Transportation Needs: No Transportation Needs (06/10/2024)  Utilities: Not At Risk (06/11/2024)  Depression (PHQ2-9): Low Risk  (06/11/2024)  Financial Resource Strain: Low Risk  (06/10/2024)  Physical Activity: Sufficiently Active (06/10/2024)  Social Connections: Unknown (06/10/2024)  Stress: No Stress Concern Present (06/10/2024)  Tobacco Use: Low Risk  (06/11/2024)  Health Literacy: Adequate Health Literacy (06/11/2024)  See flowsheets for full screening details  Depression Screen PHQ 2 & 9 Depression Scale- Over the past 2 weeks, how often have you been bothered by any of the following problems? Little interest or pleasure in doing things: 0 Feeling down, depressed, or hopeless (PHQ Adolescent also includes...irritable): 0 PHQ-2 Total Score: 0 Trouble falling or staying asleep, or sleeping too much: 0 Feeling tired or having little energy: 0 Poor appetite or overeating (PHQ Adolescent also includes...weight loss): 0 Feeling bad about  yourself - or that you are a failure or have let yourself or your family down: 0 Trouble concentrating on things, such as reading the newspaper or watching television (PHQ Adolescent also includes...like school work): 0 Moving or speaking so slowly that other people could have noticed. Or the opposite - being so fidgety or restless that you have been moving around a lot more than usual: 0 Thoughts that you would be better off dead, or of hurting yourself in some way: 0 PHQ-9 Total Score: 0 If you checked off any problems, how difficult have these problems made it for you to do your work, take care of things at home, or get along with other people?: Not difficult at all  Depression Treatment Depression Interventions/Treatment : EYV7-0 Score <4 Follow-up Not Indicated     Goals Addressed   None          Objective:    Today's Vitals   06/11/24 1005  Weight: 165 lb (74.8 kg)  Height: 5' 5.25 (1.657 m)   Body mass index is 27.25 kg/m.  Hearing/Vision screen Hearing Screening - Comments:: Ringing in the ears-per pt Vision Screening - Comments:: Wears eyeglasses/ UTD/ Dr. Cammie Immunizations and Health Maintenance Health Maintenance  Topic Date Due   Diabetic kidney evaluation - Urine ACR  Never done   FOOT EXAM  07/17/2022   HEMOGLOBIN A1C  11/23/2023   Influenza Vaccine  02/06/2024   COVID-19 Vaccine (3 - 2025-26 season) 03/08/2024   Diabetic kidney evaluation - eGFR measurement  05/25/2024   OPHTHALMOLOGY EXAM  06/07/2025   Medicare Annual Wellness (AWV)  06/11/2025   Colonoscopy  08/14/2026   DTaP/Tdap/Td (3 - Td or Tdap) 05/01/2032   Pneumococcal Vaccine: 50+ Years  Completed   Hepatitis C Screening  Completed   Zoster Vaccines- Shingrix  Completed   Meningococcal B Vaccine  Aged Out        Assessment/Plan:  This is a routine wellness examination for Blake Manning.  Patient Care Team: Rilla Baller, MD as PCP - General (Family Medicine) Cammie Batters, OD as  Referring Physician (Optometry)  I have personally reviewed and noted the following in the patient's chart:   Medical and social history Use of alcohol, tobacco or illicit drugs  Current medications and supplements including opioid prescriptions. Functional ability and status Nutritional status Physical activity Advanced directives List of other physicians Hospitalizations, surgeries, and ER visits in previous 12 months Vitals Screenings to include cognitive, depression, and falls Referrals and appointments  No orders of the defined types were placed in this encounter.  In addition, I have reviewed and discussed with patient certain preventive protocols, quality metrics, and best practice recommendations. A written personalized care plan for preventive services as well as general preventive health recommendations were provided to patient.   Yehoshua Vitelli L Babita Amaker, CMA   06/11/2024  Return in 1 year (on 06/11/2025).  After Visit Summary: (MyChart) Due to this being a telephonic visit, the after visit summary with patients personalized plan was offered to  patient via MyChart   Nurse Notes: Patient is due for a foot exam, a A1C check, a UACR, a eGFR, and a Flu vaccine.  Patient would like to get these items done during his next office visit.  Patient had no other concerns to address today.

## 2024-06-15 ENCOUNTER — Encounter: Payer: Self-pay | Admitting: Family Medicine

## 2024-06-15 ENCOUNTER — Ambulatory Visit: Admitting: Family Medicine

## 2024-06-15 VITALS — BP 120/78 | HR 68 | Temp 98.1°F | Ht 65.25 in | Wt 163.0 lb

## 2024-06-15 DIAGNOSIS — Z125 Encounter for screening for malignant neoplasm of prostate: Secondary | ICD-10-CM

## 2024-06-15 DIAGNOSIS — Z0000 Encounter for general adult medical examination without abnormal findings: Secondary | ICD-10-CM

## 2024-06-15 DIAGNOSIS — E663 Overweight: Secondary | ICD-10-CM

## 2024-06-15 DIAGNOSIS — Z23 Encounter for immunization: Secondary | ICD-10-CM | POA: Diagnosis not present

## 2024-06-15 DIAGNOSIS — H93A3 Pulsatile tinnitus, bilateral: Secondary | ICD-10-CM

## 2024-06-15 DIAGNOSIS — R7989 Other specified abnormal findings of blood chemistry: Secondary | ICD-10-CM

## 2024-06-15 DIAGNOSIS — T466X5A Adverse effect of antihyperlipidemic and antiarteriosclerotic drugs, initial encounter: Secondary | ICD-10-CM

## 2024-06-15 DIAGNOSIS — K760 Fatty (change of) liver, not elsewhere classified: Secondary | ICD-10-CM

## 2024-06-15 DIAGNOSIS — E785 Hyperlipidemia, unspecified: Secondary | ICD-10-CM | POA: Diagnosis not present

## 2024-06-15 DIAGNOSIS — D563 Thalassemia minor: Secondary | ICD-10-CM

## 2024-06-15 DIAGNOSIS — E1169 Type 2 diabetes mellitus with other specified complication: Secondary | ICD-10-CM

## 2024-06-15 DIAGNOSIS — R0989 Other specified symptoms and signs involving the circulatory and respiratory systems: Secondary | ICD-10-CM

## 2024-06-15 DIAGNOSIS — Z85528 Personal history of other malignant neoplasm of kidney: Secondary | ICD-10-CM

## 2024-06-15 DIAGNOSIS — Z7189 Other specified counseling: Secondary | ICD-10-CM

## 2024-06-15 DIAGNOSIS — G720 Drug-induced myopathy: Secondary | ICD-10-CM

## 2024-06-15 DIAGNOSIS — Z87442 Personal history of urinary calculi: Secondary | ICD-10-CM

## 2024-06-15 LAB — CBC WITH DIFFERENTIAL/PLATELET
Basophils Absolute: 0.1 K/uL (ref 0.0–0.1)
Basophils Relative: 1.1 % (ref 0.0–3.0)
Eosinophils Absolute: 0.1 K/uL (ref 0.0–0.7)
Eosinophils Relative: 1.7 % (ref 0.0–5.0)
HCT: 42.6 % (ref 39.0–52.0)
Hemoglobin: 13.6 g/dL (ref 13.0–17.0)
Lymphocytes Relative: 32.9 % (ref 12.0–46.0)
Lymphs Abs: 2.6 K/uL (ref 0.7–4.0)
MCHC: 31.9 g/dL (ref 30.0–36.0)
MCV: 70.1 fl — ABNORMAL LOW (ref 78.0–100.0)
Monocytes Absolute: 0.7 K/uL (ref 0.1–1.0)
Monocytes Relative: 8.5 % (ref 3.0–12.0)
Neutro Abs: 4.3 K/uL (ref 1.4–7.7)
Neutrophils Relative %: 55.8 % (ref 43.0–77.0)
Platelets: 282 K/uL (ref 150.0–400.0)
RBC: 6.07 Mil/uL — ABNORMAL HIGH (ref 4.22–5.81)
RDW: 15.3 % (ref 11.5–15.5)
WBC: 7.8 K/uL (ref 4.0–10.5)

## 2024-06-15 LAB — URINALYSIS, ROUTINE W REFLEX MICROSCOPIC
Bilirubin Urine: NEGATIVE
Hgb urine dipstick: NEGATIVE
Leukocytes,Ua: NEGATIVE
Nitrite: NEGATIVE
Specific Gravity, Urine: 1.015 (ref 1.000–1.030)
Total Protein, Urine: NEGATIVE
Urine Glucose: >=1000 — AB
Urobilinogen, UA: 0.2 (ref 0.0–1.0)
WBC, UA: NONE SEEN (ref 0–?)
pH: 6 (ref 5.0–8.0)

## 2024-06-15 LAB — LIPID PANEL
Cholesterol: 208 mg/dL — ABNORMAL HIGH (ref 0–200)
HDL: 60.6 mg/dL (ref >39.00)
LDL Cholesterol: 113 mg/dL — ABNORMAL HIGH (ref 0–99)
NonHDL: 147.88
Total CHOL/HDL Ratio: 3
Triglycerides: 172 mg/dL — ABNORMAL HIGH (ref 0.0–149.0)
VLDL: 34.4 mg/dL (ref 0.0–40.0)

## 2024-06-15 LAB — COMPREHENSIVE METABOLIC PANEL WITH GFR
ALT: 30 U/L (ref 0–53)
AST: 20 U/L (ref 0–37)
Albumin: 5.1 g/dL (ref 3.5–5.2)
Alkaline Phosphatase: 70 U/L (ref 39–117)
BUN: 16 mg/dL (ref 6–23)
CO2: 29 meq/L (ref 19–32)
Calcium: 10.5 mg/dL (ref 8.4–10.5)
Chloride: 101 meq/L (ref 96–112)
Creatinine, Ser: 0.97 mg/dL (ref 0.40–1.50)
GFR: 81.53 mL/min (ref >60.00)
Glucose, Bld: 135 mg/dL — ABNORMAL HIGH (ref 70–99)
Potassium: 4 meq/L (ref 3.5–5.1)
Sodium: 141 meq/L (ref 135–145)
Total Bilirubin: 0.6 mg/dL (ref 0.2–1.2)
Total Protein: 7.8 g/dL (ref 6.0–8.3)

## 2024-06-15 LAB — MICROALBUMIN / CREATININE URINE RATIO
Creatinine,U: 67 mg/dL
Microalb Creat Ratio: 20.9 mg/g (ref 0.0–30.0)
Microalb, Ur: 1.4 mg/dL (ref 0.0–1.9)

## 2024-06-15 LAB — VITAMIN B12: Vitamin B-12: 495 pg/mL (ref 211–911)

## 2024-06-15 LAB — HEMOGLOBIN A1C: Hgb A1c MFr Bld: 7.4 % — ABNORMAL HIGH (ref 4.6–6.5)

## 2024-06-15 LAB — PSA: PSA: 0.22 ng/mL (ref 0.10–4.00)

## 2024-06-15 MED ORDER — GABAPENTIN 300 MG PO CAPS: 600.0000 mg | ORAL_CAPSULE | Freq: Every day | ORAL | 3 refills | Status: AC

## 2024-06-15 NOTE — Assessment & Plan Note (Signed)
 Preventative protocols reviewed and updated unless pt declined. Discussed healthy diet and lifestyle.

## 2024-06-15 NOTE — Assessment & Plan Note (Signed)
 Chronic, mild, statin and zetia  intolerance. Update FLP, consider PCSK9i.  Consider cardiac CT with calcium  score.  The 10-year ASCVD risk score (Arnett DK, et al., 2019) is: 20.7%   Values used to calculate the score:     Age: 68 years     Clincally relevant sex: Male     Is Non-Hispanic African American: No     Diabetic: Yes     Tobacco smoker: No     Systolic Blood Pressure: 120 mmHg     Is BP treated: No     HDL Cholesterol: 50.8 mg/dL     Total Cholesterol: 178 mg/dL

## 2024-06-15 NOTE — Assessment & Plan Note (Signed)
 Congratulated on weight loss to date - down 9 lbs since last physical.  He is now on ozempic  2mg  and jardiance 25mg . He stopped metformin .

## 2024-06-15 NOTE — Progress Notes (Signed)
 Chief Complaint  Patient presents with   Medicare Wellness     Subjective:   Blake Manning is a 68 y.o. male who presents for a Medicare Annual Wellness Visit.  Visit info / Clinical Intake: Medicare Wellness Visit Type:: Initial Annual Wellness Visit Persons participating in visit and providing information:: patient Medicare Wellness Visit Mode:: Video Since this visit was completed virtually, some vitals may be partially provided or unavailable. Missing vitals are due to the limitations of the virtual format.: Unable to obtain vitals - no equipment If Telephone or Video please confirm:: I connected with patient using audio/video enable telemedicine. I verified patient identity with two identifiers, discussed telehealth limitations, and patient agreed to proceed. Patient Location:: Home Provider Location:: Office Interpreter Needed?: No Pre-visit prep was completed: yes AWV questionnaire completed by patient prior to visit?: yes Date:: 06/10/24 Living arrangements:: (Patient-Rptd) lives with spouse/significant other Patient's Overall Health Status Rating: (Patient-Rptd) good Typical amount of pain: (Patient-Rptd) some Does pain affect daily life?: (Patient-Rptd) no Are you currently prescribed opioids?: no  Dietary Habits and Nutritional Risks How many meals a day?: (Patient-Rptd) 3 Eats fruit and vegetables daily?: (!) (Patient-Rptd) no Most meals are obtained by: (Patient-Rptd) preparing own meals In the last 2 weeks, have you had any of the following?: none Diabetic:: (!) yes Any non-healing wounds?: no How often do you check your BS?: 1 (once a day-o every other day) Would you like to be referred to a Nutritionist or for Diabetic Management? : no  Functional Status Activities of Daily Living (to include ambulation/medication): (Patient-Rptd) Independent Ambulation: Independent with device- listed below Home Assistive Devices/Equipment: Eyeglasses Medication  Administration: (Patient-Rptd) Independent Home Management (perform basic housework or laundry): (Patient-Rptd) Independent Manage your own finances?: (Patient-Rptd) yes Primary transportation is: (Patient-Rptd) driving Concerns about vision?: no *vision screening is required for WTM* Concerns about hearing?: no (ringing in ears)  Fall Screening Falls in the past year?: (Patient-Rptd) 0 Number of falls in past year: 0 Was there an injury with Fall?: 0 Fall Risk Category Calculator: 0 Patient Fall Risk Level: Low Fall Risk  Fall Risk Patient at Risk for Falls Due to: No Fall Risks Fall risk Follow up: Falls evaluation completed; Falls prevention discussed  Home and Transportation Safety: All rugs have non-skid backing?: (!) (Patient-Rptd) no All stairs or steps have railings?: (Patient-Rptd) N/A, no stairs Grab bars in the bathtub or shower?: (Patient-Rptd) yes Have non-skid surface in bathtub or shower?: (Patient-Rptd) yes Good home lighting?: (Patient-Rptd) yes Regular seat belt use?: (Patient-Rptd) yes Hospital stays in the last year:: (Patient-Rptd) no  Cognitive Assessment Difficulty concentrating, remembering, or making decisions? : (Patient-Rptd) no Will 6CIT or Mini Cog be Completed: no 6CIT or Mini Cog Declined: patient alert, oriented, able to answer questions appropriately and recall recent events  Advance Directives (For Healthcare) Does Patient Have a Medical Advance Directive?: No  Reviewed/Updated  Reviewed/Updated: Reviewed All (Medical, Surgical, Family, Medications, Allergies, Care Teams, Patient Goals)    Allergies (verified) Trulicity  [dulaglutide ], Glipizide , Lipitor [atorvastatin], Lyrica [pregabalin], Naprosyn  [naproxen ], Pravastatin , Rosuvastatin , and Codeine   Current Medications (verified) Outpatient Encounter Medications as of 06/11/2024  Medication Sig   Accu-Chek FastClix Lancets MISC 1 each by Does not apply route as directed. Use as directed  to check blood sugar   Blood Glucose Monitoring Suppl (ACCU-CHEK NANO SMARTVIEW) w/Device KIT 1 each by Does not apply route as directed. Use as directed to check blood sugar   Cyanocobalamin  (B-12) 1000 MCG SUBL Place 1 tablet under the  tongue every other day.   ezetimibe  (ZETIA ) 10 MG tablet Take 1 tablet (10 mg total) by mouth daily.   gabapentin  (NEURONTIN ) 300 MG capsule Take 1 capsule (300 mg total) by mouth 3 (three) times daily.   glucose blood (ACCU-CHEK GUIDE) test strip Check sugar once daily. DX E11.40   OZEMPIC , 0.25 OR 0.5 MG/DOSE, 2 MG/3ML SOPN    lisinopril  (ZESTRIL ) 5 MG tablet Take 1 tablet (5 mg total) by mouth daily.   metFORMIN  (GLUCOPHAGE ) 1000 MG tablet Take 1 tablet (1,000 mg total) by mouth 2 (two) times daily with a meal.   No facility-administered encounter medications on file as of 06/11/2024.    History: Past Medical History:  Diagnosis Date   DDD (degenerative disc disease), lumbar 01/2014   severe by CT scan at L4/5 and mod at L3/4 and L5/S1, severe R L4/5 and L L5/S1 neural foraminal narrowing   Diabetes type 2, controlled (HCC) 2010   referred for DSME (05/2013) but pt did not return phone call   Diabetic foot ulcer associated with type 2 diabetes mellitus (HCC) 02/16/2019   Resolved, saw podiatry.   Fatty liver 01/2014   by CT scan   History of diabetic ulcer of foot 05/30/2023   01/2023 saw podiatry     History of kidney stones    uric acid   Hyperlipidemia    Renal cancer (HCC) 2009   part of R kidney removed (Dr. 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   Past Surgical History:  Procedure Laterality Date   COLONOSCOPY     COLONOSCOPY WITH PROPOFOL  N/A 08/15/2023   TAx3 rpt 5 yrs Romero Bi, MD)   KNEE SURGERY Left 1980s   ?meniscus   PARTIAL NEPHRECTOMY Right 07/09/2007   RCC, negative margins   POLYPECTOMY  08/15/2023   Procedure: POLYPECTOMY;   Surgeon: Therisa Bi, MD;  Location: [facility] ENDOSCOPY;  Service: Gastroenterology;;   Family History  Problem Relation Age of Onset   Cancer Mother        lung (smoker)   Diabetes Mother    Cancer Maternal Aunt        lung (smoker)   Cancer Maternal Aunt        lung with met to brain (smoker)   Valvular heart disease Son 30       extra heart valves s/p recent procedure   Hypertension Son    Hyperlipidemia Son    COPD Father    Diabetes Sister    Asthma Brother    Stroke Neg Hx    Social History   Occupational History   Occupation: RETIRED  Tobacco Use   Smoking status: Never   Smokeless tobacco: Never  Vaping Use   Vaping status: Never Used  Substance and Sexual Activity   Alcohol use: No    Alcohol/week: 0.0 standard drinks of alcohol   Drug use: No   Sexual activity: Not on file   Tobacco Counseling Counseling given: Not Answered  SDOH Screenings   Food Insecurity: No Food Insecurity (06/10/2024)  Housing: Low Risk  (06/10/2024)  Transportation Needs: No Transportation Needs (06/10/2024)  Utilities: Not At Risk (06/11/2024)  Depression (PHQ2-9): Low Risk  (06/11/2024)  Financial Resource Strain: Low Risk  (06/10/2024)  Physical Activity: Sufficiently Active (06/10/2024)  Social Connections: Unknown (06/10/2024)  Stress: No Stress Concern Present (06/10/2024)  Tobacco Use: Low Risk  (06/11/2024)  Health Literacy: Adequate Health Literacy (06/11/2024)  See flowsheets for full screening details  Depression Screen PHQ 2 & 9 Depression Scale- Over the past 2 weeks, how often have you been bothered by any of the following problems? Little interest or pleasure in doing things: 0 Feeling down, depressed, or hopeless (PHQ Adolescent also includes...irritable): 0 PHQ-2 Total Score: 0 Trouble falling or staying asleep, or sleeping too much: 0 Feeling tired or having little energy: 0 Poor appetite or overeating (PHQ Adolescent also includes...weight loss): 0 Feeling bad about  yourself - or that you are a failure or have let yourself or your family down: 0 Trouble concentrating on things, such as reading the newspaper or watching television (PHQ Adolescent also includes...like school work): 0 Moving or speaking so slowly that other people could have noticed. Or the opposite - being so fidgety or restless that you have been moving around a lot more than usual: 0 Thoughts that you would be better off dead, or of hurting yourself in some way: 0 PHQ-9 Total Score: 0 If you checked off any problems, how difficult have these problems made it for you to do your work, take care of things at home, or get along with other people?: Not difficult at all  Depression Treatment Depression Interventions/Treatment : EYV7-0 Score <4 Follow-up Not Indicated     Goals Addressed   None          Objective:    Today's Vitals   06/11/24 1005  Weight: 165 lb (74.8 kg)  Height: 5' 5.25 (1.657 m)   Body mass index is 27.25 kg/m.  Hearing/Vision screen Hearing Screening - Comments:: Ringing in the ears-per pt Vision Screening - Comments:: Wears eyeglasses/ UTD/ Dr. Cammie Immunizations and Health Maintenance Health Maintenance  Topic Date Due   Diabetic kidney evaluation - Urine ACR  Never done   FOOT EXAM  07/17/2022   HEMOGLOBIN A1C  11/23/2023   Influenza Vaccine  02/06/2024   COVID-19 Vaccine (3 - 2025-26 season) 03/08/2024   Diabetic kidney evaluation - eGFR measurement  05/25/2024   OPHTHALMOLOGY EXAM  06/07/2025   Medicare Annual Wellness (AWV)  06/11/2025   Colonoscopy  08/14/2026   DTaP/Tdap/Td (3 - Td or Tdap) 05/01/2032   Pneumococcal Vaccine: 50+ Years  Completed   Hepatitis C Screening  Completed   Zoster Vaccines- Shingrix  Completed   Meningococcal B Vaccine  Aged Out        Assessment/Plan:  This is a routine wellness examination for Elsie.  Patient Care Team: Rilla Baller, MD as PCP - General (Family Medicine) Cammie Batters, OD as  Referring Physician (Optometry)  I have personally reviewed and noted the following in the patient's chart:   Medical and social history Use of alcohol, tobacco or illicit drugs  Current medications and supplements including opioid prescriptions. Functional ability and status Nutritional status Physical activity Advanced directives List of other physicians Hospitalizations, surgeries, and ER visits in previous 12 months Vitals Screenings to include cognitive, depression, and falls Referrals and appointments  No orders of the defined types were placed in this encounter.  In addition, I have reviewed and discussed with patient certain preventive protocols, quality metrics, and best practice recommendations. A written personalized care plan for preventive services as well as general preventive health recommendations were provided to patient.   Derreck Wiltsey L Akita Maxim, CMA   06/11/2024  Return in 1 year (on 06/11/2025).  After Visit Summary: (MyChart) Due to this being a telephonic visit, the after visit summary with patients personalized plan was offered to  patient via MyChart   Nurse Notes: Patient is due for a foot exam, a A1C check, a UACR, a eGFR, and a Flu vaccine.  Patient would like to get these items done during his next office visit.  Patient had no other concerns to address today.

## 2024-06-15 NOTE — Assessment & Plan Note (Signed)
 Saw [facility] with reassuring evaluation 2021 including CTA head/neck Notes ongoing difficulty. Offered MRA head/neck for further evaluation however he has h/o claustrophobia to MRIs.  Start with carotid US  to eval L bruit heard today.

## 2024-06-15 NOTE — Assessment & Plan Note (Addendum)
 Intolerant to multiple statins tried.  Also intolerant to zetia . Consider PCSK9i pending FLP results today

## 2024-06-15 NOTE — Assessment & Plan Note (Addendum)
 Advanced directive - has not completed. Would want wife Dagoberto to be HCPOA. Will work on this - planning to go through clinical research associate, bring us  copy when completed.

## 2024-06-15 NOTE — Progress Notes (Signed)
 Ph: [phone] Fax: [phone]   Patient ID: Blake Manning, male    DOB: Nov 29, 1955, 68 y.o.   MRN: [id number]  This visit was conducted in person.  BP 120/78 (Cuff Size: Normal)   Pulse 68   Temp 98.1 F (36.7 C) (Oral)   Ht 5' 5.25 (1.657 m)   Wt 163 lb (73.9 kg)   SpO2 97%   BMI 26.92 kg/m    CC: CPE Subjective:   HPI: Blake Manning is a 68 y.o. male presenting on 06/15/2024 for Annual Exam (Pt quit taking metformin  a month ago on his own, states he was feeling bad between the combo of his db meds and immediatly felt better after stopping metformin . His DB doctor is not yet aware, pt states he will inform his doc//Pt states he had tick stuck to his body this past year that he did not notice for a while and is concerned because since then he has not wanted red meat )   Brings book he wrote from his medical journey for kidney cancer s/p treatment and survival [facility].   Saw health advisor last week for medicare wellness visit. Note reviewed.   No results found.  Flowsheet Row Office Visit from 06/15/2024 in [facility] [place]  PHQ-2 Total Score 0       06/15/2024   11:15 AM 06/10/2024    1:16 PM 05/30/2023    2:07 PM 11/14/2015    4:34 PM  Fall Risk   Falls in the past year? 0 0 0 Yes   Number falls in past yr: 0 0  1   Injury with Fall? 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 H/o kidney stones, none recently   Over summer has had several tick bites, one was attached for a prolonged period. He has had decreased appetite to red meat (more likely ozempic  effect) - but no symptoms of alpha gal allergy or tick borne illness.   T2DM - established with [facility] endo Dr Cherilyn  last seen 02/2024, A1c at that time 8%. He stopped metformin  on his own last month due to not feeling well while on it (anorexia, loose stools, GI upset) - felt better after 2 days. Otherwise continues jardiance 25mg  daily and ozempic  2 mg weekly. H/o diabetic R foot ulcer 01/2023 that healed well on its own. Continues gabapentin  300mg  1-2 at night. Uses accuchek glucometer to monitor sugars.  He stopped lisinopril  due to nausea.  Diabetic eye exam 06/07/2024  HLD - statin intolerance even lovastatin  (moody), zetia  intolerance (loopy).   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 Longterm musician.   Preventative: COLONOSCOPY WITH PROPOFOL  08/15/2023 - TAx3, rpt 5 yrs Romero Bi, MD) Prostate cancer screening - previously saw urologist Bishop) h/o mild enlargement. Nocturia x0-1.  Lung cancer screening - not eligible  Flu shot - today COVID vaccine J&J 11/2019. Pfizer booster 07/2020.  Tetanus shot - 2011, Tdap 04/2022.  Pneumovax - 2015. prevnar 20 05/2023 Shingrix - 06/2020, 10/2020.  Advanced directive - has not completed. Would want wife Blake Manning to be HCPOA. Will work on this - planning to go through clinical research associate, bring us  copy when completed.  Seat belt use discussed  Sunscreen use discussed, no changing moles on skin.  Non smoker  Alcohol - none  Dentist q6 mo Eye exam yearly - yearly  Bowel - no constipation  Bladder - no incontinence   Lives with wife, 1 dog   Grown children   Occupation: retail banker  Edu: HS Activity: walking daily, playing pickle ball and tennis. 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  Diet: good water and flavored water, fruits/vegetables daily      Relevant past medical, surgical, family and social history reviewed and updated as indicated. Interim medical history since our last visit reviewed. Allergies and medications reviewed and updated. Outpatient Medications Prior to Visit  Medication Sig Dispense Refill   Accu-Chek FastClix Lancets  MISC 1 each by Does not apply route as directed. Use as directed to check blood sugar 102 each 3   Blood Glucose Monitoring Suppl (ACCU-CHEK NANO SMARTVIEW) w/Device KIT 1 each by Does not apply route as directed. Use as directed to check blood sugar 1 kit 0   Cyanocobalamin  (B-12) 1000 MCG SUBL Place 1 tablet under the tongue every other day.     glucose blood (ACCU-CHEK GUIDE) test strip Check sugar once daily. DX E11.40 100 strip 3   JARDIANCE 25 MG TABS tablet Take 25 mg by mouth daily.     Semaglutide , 2 MG/DOSE, 8 MG/3ML SOPN Inject 2 mg as directed once a week.     ezetimibe  (ZETIA ) 10 MG tablet Take 1 tablet (10 mg total) by mouth daily. 90 tablet 4   gabapentin  (NEURONTIN ) 300 MG capsule Take 1 capsule (300 mg total) by mouth 3 (three) times daily. 270 capsule 4   OZEMPIC , 0.25 OR 0.5 MG/DOSE, 2 MG/3ML SOPN      lisinopril  (ZESTRIL ) 5 MG tablet Take 1 tablet (5 mg total) by mouth daily. 90 tablet 4   metFORMIN  (GLUCOPHAGE ) 1000 MG tablet Take 1 tablet (1,000 mg total) by mouth 2 (two) times daily with a meal. 180 tablet 4   No facility-administered medications prior to visit.     Per HPI unless specifically indicated in ROS section below Review of Systems  Constitutional:  Positive for appetite change. Negative for activity change, chills, fatigue, fever and unexpected weight change.  HENT:  Negative for hearing loss.   Eyes:  Negative for visual disturbance.  Respiratory:  Negative for cough, chest tightness, shortness of breath and wheezing.   Cardiovascular:  Negative for chest pain, palpitations and leg swelling.  Gastrointestinal:  Positive for abdominal pain (recent GI upset) and nausea. Negative for abdominal distention, blood in stool, constipation, diarrhea and vomiting.  Genitourinary:  Negative for difficulty urinating and hematuria.  Musculoskeletal:  Negative for arthralgias, myalgias and neck pain.  Skin:  Negative for rash.  Neurological:  Negative for dizziness,  seizures, syncope and headaches.  Hematological:  Negative for adenopathy. Does not bruise/bleed easily.  Psychiatric/Behavioral:  Negative for dysphoric mood. The patient is not nervous/anxious.     Objective:  BP 120/78 (Cuff Size: Normal)   Pulse 68   Temp 98.1 F (36.7 C) (Oral)   Ht 5' 5.25 (1.657 m)   Wt 163 lb (73.9 kg)   SpO2 97%   BMI 26.92 kg/m   Wt Readings from Last 3 Encounters:  06/15/24 163 lb (73.9 kg)  06/11/24 165 lb (74.8 kg)  08/15/23 165 lb (74.8 kg)      Physical Exam Vitals and nursing note reviewed.  Constitutional:      General: He  is not in acute distress.    Appearance: Normal appearance. He is well-developed. He is not ill-appearing.  HENT:     Head: Normocephalic and atraumatic.     Right Ear: Hearing, tympanic membrane, ear canal and external ear normal.     Left Ear: Hearing, tympanic membrane, ear canal and external ear normal.     Mouth/Throat:     Mouth: Mucous membranes are moist.     Pharynx: Oropharynx is clear. No oropharyngeal exudate or posterior oropharyngeal erythema.  Eyes:     General: No scleral icterus.    Extraocular Movements: Extraocular movements intact.     Conjunctiva/sclera: Conjunctivae normal.     Pupils: Pupils are equal, round, and reactive to light.  Neck:     Thyroid : No thyroid  mass or thyromegaly.     Vascular: Carotid bruit (left sided) present.  Cardiovascular:     Rate and Rhythm: Normal rate and regular rhythm.     Pulses: Normal pulses.          Radial pulses are 2+ on the right side and 2+ on the left side.     Heart sounds: Normal heart sounds. No murmur heard. Pulmonary:     Effort: Pulmonary effort is normal. No respiratory distress.     Breath sounds: Normal breath sounds. No wheezing, rhonchi or rales.  Abdominal:     General: Bowel sounds are normal. There is no distension.     Palpations: Abdomen is soft. There is no mass.     Tenderness: There is no abdominal tenderness. There is no  guarding or rebound.     Hernia: No hernia is present.  Musculoskeletal:        General: Normal range of motion.     Cervical back: Normal range of motion and neck supple.     Right lower leg: No edema.     Left lower leg: No edema.  Lymphadenopathy:     Cervical: No cervical adenopathy.  Skin:    General: Skin is warm and dry.     Findings: No rash.  Neurological:     General: No focal deficit present.     Mental Status: He is alert and oriented to person, place, and time.  Psychiatric:        Mood and Affect: Mood normal.        Behavior: Behavior normal.        Thought Content: Thought content normal.        Judgment: Judgment normal.    Diabetic Foot Exam - Simple   Simple Foot Form Diabetic Foot exam was performed with the following findings: Yes 06/15/2024 11:44 AM  Visual Inspection No deformities, no ulcerations, no other skin breakdown bilaterally: Yes See comments: Yes Sensation Testing See comments: Yes Pulse Check Posterior Tibialis and Dorsalis pulse intact bilaterally: Yes Comments No claudication Diminished pulses to monofilament testing  Calluses bilaterally        Results for orders placed or performed in visit on 06/08/24  OPHTHALMOLOGY REPORT-SCANNED   Collection Time: 06/07/24 10:34 AM  Result Value Ref Range   HM Diabetic Eye Exam No Retinopathy No Retinopathy   A Comment     No results found for: CKTOTAL  Assessment & Plan:   Problem List Items Addressed This Visit     Health maintenance examination - Primary (Chronic)   Preventative protocols reviewed and updated unless pt declined. Discussed healthy diet and lifestyle.       Advanced care planning/counseling discussion (Chronic)  Advanced directive - has not completed. Would want wife Blake Manning to be HCPOA. Will work on this - planning to go through clinical research associate, bring us  copy when completed.       History of renal cell cancer   Update UA       Relevant Orders   Urinalysis, Routine w  reflex microscopic   Type 2 diabetes mellitus with other specified complication (HCC)   Chronic, followed by endocrinology. Currently on ozempic  2mg  weekly and jardiance 25mg  daily.  He stopped metformin  1000mg  bid 1 month ago due to malaise, GI upset. Encouraged he notify endocrinology about this.  Notes sugars have been running overall well controlled. Foot exam today.  Diabetes complicated by HLD, B12 deficiency, h/o diabetic retinopathy (normal on latest check), and h/o diabetic foot ulcer.       Relevant Medications   JARDIANCE 25 MG TABS tablet   Semaglutide , 2 MG/DOSE, 8 MG/3ML SOPN   Other Relevant Orders   Hemoglobin A1c   Microalbumin / creatinine urine ratio   Vitamin B12   Hyperlipidemia associated with type 2 diabetes mellitus (HCC)   Chronic, mild, statin and zetia  intolerance. Update FLP, consider PCSK9i.  Consider cardiac CT with calcium  score.  The 10-year ASCVD risk score (Arnett DK, et al., 2019) is: 20.7%   Values used to calculate the score:     Age: 80 years     Clincally relevant sex: Male     Is Non-Hispanic African American: No     Diabetic: Yes     Tobacco smoker: No     Systolic Blood Pressure: 120 mmHg     Is BP treated: No     HDL Cholesterol: 50.8 mg/dL     Total Cholesterol: 178 mg/dL       Relevant Medications   JARDIANCE 25 MG TABS tablet   Semaglutide , 2 MG/DOSE, 8 MG/3ML SOPN   Other Relevant Orders   Lipid panel   Comprehensive metabolic panel with GFR   History of kidney stones   No recurrence.       Fatty liver   H/o fatty liver by CT scan.  Anticipate MASLD that may have improved with recent weight loss - update LFTs with CBC.       Relevant Orders   Comprehensive metabolic panel with GFR   CBC with Differential/Platelet   Overweight with body mass index (BMI) 25.0-29.9   Congratulated on weight loss to date - down 9 lbs since last physical.  He is now on ozempic  2mg  and jardiance 25mg . He stopped metformin .       Thalassemia minor   Relevant Orders   CBC with Differential/Platelet   Low vitamin B12 level   Update levels on every other day OTC vit b12 replacement.      Relevant Orders   Vitamin B12   Pulsatile tinnitus of both ears   Saw [facility] with reassuring evaluation 2021 including CTA head/neck Notes ongoing difficulty. Offered MRA head/neck for further evaluation however he has h/o claustrophobia to MRIs.  Start with carotid US  to eval L bruit heard today.      Adverse effect of statin   Intolerant to multiple statins tried.  Also intolerant to zetia . Consider PCSK9i pending FLP results today       Other Visit Diagnoses       Special screening for malignant neoplasm of prostate       Relevant Orders   PSA     Bruit of left carotid artery  Relevant Orders   VAS US  CAROTID     Encounter for immunization       Relevant Orders   Flu vaccine HIGH DOSE PF(Fluzone Trivalent) (Completed)        Meds ordered this encounter  Medications   gabapentin  (NEURONTIN ) 300 MG capsule    Sig: Take 2 capsules (600 mg total) by mouth at bedtime.    Dispense:  180 capsule    Refill:  3    Orders Placed This Encounter  Procedures   Flu vaccine HIGH DOSE PF(Fluzone Trivalent)   Lipid panel   Comprehensive metabolic panel with GFR   Hemoglobin A1c   Microalbumin / creatinine urine ratio   CBC with Differential/Platelet   PSA   Vitamin B12   Urinalysis, Routine w reflex microscopic    Patient Instructions  Flu shot today Labs today  I will order carotid ultrasound at [facility].  We should consider brain MRI/MRA  Good to see you today  Return as needed or in 1 year for next wellness visit/physical   Follow up plan: Return in about 1 year (around 06/15/2025) for annual exam, prior fasting for blood work, medicare wellness visit.  Anton Blas, MD

## 2024-06-15 NOTE — Assessment & Plan Note (Signed)
 Update levels on every other day OTC vit b12 replacement.

## 2024-06-15 NOTE — Assessment & Plan Note (Addendum)
 H/o fatty liver by CT scan.  Anticipate MASLD that may have improved with recent weight loss - update LFTs with CBC.

## 2024-06-15 NOTE — Patient Instructions (Addendum)
 Flu shot today Labs today  I will order carotid ultrasound at [facility].  We should consider brain MRI/MRA  Good to see you today  Return as needed or in 1 year for next wellness visit/physical

## 2024-06-15 NOTE — Assessment & Plan Note (Addendum)
 No recurrence.

## 2024-06-15 NOTE — Assessment & Plan Note (Addendum)
 Chronic, followed by endocrinology. Currently on ozempic  2mg  weekly and jardiance 25mg  daily.  He stopped metformin  1000mg  bid 1 month ago due to malaise, GI upset. Encouraged he notify endocrinology about this.  Notes sugars have been running overall well controlled. Foot exam today.  Diabetes complicated by HLD, B12 deficiency, h/o diabetic retinopathy (normal on latest check), and h/o diabetic foot ulcer.

## 2024-06-15 NOTE — Assessment & Plan Note (Signed)
Update UA.  

## 2024-06-21 ENCOUNTER — Ambulatory Visit: Payer: Self-pay | Admitting: Family Medicine

## 2024-06-21 DIAGNOSIS — H93A3 Pulsatile tinnitus, bilateral: Secondary | ICD-10-CM

## 2024-07-23 ENCOUNTER — Ambulatory Visit: Payer: Self-pay

## 2024-07-23 NOTE — Telephone Encounter (Signed)
 Left message to call office.  Is it the left or right shoulder? The message is contradicting.  Need to R/O cardiac if the left side. Is he having headache, jaw pain, nausea?  If not, he may want to to to [facility] in [place] or [place]. Tey are open 7 days a week. 8am to 8pm. Weekends may be different times.   Please provide message from provider/office when call is returned from patient.

## 2024-07-23 NOTE — Telephone Encounter (Signed)
 Tried to call him, again. No answer. Did not leave a message.

## 2024-07-23 NOTE — Telephone Encounter (Signed)
 FYI Only or Action Required?: FYI only for provider: appointment scheduled on 1/19.  Patient was last seen in primary care on 06/15/2024 by Rilla Baller, MD.  Called Nurse Triage reporting Shoulder Pain.  Symptoms began several weeks ago.  Interventions attempted: OTC medications: Tylenol .  Symptoms are: gradually worsening.  Triage Disposition: See Physician Within 24 Hours  Patient/caregiver understands and will follow disposition?: Yes, but will wait   Summary: Red Word: Extreme Pain   Reason for Triage: Patient called in stating that he is having extreme pain in his left shoulder. He states its a stabbing pain and its also radiating down into his arm. Patient states that the pain is worsening and he is hoping to have Dr. Ludwig examine it. Dr. KANDICE next available wont be until 07/29/2024.         Reason for Disposition  Numbness (i.e., loss of sensation) in hand or fingers  Answer Assessment - Initial Assessment Questions Declines appt in another office today.    1. ONSET: When did the pain start?     Few weeks ago 2. LOCATION: Where is the pain located?     Rt shoulder 3. PAIN: How bad is the pain? (Scale 1-10; or mild, moderate, severe)     moderate 4. WORK OR EXERCISE: Has there been any recent work or exercise that involved this part of the body?     unknown 5. CAUSE: What do you think is causing the shoulder pain?     unknown 6. OTHER SYMPTOMS: Do you have any other symptoms? (e.g., neck pain, swelling, rash, fever, numbness, weakness)     Numbness of pinky and ring finger  Protocols used: Shoulder Pain-A-AH

## 2024-07-26 ENCOUNTER — Ambulatory Visit: Payer: Self-pay | Admitting: Family Medicine

## 2024-07-26 ENCOUNTER — Encounter: Payer: Self-pay | Admitting: Family Medicine

## 2024-07-26 ENCOUNTER — Ambulatory Visit: Admitting: Family Medicine

## 2024-07-26 ENCOUNTER — Ambulatory Visit
Admission: RE | Admit: 2024-07-26 | Discharge: 2024-07-26 | Disposition: A | Source: Ambulatory Visit | Attending: Family Medicine | Admitting: Family Medicine

## 2024-07-26 VITALS — BP 122/60 | HR 69 | Temp 98.0°F | Ht 65.25 in | Wt 161.5 lb

## 2024-07-26 DIAGNOSIS — R0789 Other chest pain: Secondary | ICD-10-CM

## 2024-07-26 DIAGNOSIS — M5090 Cervical disc disorder, unspecified, unspecified cervical region: Secondary | ICD-10-CM | POA: Diagnosis not present

## 2024-07-26 DIAGNOSIS — M25521 Pain in right elbow: Secondary | ICD-10-CM | POA: Insufficient documentation

## 2024-07-26 DIAGNOSIS — R911 Solitary pulmonary nodule: Secondary | ICD-10-CM

## 2024-07-26 DIAGNOSIS — M25511 Pain in right shoulder: Secondary | ICD-10-CM

## 2024-07-26 MED ORDER — MELOXICAM 15 MG PO TABS: 15.0000 mg | ORAL_TABLET | Freq: Every day | ORAL | 0 refills | Status: AC | PRN

## 2024-07-26 NOTE — Assessment & Plan Note (Signed)
 Incidental  Cxr  8 mm at left base  Radiology suggestes correlation with chest CT

## 2024-07-26 NOTE — Assessment & Plan Note (Addendum)
 Pain is in the shoulder area Painful when arm is dependent but surprisingly good rom on exam , including int/ext rotation  Neg rotator cuff signs   Also neck pain (DDD) Also some chest wall pain-imaging pending   Xray today noted mild deg changes  Meloxicam  15 mg daily with food/short course (normal GFR noted)  If no improvement would consider sport med eval  Encouraged use of cold compress

## 2024-07-26 NOTE — Patient Instructions (Addendum)
 Start using ice on painful areas , especially the elbow and shoulder    Heat may be more helpful on neck   Try the meloxicam  15 mg once daily as needed with food for pain and inflammation  Alert us  if side effects  Do not mix with over the counter nsaids like aleve     Xray now - chest/ chest wall and shoulder   We will reach out with results

## 2024-07-26 NOTE — Assessment & Plan Note (Addendum)
 Worse discomfort with extension  Reviewed past notes and imaging   Unclear if this adds to shoulder and elbow pain   Prescription meloxicam  15 mg short course given (with food) Noted last GFR normal   Continues gabapentin  600 mg at bedtime

## 2024-07-26 NOTE — Assessment & Plan Note (Addendum)
 Pt c/o right sided cw pain and tenderness- just lateral to upper sternum on right  Reassuring exam other than mild tenderness (normal bs and no skin change)  Xray ordered: noting no acute rib changes or acute cardiopulm abn ,  saw 8 mm nodular opacity at left base and suggested CT  ? Strain of muscle or cartilage

## 2024-07-26 NOTE — Progress Notes (Signed)
 "  Subjective:    Patient ID: Blake Manning, male    DOB: 1955-10-14, 69 y.o.   MRN: [id number]  HPI  Wt Readings from Last 3 Encounters:  07/26/24 161 lb 8 oz (73.3 kg)  06/15/24 163 lb (73.9 kg)  06/11/24 165 lb (74.8 kg)   26.67 kg/m  Vitals:   07/26/24 1444  BP: 122/60  Pulse: 69  Temp: 98 F (36.7 C)  SpO2: 98%    69 yo pt of Dr KANDICE presents with  Shoulder pain on right For several weeks   Is Left handed (for writing)  Uses right for everything else   No trauma or injury   Is active Walks daily  Pickle ball  Some farm work   Pain is right upper chest - through to the shoulder blade  Radiates over his whole shoulder and at times to the arm /below the elbow  Sharp in nature   Cannot get comfortable  Can hear cracking   Worst - to stand still or lie on right side  Lying on left is less painful  Extending neck sends pain down     Pmhx notable for  DM2 Cervical and lumbar disk dz  Renal cell cancer  Thalassemia   Takes gabapentin  for neuropathy   Over the counter Aleve  - 2 pills prn  Over the counter topical product (? Diclofenac gel)  No heat or ice     Lab Results  Component Value Date   NA 141 06/15/2024   K 4.0 06/15/2024   CO2 29 06/15/2024   GLUCOSE 135 (H) 06/15/2024   BUN 16 06/15/2024   CREATININE 0.97 06/15/2024   CALCIUM  10.5 06/15/2024   GFR 81.53 06/15/2024   GFRNONAA >60 04/24/2020   Lab Results  Component Value Date   WBC 7.8 06/15/2024   HGB 13.6 06/15/2024   HCT 42.6 06/15/2024   MCV 70.1 (L) 06/15/2024   PLT 282.0 06/15/2024   Lab Results  Component Value Date   ALT 30 06/15/2024   AST 20 06/15/2024   ALKPHOS 70 06/15/2024   BILITOT 0.6 06/15/2024    Imaging today   DG Shoulder Right Result Date: 07/26/2024 CLINICAL DATA:  Pain in the superior shoulder EXAM: RIGHT SHOULDER - 2+ VIEW COMPARISON:  None Available. FINDINGS: No fracture or malalignment. Mild AC joint and glenohumeral degenerative change.  IMPRESSION: Mild degenerative changes. Electronically Signed   By: Luke Bun M.D.   On: 07/26/2024 16:05   DG Ribs Unilateral W/Chest Right Result Date: 07/26/2024 CLINICAL DATA:  Tenderness, chest wall pain EXAM: RIGHT RIBS AND CHEST - 3+ VIEW COMPARISON:  None Available. FINDINGS: No fracture or other bone lesions are seen involving the ribs. There is no evidence of pneumothorax or pleural effusion. Both lungs are clear. Small 8 mm nodular focus at the left lung base. Heart size and mediastinal contours are within normal limits. Coils or clips in the right upper quadrant. Probable left kidney stones. IMPRESSION: 1. No radiographic evidence for acute cardiopulmonary abnormality. No acute displaced right rib fracture. 2. 8 mm nodular opacity at the left base, suggest correlation with chest CT Electronically Signed   By: Luke Bun M.D.   On: 07/26/2024 16:04      Patient Active Problem List   Diagnosis Date Noted   Right shoulder pain 07/26/2024   Right elbow pain 07/26/2024   Chest wall pain 07/26/2024   Lung nodule 07/26/2024   Adenomatous polyp of colon 08/15/2023   Background diabetic retinopathy  associated with type 2 diabetes mellitus (HCC) 06/18/2023   Medicare annual wellness visit, subsequent 05/30/2023   History of diabetic ulcer of foot 05/30/2023   Diabetic cataract, associated with type 2 diabetes mellitus (HCC) 06/30/2019   Advanced care planning/counseling discussion 06/29/2019   Adverse effect of statin 02/16/2019   Pulsatile tinnitus of both ears 06/29/2018   Thalassemia minor 06/21/2017   Low vitamin B12 level 06/21/2017   Claustrophobia 11/23/2016   Cervical neck pain with evidence of disc disease 01/05/2016   Overweight with body mass index (BMI) 25.0-29.9 06/12/2015   Fatty liver 01/05/2014   DDD (degenerative disc disease), lumbar 01/05/2014   Health maintenance examination 09/21/2013   History of renal cell cancer    Type 2 diabetes mellitus with other  specified complication (HCC)    Hyperlipidemia associated with type 2 diabetes mellitus (HCC)    History of kidney stones    Past Medical History:  Diagnosis Date   Allergy    Anemia    Arthritis    DDD (degenerative disc disease), lumbar 01/2014   severe by CT scan at L4/5 and mod at L3/4 and L5/S1, severe R L4/5 and L L5/S1 neural foraminal narrowing   Diabetes type 2, controlled (HCC) 2010   referred for DSME (05/2013) but pt did not return phone call   Diabetic foot ulcer associated with type 2 diabetes mellitus (HCC) 02/16/2019   Resolved, saw podiatry.   Fatty liver 01/2014   by CT scan   History of diabetic ulcer of foot 05/30/2023   01/2023 saw podiatry     History of kidney stones    uric acid   Hyperlipidemia    Renal cancer (HCC) 2009   part of R kidney removed (Dr. 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   Past Surgical History:  Procedure Laterality Date   COLONOSCOPY     COLONOSCOPY WITH PROPOFOL  N/A 08/15/2023   TAx3 rpt 5 yrs Romero Bi, MD)   KNEE SURGERY Left 1980s   ?meniscus   PARTIAL NEPHRECTOMY Right 07/09/2007   RCC, negative margins   POLYPECTOMY  08/15/2023   Procedure: POLYPECTOMY;  Surgeon: Therisa Bi, MD;  Location: [facility] ENDOSCOPY;  Service: Gastroenterology;;   Social History[1] Family History  Problem Relation Age of Onset   Cancer Mother        lung (smoker)   Diabetes Mother    Cancer Maternal Aunt        lung (smoker)   Cancer Maternal Aunt        lung with met to brain (smoker)   Valvular heart disease Son 30       extra heart valves s/p recent procedure   Hypertension Son    Hyperlipidemia Son    COPD Father    Diabetes Sister    Asthma Brother    Stroke Neg Hx    Allergies[2] Medications Ordered Prior to Encounter[3]  Review of Systems  Constitutional:  Negative for fatigue and fever.  Musculoskeletal:  Positive for  arthralgias and myalgias.  Neurological:  Negative for weakness, numbness and headaches.       Objective:   Physical Exam Constitutional:      General: He is not in acute distress.    Appearance: Normal appearance. He is not ill-appearing.  Cardiovascular:     Rate and Rhythm: Normal rate and regular rhythm.  Pulmonary:     Effort: Pulmonary effort is normal. No  respiratory distress.     Breath sounds: No rales.     Comments: Tender in anterior cw just to right of sternum superiorly  No pain with deep breath   No skin changes, crepitus or step off  Musculoskeletal:     Comments: CS- pain with full extension  Some tenderness in right rhomboid area with tightness Also in right trapezius   Mild tenderness right acromion  Normal rom shoulder with good rom  Neg hawking;s and Neer tests  Good int/ext rotation   Right elbow- tender epicondyle laterally  No swelling or crepitus  Pain with pronation /supination (mild)    Skin:    Findings: No erythema or rash.  Neurological:     Mental Status: He is alert.     Sensory: No sensory deficit.     Motor: No weakness.  Psychiatric:        Mood and Affect: Mood normal.           Assessment & Plan:   Problem List Items Addressed This Visit       Respiratory   Lung nodule   Incidental  Cxr  8 mm at left base  Radiology suggestes correlation with chest CT        Musculoskeletal and Integument   Cervical neck pain with evidence of disc disease   Worse discomfort with extension  Reviewed past notes and imaging   Unclear if this adds to shoulder and elbow pain   Prescription meloxicam  15 mg short course given (with food) Noted last GFR normal   Continues gabapentin  600 mg at bedtime           Other   Right shoulder pain - Primary   Pain is in the shoulder area Painful when arm is dependent but surprisingly good rom on exam , including int/ext rotation  Neg rotator cuff signs   Also neck pain (DDD) Also  some chest wall pain-imaging pending   Xray today noted mild deg changes  Meloxicam  15 mg daily with food/short course (normal GFR noted)  If no improvement would consider sport med eval  Encouraged use of cold compress      Relevant Orders   DG Shoulder Right (Completed)   Right elbow pain   Unclear if this radiates down from his shoulder and neck, but there is tenderness of lateral epicondyle so epicondylitis is in the differential  Very active/including pickle ball   Will try meloxicam  15 mg daily prn (with food) - last GFR normal , short term  Encouraged use of cold compress  Update PT or sport med may be helpful if not improved       Chest wall pain   Pt c/o right sided cw pain and tenderness- just lateral to upper sternum on right  Reassuring exam other than mild tenderness (normal bs and no skin change)  Xray ordered: noting no acute rib changes or acute cardiopulm abn ,  saw 8 mm nodular opacity at left base and suggested CT  ? Strain of muscle or cartilage       Relevant Orders   DG Ribs Unilateral W/Chest Right (Completed)      [1]  Social History Tobacco Use   Smoking status: Never   Smokeless tobacco: Never  Vaping Use   Vaping status: Never Used  Substance Use Topics   Alcohol use: No    Alcohol/week: 0.0 standard drinks of alcohol   Drug use: No  [2]  Allergies Allergen Reactions   Trulicity  [  Dulaglutide ] Other (See Comments)    Itchy welps at injection site to Trulicity , has tolerated Ozempic  well in the past   Glipizide  Other (See Comments)    hypoglycemia   Lipitor [Atorvastatin] Other (See Comments)    Bone aches   Lyrica [Pregabalin] Other (See Comments)    Didn't like how it made him feel, mood swings   Naprosyn  [Naproxen ] Other (See Comments)    Spots before pts eyes   Pravastatin  Other (See Comments)    Body aches and joint pain   Rosuvastatin  Nausea Only    GI upset, abd pain   Codeine Palpitations  [3]  Current Outpatient  Medications on File Prior to Visit  Medication Sig Dispense Refill   Accu-Chek FastClix Lancets MISC 1 each by Does not apply route as directed. Use as directed to check blood sugar 102 each 3   Blood Glucose Monitoring Suppl (ACCU-CHEK NANO SMARTVIEW) w/Device KIT 1 each by Does not apply route as directed. Use as directed to check blood sugar 1 kit 0   Cyanocobalamin  (B-12) 1000 MCG SUBL Place 1 tablet under the tongue every other day.     gabapentin  (NEURONTIN ) 300 MG capsule Take 2 capsules (600 mg total) by mouth at bedtime. 180 capsule 3   glucose blood (ACCU-CHEK GUIDE) test strip Check sugar once daily. DX E11.40 100 strip 3   JARDIANCE 25 MG TABS tablet Take 25 mg by mouth daily.     Semaglutide , 2 MG/DOSE, 8 MG/3ML SOPN Inject 2 mg as directed once a week.     No current facility-administered medications on file prior to visit.   "

## 2024-07-26 NOTE — Assessment & Plan Note (Addendum)
 Unclear if this radiates down from his shoulder and neck, but there is tenderness of lateral epicondyle so epicondylitis is in the differential  Very active/including pickle ball   Will try meloxicam  15 mg daily prn (with food) - last GFR normal , short term  Encouraged use of cold compress  Update PT or sport med may be helpful if not improved

## 2024-07-27 ENCOUNTER — Ambulatory Visit: Attending: Family Medicine

## 2024-07-27 DIAGNOSIS — R0989 Other specified symptoms and signs involving the circulatory and respiratory systems: Secondary | ICD-10-CM

## 2024-07-27 NOTE — Telephone Encounter (Signed)
 Pt seen in office by Dr. Randeen, 08/05/24

## 2024-07-28 NOTE — Assessment & Plan Note (Signed)
 Pt declined CT but may think about it

## 2024-07-29 NOTE — Telephone Encounter (Signed)
 Copied from CRM #[id number]. Topic: Clinical - Lab/Test Results >> Jul 29, 2024  1:27 PM Antwanette L wrote: Reason for CRM: Dagoberto, the patient's wife, is returning a missed call from [place]. I contacted [facility], but [place] was on lunch at the time. Dagoberto is requesting a callback at [phone]

## 2024-07-29 NOTE — Telephone Encounter (Signed)
 Pts wife called back (DPR signed) I did speak with Dagoberto and she said that she and the pt had discussed about going ahead and getting a CT done and pts wife said they would feel better to get a CT done. Request to be done in [place] area and pts wife also request cb to verify that pts insurance will cover the CT. Pts wife said she understands it may take a few days to verify ins and get appt scheduled. Judy request  cb with update if appt is not in near future. Sending note to Dr Randeen and [employer] and Dr KANDICE as PCP.SABRA

## 2024-07-29 NOTE — Telephone Encounter (Signed)
 Copied from CRM [id number]. Topic: Clinical - Request for Lab/Test Order >> Jul 28, 2024  4:23 PM Delon DASEN wrote: Reason for CRM: questions about CT scan- returning call - [phone] wife Dagoberto

## 2024-08-01 NOTE — Addendum Note (Signed)
 Addended by: RANDEEN HARDY A on: 08/01/2024 01:07 PM   Modules accepted: Orders

## 2024-08-12 ENCOUNTER — Ambulatory Visit
Admission: RE | Admit: 2024-08-12 | Discharge: 2024-08-12 | Disposition: A | Source: Ambulatory Visit | Attending: Family Medicine | Admitting: Family Medicine

## 2024-08-12 DIAGNOSIS — R911 Solitary pulmonary nodule: Secondary | ICD-10-CM

## 2024-08-13 ENCOUNTER — Encounter: Payer: Self-pay | Admitting: *Deleted

## 2024-08-13 ENCOUNTER — Encounter: Payer: Self-pay | Admitting: Family Medicine

## 2024-08-13 NOTE — Progress Notes (Signed)
 Blake Manning                                          MRN: [id number]   08/13/2024   The [facility] Quality Team Specialist reviewed this patient medical record for the purposes of chart review for care gap closure. The following were reviewed: abstraction for care gap closure-glycemic status assessment.    [facility] Quality Team

## 2025-06-10 ENCOUNTER — Other Ambulatory Visit

## 2025-06-13 ENCOUNTER — Ambulatory Visit

## 2025-06-17 ENCOUNTER — Encounter: Admitting: Family Medicine
